# Patient Record
Sex: Male | Born: 1970
Health system: Southern US, Community
[De-identification: ages and names within clinical notes are randomized; demographics above are authoritative.]

## PROBLEM LIST (undated history)

## (undated) DIAGNOSIS — F32A Depression, unspecified: Secondary | ICD-10-CM

## (undated) DIAGNOSIS — F329 Major depressive disorder, single episode, unspecified: Secondary | ICD-10-CM

## (undated) DIAGNOSIS — R519 Headache, unspecified: Secondary | ICD-10-CM

## (undated) DIAGNOSIS — Z430 Encounter for attention to tracheostomy: Secondary | ICD-10-CM

## (undated) DIAGNOSIS — R51 Headache: Secondary | ICD-10-CM

## (undated) DIAGNOSIS — D62 Acute posthemorrhagic anemia: Secondary | ICD-10-CM

## (undated) DIAGNOSIS — B019 Varicella without complication: Secondary | ICD-10-CM

## (undated) DIAGNOSIS — Z978 Presence of other specified devices: Secondary | ICD-10-CM

## (undated) DIAGNOSIS — S069XAA Unspecified intracranial injury with loss of consciousness status unknown, initial encounter: Secondary | ICD-10-CM

## (undated) HISTORY — DX: Encounter for attention to tracheostomy: Z43.0

## (undated) HISTORY — DX: Presence of other specified devices: Z97.8

## (undated) HISTORY — DX: Major depressive disorder, single episode, unspecified: F32.9

## (undated) HISTORY — DX: Varicella without complication: B01.9

## (undated) HISTORY — DX: Headache, unspecified: R51.9

## (undated) HISTORY — PX: KNEE SURGERY: SHX244

## (undated) HISTORY — DX: Acute posthemorrhagic anemia: D62

## (undated) HISTORY — DX: Headache: R51

## (undated) HISTORY — PX: INGUINAL HERNIA REPAIR: SUR1180

## (undated) HISTORY — DX: Depression, unspecified: F32.A

---

## 2009-02-21 ENCOUNTER — Ambulatory Visit (HOSPITAL_COMMUNITY): Admission: RE | Admit: 2009-02-21 | Discharge: 2009-02-21 | Payer: Self-pay | Admitting: Family Medicine

## 2016-10-10 ENCOUNTER — Ambulatory Visit: Payer: Self-pay | Admitting: Primary Care

## 2016-10-10 ENCOUNTER — Ambulatory Visit (INDEPENDENT_AMBULATORY_CARE_PROVIDER_SITE_OTHER): Payer: Commercial Managed Care - HMO | Admitting: Primary Care

## 2016-10-10 ENCOUNTER — Encounter: Payer: Self-pay | Admitting: Primary Care

## 2016-10-10 VITALS — BP 124/78 | HR 63 | Temp 97.8°F | Ht 70.0 in | Wt 176.8 lb

## 2016-10-10 DIAGNOSIS — R079 Chest pain, unspecified: Secondary | ICD-10-CM | POA: Insufficient documentation

## 2016-10-10 LAB — CBC
HCT: 42.2 % (ref 39.0–52.0)
Hemoglobin: 14.2 g/dL (ref 13.0–17.0)
MCHC: 33.6 g/dL (ref 30.0–36.0)
MCV: 89.4 fl (ref 78.0–100.0)
PLATELETS: 254 10*3/uL (ref 150.0–400.0)
RBC: 4.73 Mil/uL (ref 4.22–5.81)
RDW: 12.7 % (ref 11.5–15.5)
WBC: 5.4 10*3/uL (ref 4.0–10.5)

## 2016-10-10 LAB — COMPREHENSIVE METABOLIC PANEL
ALBUMIN: 4.4 g/dL (ref 3.5–5.2)
ALK PHOS: 44 U/L (ref 39–117)
ALT: 24 U/L (ref 0–53)
AST: 23 U/L (ref 0–37)
BILIRUBIN TOTAL: 0.7 mg/dL (ref 0.2–1.2)
BUN: 13 mg/dL (ref 6–23)
CALCIUM: 10 mg/dL (ref 8.4–10.5)
CO2: 31 mEq/L (ref 19–32)
CREATININE: 0.8 mg/dL (ref 0.40–1.50)
Chloride: 103 mEq/L (ref 96–112)
GFR: 111.91 mL/min (ref >60.00)
Glucose, Bld: 92 mg/dL (ref 70–99)
Potassium: 5 mEq/L (ref 3.5–5.1)
Sodium: 137 mEq/L (ref 135–145)
TOTAL PROTEIN: 7.4 g/dL (ref 6.0–8.3)

## 2016-10-10 LAB — TSH: TSH: 0.61 u[IU]/mL (ref 0.35–4.50)

## 2016-10-10 NOTE — Progress Notes (Signed)
Pre visit review using our clinic review tool, if applicable. No additional management support is needed unless otherwise documented below in the visit note. 

## 2016-10-10 NOTE — Assessment & Plan Note (Addendum)
Present x 4 months, more consistent now. Could be GERD, unlikely MSK related at this point. Exam today stable. Labs pending. ECG: Sinus bradycardia at 55, no ST abnormality, no BBB. Given symptoms coupled with significant family history of heart disease, will send to cardiology for further work up.

## 2016-10-10 NOTE — Patient Instructions (Addendum)
Complete lab work prior to leaving today. I will notify you of your results once received.   You will be contacted regarding your referral to Cardiology.  Please let us know if you have not heard back within one week.   Your ECG looks good. No bundle branch block or ST abnormality.   Please go to the hospital if your chest pain gets worse, is consistent, you start feeling nauseated and breaking out in sweats.  Please schedule a physical with me in 6 months. You may also schedule a lab only appointment 3-4 days prior. We will discuss your lab results in detail during your physical.  It was a pleasure to meet you today! Please don't hesitate to call me with any questions. Welcome to [facility]!

## 2016-10-10 NOTE — Progress Notes (Signed)
Subjective:    Patient ID: Luke Choi, male    DOB: 04-16-1971, 46 y.o.   MRN: [id number]  HPI  [name] is a 46 year old male who presents today to establish care and discuss the problems mentioned below. Will obtain old records.  1) Chest Tightness/Pressure: The tightness is located to the mid left chest that has been present for the past several months. Initially his tightness was intermittent, over the last month he's noticed more persistent symptoms. He also reports tingling to his left arm and fingers. He does have a history of torn rotator cuff to his left shoulder and experiences intermittent neck pain.   His chest tightness will occur daily andoccasionally with nausea, changes in vision, and lightheadedness. His girlfriend is a paramedic who completed an ECG three weeks ago and noticed bundle branch block. His symptoms will come at rest and exertion that will last for 20-30 minutes. He does notice a heartburn feeling sometimes during these episodes, takes an antacid without improvement.   He denies diaphoresis, shortness of breath, lower extremity edema. He has a strong family history of heart disease: (mother, pacemaker, CAD) father (MI at age 44, bypass surgery, CAD). He quit smoking in his late 20's. He endorses a fair diet and does not regularly exercise. He's not had work up for this since symptoms began, he is not fasting today.   Review of Systems  Constitutional: Negative for unexpected weight change.  Respiratory: Negative for cough and shortness of breath.   Cardiovascular: Positive for chest pain.  Gastrointestinal: Positive for nausea. Negative for abdominal pain.  Musculoskeletal: Positive for arthralgias.  Neurological: Positive for dizziness and light-headedness.       Past Medical History:  Diagnosis Date  . Chickenpox   . Depression   . Frequent headaches      Social History   Social History  . Marital status: Divorced    Spouse name: N/A  . Number  of children: N/A  . Years of education: N/A   Occupational History  . Not on file.   Social History Main Topics  . Smoking status: Former Games developermoker  . Smokeless tobacco: Never Used  . Alcohol use Yes  . Drug use: Unknown  . Sexual activity: Not on file   Other Topics Concern  . Not on file   Social History Narrative   Single.   1 daughter.   Works as an Engineer, maintenanceauto technician.    Enjoys 4-wheeling, camping.    Past Surgical History:  Procedure Laterality Date  . INGUINAL HERNIA REPAIR    . KNEE SURGERY Left    6 times    Family History  Problem Relation Age of Onset  . Arthritis Mother   . Hyperlipidemia Mother   . Hypertension Mother   . Heart disease Mother   . Diabetes Mother   . Alcohol abuse Father   . Arthritis Father   . Prostate cancer Father [id number]  . Hyperlipidemia Father   . Hypertension Father   . Heart disease Father   . Diabetes Father   . Heart attack Father [phone]  . Prostate cancer Paternal Uncle   . Arthritis Maternal Grandmother   . Arthritis Maternal Grandfather   . Arthritis Paternal Grandmother   . Arthritis Paternal Grandfather     No Known Allergies  No current outpatient prescriptions on file prior to visit.   No current facility-administered medications on file prior to visit.     BP 124/78   Pulse 63  Temp 97.8 F (36.6 C) (Oral)   Ht 5\' 10"  (1.778 m)   Wt 176 lb 12.8 oz (80.2 kg)   SpO2 98%   BMI 25.37 kg/m    Objective:   Physical Exam  Constitutional: He is oriented to person, place, and time. He appears well-nourished.  Neck: Neck supple.  Cardiovascular: Normal rate, regular rhythm and normal heart sounds.   No lower extremity edema  Pulmonary/Chest: Effort normal and breath sounds normal. He has no wheezes. He has no rales.  Neurological: He is alert and oriented to person, place, and time.  Skin: Skin is warm and dry.  Psychiatric: He has a normal mood and affect.          Assessment & Plan:

## 2016-10-16 ENCOUNTER — Ambulatory Visit: Payer: Self-pay | Admitting: Primary Care

## 2016-10-30 ENCOUNTER — Ambulatory Visit (INDEPENDENT_AMBULATORY_CARE_PROVIDER_SITE_OTHER): Payer: Commercial Managed Care - HMO | Admitting: Internal Medicine

## 2016-10-30 VITALS — BP 116/64 | HR 63 | Ht 72.0 in | Wt 183.0 lb

## 2016-10-30 DIAGNOSIS — Z13220 Encounter for screening for lipoid disorders: Secondary | ICD-10-CM | POA: Insufficient documentation

## 2016-10-30 DIAGNOSIS — R079 Chest pain, unspecified: Secondary | ICD-10-CM | POA: Diagnosis not present

## 2016-10-30 DIAGNOSIS — Z8249 Family history of ischemic heart disease and other diseases of the circulatory system: Secondary | ICD-10-CM | POA: Insufficient documentation
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
## 2016-10-30 NOTE — Patient Instructions (Addendum)
Your physician has requested that you have an exercise tolerance test. For further information please visit [email or web address]. Please also follow instruction sheet, as given.  Your physician recommends that you return for lab work FASTING @ [facility]  Your physician recommends that you schedule a follow-up appointment after your stress test.

## 2016-11-14 ENCOUNTER — Telehealth (HOSPITAL_COMMUNITY): Payer: Self-pay

## 2016-11-14 NOTE — Telephone Encounter (Signed)
I did notify Rene Kocher in scheduling to cancel appointment. Encounter complete.

## 2016-11-19 ENCOUNTER — Inpatient Hospital Stay (HOSPITAL_COMMUNITY): Admission: RE | Admit: 2016-11-19 | Payer: Commercial Managed Care - HMO | Source: Ambulatory Visit

## 2016-11-21 ENCOUNTER — Ambulatory Visit: Payer: Commercial Managed Care - HMO | Admitting: Internal Medicine

## 2016-11-21 ENCOUNTER — Encounter: Payer: Self-pay | Admitting: *Deleted

## 2016-11-25 ENCOUNTER — Encounter: Payer: Self-pay | Admitting: Primary Care

## 2017-02-02 ENCOUNTER — Ambulatory Visit (INDEPENDENT_AMBULATORY_CARE_PROVIDER_SITE_OTHER)
Admission: RE | Admit: 2017-02-02 | Discharge: 2017-02-02 | Disposition: A | Discharge status: Home / Self Care | Payer: 59 | Source: Ambulatory Visit | Attending: Primary Care | Admitting: Primary Care

## 2017-02-02 ENCOUNTER — Ambulatory Visit (INDEPENDENT_AMBULATORY_CARE_PROVIDER_SITE_OTHER): Payer: 59 | Admitting: Primary Care

## 2017-02-02 ENCOUNTER — Encounter: Payer: Self-pay | Admitting: Primary Care

## 2017-02-02 VITALS — BP 122/72 | HR 67 | Temp 98.0°F | Ht 70.0 in | Wt 177.1 lb

## 2017-02-02 DIAGNOSIS — R079 Chest pain, unspecified: Secondary | ICD-10-CM | POA: Diagnosis not present

## 2017-02-02 DIAGNOSIS — G4700 Insomnia, unspecified: Secondary | ICD-10-CM | POA: Insufficient documentation

## 2017-02-02 DIAGNOSIS — R5383 Other fatigue: Secondary | ICD-10-CM | POA: Insufficient documentation

## 2017-02-02 DIAGNOSIS — R61 Generalized hyperhidrosis: Secondary | ICD-10-CM

## 2017-02-02 LAB — CBC WITH DIFFERENTIAL/PLATELET
BASOS ABS: 0.1 10*3/uL (ref 0.0–0.1)
Basophils Relative: 1.4 % (ref 0.0–3.0)
EOS ABS: 0.1 10*3/uL (ref 0.0–0.7)
Eosinophils Relative: 2.2 % (ref 0.0–5.0)
HCT: 41.2 % (ref 39.0–52.0)
Hemoglobin: 13.9 g/dL (ref 13.0–17.0)
LYMPHS ABS: 1.3 10*3/uL (ref 0.7–4.0)
Lymphocytes Relative: 21.1 % (ref 12.0–46.0)
MCHC: 33.9 g/dL (ref 30.0–36.0)
MCV: 89.5 fl (ref 78.0–100.0)
MONO ABS: 0.5 10*3/uL (ref 0.1–1.0)
Monocytes Relative: 8.9 % (ref 3.0–12.0)
NEUTROS ABS: 3.9 10*3/uL (ref 1.4–7.7)
NEUTROS PCT: 66.4 % (ref 43.0–77.0)
PLATELETS: 271 10*3/uL (ref 150.0–400.0)
RBC: 4.6 Mil/uL (ref 4.22–5.81)
RDW: 12.8 % (ref 11.5–15.5)
WBC: 5.9 10*3/uL (ref 4.0–10.5)

## 2017-02-02 LAB — LIPID PANEL
CHOL/HDL RATIO: 2
Cholesterol: 179 mg/dL (ref 0–200)
HDL: 79.3 mg/dL (ref >39.00)
LDL CALC: 87 mg/dL (ref 0–99)
NONHDL: 99.78
TRIGLYCERIDES: 63 mg/dL (ref 0.0–149.0)
VLDL: 12.6 mg/dL (ref 0.0–40.0)

## 2017-02-02 LAB — COMPREHENSIVE METABOLIC PANEL
ALT: 23 U/L (ref 0–53)
AST: 21 U/L (ref 0–37)
Albumin: 4.7 g/dL (ref 3.5–5.2)
Alkaline Phosphatase: 50 U/L (ref 39–117)
BILIRUBIN TOTAL: 0.5 mg/dL (ref 0.2–1.2)
BUN: 11 mg/dL (ref 6–23)
CO2: 31 meq/L (ref 19–32)
Calcium: 10 mg/dL (ref 8.4–10.5)
Chloride: 100 mEq/L (ref 96–112)
Creatinine, Ser: 0.87 mg/dL (ref 0.40–1.50)
GFR: 101.43 mL/min (ref >60.00)
GLUCOSE: 94 mg/dL (ref 70–99)
Potassium: 4.5 mEq/L (ref 3.5–5.1)
SODIUM: 136 meq/L (ref 135–145)
TOTAL PROTEIN: 7.4 g/dL (ref 6.0–8.3)

## 2017-02-02 LAB — HEMOGLOBIN A1C: Hgb A1c MFr Bld: 5.3 % (ref 4.6–6.5)

## 2017-02-02 MED ORDER — TRAZODONE HCL 50 MG PO TABS: 25.0000 mg | ORAL_TABLET | Freq: Every evening | ORAL | 0 refills | Status: DC | PRN

## 2017-02-02 NOTE — Assessment & Plan Note (Signed)
With night sweats, insomnia, nausea nearly every morning for years. Exam today unremarkable. Does drink a moderate amount of alcohol. Check labs and chest xray today. Will treat insomnia with low dose Trazodone, he will update if no improvement. Strongly encouraged he follow up with cardiology given family history of heart disease. Check lipids today.

## 2017-02-02 NOTE — Progress Notes (Signed)
Subjective:    Patient ID: Luke Choi, male    DOB: Jul 26, 1971, 46 y.o.   MRN: [id number]  HPI  [name] is a 46 year old male who presents today with a chief complaint of insomnia. He also reports night sweats. His symptoms began Tuesday last week and lasted until Thursday. He will wake up feeling tired, nauseated, in cold sweats. This has been intermittent for the past 2 years and has progressively increased over the past 1 year. He stopped smoking 20 years ago. He drinks 1-2, 12 ounce cans of beer 5-6 days weekly. He does still experience left sided chest pain, and occasional shortness of breath, most mornings of the week. He wakes up most everyday feeling "horrible".  He goes to bed at 10:30 pm at night, wakes between 3am-4am, sometimes he doesn't sleep at all. He's had difficulty falling and staying asleep since childhood. He's tried Ambien (no improvement), amitriptyline (felt groggy the next day), Xanax (sometimes worked), Melatonin (not effective), Aleve PM (groggy feeling the next day). He took some of his girlfriends Trazodone several days ago with improvement.   He woke up Friday morning last week, checked his blood sugar which was 107. He denies unexplained weight loss, cough, rectal bleeding, fevers, abdominal pain. He's able to eat and drink without difficulty.  He was evaluated by cardiology in March 2018 and was recommended to undergo exercise stress test which was not covered by his insurance. He has not followed back up with the cardiologist since.  Review of Systems  Constitutional: Positive for fatigue. Negative for fever and unexpected weight change.  HENT: Negative for congestion.   Respiratory: Positive for shortness of breath. Negative for cough.   Cardiovascular: Positive for chest pain.  Gastrointestinal: Positive for nausea. Negative for abdominal pain, blood in stool and vomiting.  Endocrine: Negative for polyuria.  Neurological: Negative for dizziness and  headaches.  Hematological: Negative for adenopathy.       Past Medical History:  Diagnosis Date  . Chickenpox   . Depression   . Frequent headaches      Social History   Social History  . Marital status: Divorced    Spouse name: N/A  . Number of children: N/A  . Years of education: N/A   Occupational History  . Not on file.   Social History Main Topics  . Smoking status: Former Games developer  . Smokeless tobacco: Never Used  . Alcohol use Yes  . Drug use: Unknown  . Sexual activity: Not on file   Other Topics Concern  . Not on file   Social History Narrative   Single.   1 daughter.   Works as an Engineer, maintenance.    Enjoys 4-wheeling, camping.    Past Surgical History:  Procedure Laterality Date  . INGUINAL HERNIA REPAIR    . KNEE SURGERY Left    6 times    Family History  Problem Relation Age of Onset  . Arthritis Mother   . Hyperlipidemia Mother   . Hypertension Mother   . Heart disease Mother   . Diabetes Mother   . Alcohol abuse Father   . Arthritis Father   . Prostate cancer Father 75  . Hyperlipidemia Father   . Hypertension Father   . Heart disease Father   . Diabetes Father   . Heart attack Father 47  . Prostate cancer Paternal Uncle   . Arthritis Maternal Grandmother   . Arthritis Maternal Grandfather   . Arthritis Paternal Grandmother   .  Arthritis Paternal Grandfather     No Known Allergies  No current outpatient prescriptions on file prior to visit.   No current facility-administered medications on file prior to visit.     BP 122/72   Pulse 67   Temp 98 F (36.7 C) (Oral)   Ht 5\' 10"  (1.778 m)   Wt 177 lb 1.9 oz (80.3 kg)   SpO2 98%   BMI 25.41 kg/m    Objective:   Physical Exam  Constitutional: He appears well-nourished.  Neck: Neck supple.  Cardiovascular: Normal rate and regular rhythm.   Pulmonary/Chest: Effort normal and breath sounds normal.  Abdominal: Soft. Bowel sounds are normal. There is no tenderness.  Skin:  Skin is warm and dry.  Psychiatric: He has a normal mood and affect.          Assessment & Plan:

## 2017-02-02 NOTE — Patient Instructions (Addendum)
Complete lab work and xray prior to leaving today. I will notify you of your results once received.   Please call the cardiologists office and explain that you continue to experience your symptoms.  Start trazodone tablets as needed for insomnia. Take 1/2 to 1 tablet by mouth at bedtime as needed for insomnia. Please notify me if no improvement in 2 weeks.  It was a pleasure to see you today!

## 2017-02-02 NOTE — Assessment & Plan Note (Signed)
Continues intermittently. Evaluated by cardiology, insurance wouldn't cover exercise stress test. Recommended he contact his cardiologist to learn if there are payment plans or any other options.  Check labs and chest xray today.

## 2017-02-02 NOTE — Assessment & Plan Note (Signed)
Long history of since childhood and has failed numerous treatments in the past. Discussed good bedtime hygiene. Will start low dose Trazodone, advance as tolerated. He will update if no improvement.

## 2017-02-03 ENCOUNTER — Encounter: Payer: Self-pay | Admitting: Primary Care

## 2017-02-03 LAB — HIV ANTIBODY (ROUTINE TESTING W REFLEX): HIV: NONREACTIVE

## 2017-02-03 NOTE — Telephone Encounter (Signed)
Please set patient up for nurse visit for spirometry both pre and post albuterol.

## 2017-02-05 ENCOUNTER — Ambulatory Visit (INDEPENDENT_AMBULATORY_CARE_PROVIDER_SITE_OTHER): Payer: 59 | Admitting: *Deleted

## 2017-02-05 DIAGNOSIS — R5383 Other fatigue: Secondary | ICD-10-CM

## 2017-02-05 MED ORDER — ALBUTEROL SULFATE (2.5 MG/3ML) 0.083% IN NEBU
2.5000 mg | INHALATION_SOLUTION | Freq: Once | RESPIRATORY_TRACT | Status: AC
  Administered 2017-02-05: 2.5 mg via RESPIRATORY_TRACT

## 2017-02-05 NOTE — Telephone Encounter (Signed)
Nurse visit on 02/05/2017

## 2017-02-09 ENCOUNTER — Encounter: Payer: Self-pay | Admitting: Primary Care

## 2017-02-09 DIAGNOSIS — R071 Chest pain on breathing: Secondary | ICD-10-CM

## 2017-02-10 ENCOUNTER — Observation Stay (HOSPITAL_COMMUNITY)
Admission: EM | Admit: 2017-02-10 | Discharge: 2017-02-11 | Disposition: A | Discharge status: Home / Self Care | Payer: 59 | Attending: Cardiology | Admitting: Cardiology

## 2017-02-10 ENCOUNTER — Emergency Department (HOSPITAL_COMMUNITY): Payer: 59

## 2017-02-10 ENCOUNTER — Encounter (HOSPITAL_COMMUNITY): Payer: Self-pay | Admitting: Emergency Medicine

## 2017-02-10 DIAGNOSIS — R0602 Shortness of breath: Secondary | ICD-10-CM | POA: Insufficient documentation

## 2017-02-10 DIAGNOSIS — R001 Bradycardia, unspecified: Secondary | ICD-10-CM | POA: Diagnosis not present

## 2017-02-10 DIAGNOSIS — R0789 Other chest pain: Secondary | ICD-10-CM

## 2017-02-10 DIAGNOSIS — Z87891 Personal history of nicotine dependence: Secondary | ICD-10-CM | POA: Insufficient documentation

## 2017-02-10 DIAGNOSIS — R079 Chest pain, unspecified: Secondary | ICD-10-CM | POA: Diagnosis present

## 2017-02-10 DIAGNOSIS — R200 Anesthesia of skin: Secondary | ICD-10-CM | POA: Insufficient documentation

## 2017-02-10 LAB — BASIC METABOLIC PANEL
ANION GAP: 8 (ref 5–15)
BUN: 10 mg/dL (ref 6–20)
CO2: 25 mmol/L (ref 22–32)
Calcium: 9.1 mg/dL (ref 8.9–10.3)
Chloride: 104 mmol/L (ref 101–111)
Creatinine, Ser: 0.8 mg/dL (ref 0.61–1.24)
GLUCOSE: 103 mg/dL — AB (ref 65–99)
POTASSIUM: 4.1 mmol/L (ref 3.5–5.1)
Sodium: 137 mmol/L (ref 135–145)

## 2017-02-10 LAB — I-STAT TROPONIN, ED
TROPONIN I, POC: 0 ng/mL (ref 0.00–0.08)
Troponin i, poc: 0 ng/mL (ref 0.00–0.08)

## 2017-02-10 LAB — CBC
HCT: 39.6 % (ref 39.0–52.0)
HEMATOCRIT: 41.2 % (ref 39.0–52.0)
HEMOGLOBIN: 13.6 g/dL (ref 13.0–17.0)
HEMOGLOBIN: 13 g/dL (ref 13.0–17.0)
MCH: 29.2 pg (ref 26.0–34.0)
MCH: 29.1 pg (ref 26.0–34.0)
MCHC: 33 g/dL (ref 30.0–36.0)
MCHC: 32.8 g/dL (ref 30.0–36.0)
MCV: 88.4 fL (ref 78.0–100.0)
MCV: 88.6 fL (ref 78.0–100.0)
Platelets: 261 10*3/uL (ref 150–400)
Platelets: 251 10*3/uL (ref 150–400)
RBC: 4.66 MIL/uL (ref 4.22–5.81)
RBC: 4.47 MIL/uL (ref 4.22–5.81)
RDW: 12.5 % (ref 11.5–15.5)
RDW: 12.4 % (ref 11.5–15.5)
WBC: 4.9 10*3/uL (ref 4.0–10.5)
WBC: 6.3 10*3/uL (ref 4.0–10.5)

## 2017-02-10 LAB — CREATININE, SERUM
Creatinine, Ser: 0.74 mg/dL (ref 0.61–1.24)
GFR calc Af Amer: >60 mL/min (ref >60)
GFR calc non Af Amer: >60 mL/min (ref >60)

## 2017-02-10 LAB — PROTIME-INR
INR: 1.07
Prothrombin Time: 13.9 seconds (ref 11.4–15.2)

## 2017-02-10 LAB — TROPONIN I

## 2017-02-10 MED ORDER — SODIUM CHLORIDE 0.9% FLUSH: 3.0000 mL | Freq: Two times a day (BID) | INTRAVENOUS | Status: DC

## 2017-02-10 MED ORDER — ENOXAPARIN SODIUM 40 MG/0.4ML ~~LOC~~ SOLN
40.0000 mg | SUBCUTANEOUS | Status: DC
  Administered 2017-02-10: 40 mg via SUBCUTANEOUS
  Filled 2017-02-10: qty 0.4

## 2017-02-10 MED ORDER — NITROGLYCERIN 0.4 MG SL SUBL
0.4000 mg | SUBLINGUAL_TABLET | SUBLINGUAL | Status: DC | PRN
Start: 2017-02-10 — End: 2017-02-11

## 2017-02-10 MED ORDER — SODIUM CHLORIDE 0.9% FLUSH: 3.0000 mL | INTRAVENOUS | Status: DC | PRN

## 2017-02-10 MED ORDER — ASPIRIN EC 81 MG PO TBEC
81.0000 mg | DELAYED_RELEASE_TABLET | Freq: Every day | ORAL | Status: DC
  Administered 2017-02-11: 81 mg via ORAL
  Filled 2017-02-10: qty 1

## 2017-02-10 MED ORDER — SODIUM CHLORIDE 0.9 % IV SOLN: 250.0000 mL | INTRAVENOUS | Status: DC | PRN

## 2017-02-10 MED ORDER — ACETAMINOPHEN 325 MG PO TABS
650.0000 mg | ORAL_TABLET | ORAL | Status: DC | PRN
  Administered 2017-02-10: 650 mg via ORAL
  Filled 2017-02-10: qty 2

## 2017-02-10 MED ORDER — ONDANSETRON HCL 4 MG/2ML IJ SOLN: 4.0000 mg | Freq: Four times a day (QID) | INTRAMUSCULAR | Status: DC | PRN

## 2017-02-10 MED ORDER — TRAZODONE HCL 50 MG PO TABS
25.0000 mg | ORAL_TABLET | Freq: Every evening | ORAL | Status: DC | PRN
  Filled 2017-02-10: qty 1

## 2017-02-10 NOTE — ED Provider Notes (Signed)
[facility]-EMERGENCY DEPT Provider Note   CSN: [id number] Arrival date & time: 02/10/17  0808     History   Chief Complaint Chief Complaint  Patient presents with  . Chest Pain  . Shortness of Breath  . Numbness    in left arm.    HPI Luke Choi is a 46 y.o. male.  HPI  Pt presenting with c/o chest pain.  Pt states that he has been having chest pain intermitently over the past 6 months.  Was seen by Dr. Rennis GoldenHilty, cardiology, who recommended stress testing. Pt states insurance would not cover this test.  This morning he was at work and carrying a heavy cooler and felt sharp chest pain. He also was diaphoretic and felt faint.  He also had some trouble catching his breath.  No leg swelling. No fainting.  No radiation of pain.  No hx of DVT/PE, no recent travel/trauma/surgery.  There are no other associated systemic symptoms, there are no other alleviating or modifying factors.   Past Medical History:  Diagnosis Date  . Chickenpox   . Depression   . Frequent headaches     Patient Active Problem List   Diagnosis Date Noted  . Junctional bradycardia 02/11/2017  . Fatigue 02/02/2017  . Insomnia 02/02/2017  . Family history of heart disease 10/30/2016  . Screening for lipid disorders 10/30/2016  . Chest pain 10/10/2016    Past Surgical History:  Procedure Laterality Date  . INGUINAL HERNIA REPAIR    . KNEE SURGERY Left    6 times       Home Medications    Prior to Admission medications   Medication Sig Start Date End Date Taking? Authorizing Provider  naproxen sodium (ALEVE) 220 MG tablet Take 220 mg by mouth daily as needed (pain).   Yes [provider]  traZODone (DESYREL) 50 MG tablet Take 0.5-1 tablets (25-50 mg total) by mouth at bedtime as needed for sleep. 02/02/17  Yes Doreene Nestlark, Katherine K, NP  aspirin EC 81 MG EC tablet Take 1 tablet (81 mg total) by mouth daily. 02/11/17   Barrett, Joline Salthonda G, PA-C  nitroGLYCERIN (NITROSTAT) 0.4 MG SL tablet Place 1 tablet (0.4 mg  total) under the tongue every 5 (five) minutes x 3 doses as needed for chest pain. 02/11/17   Barrett, Joline Salthonda G, PA-C    Family History Family History  Problem Relation Age of Onset  . Arthritis Mother   . Hyperlipidemia Mother   . Hypertension Mother   . Heart disease Mother   . Diabetes Mother   . Alcohol abuse Father   . Arthritis Father   . Prostate cancer Father [id number]  . Hyperlipidemia Father   . Hypertension Father   . Heart disease Father   . Diabetes Father   . Heart attack Father 1741  . Prostate cancer Paternal Uncle   . Arthritis Maternal Grandmother   . Arthritis Maternal Grandfather   . Arthritis Paternal Grandmother   . Arthritis Paternal Grandfather     Social History Social History  Substance Use Topics  . Smoking status: Former Games developermoker  . Smokeless tobacco: Never Used  . Alcohol use Yes     Allergies   Patient has no known allergies.   Review of Systems Review of Systems  ROS reviewed and all otherwise negative except for mentioned in HPI   Physical Exam Updated Vital Signs BP (!) 95/53 (BP Location: Right Arm)   Pulse 67   Temp 98.2 F (36.8 C) (Oral)  Resp 20   Ht 5\' 10"  (1.778 m)   Wt 80.3 kg (177 lb)   SpO2 99%   BMI 25.40 kg/m  Vitals reviewed Physical Exam  Physical Examination: General appearance - alert, well appearing, and in no distress Mental status - alert, oriented to person, place, and time Eyes - no conjunctival injection no scleral icterus Mouth - mucous membranes moist, pharynx normal without lesions Chest - clear to auscultation, no wheezes, rales or rhonchi, symmetric air entry Heart - normal rate, regular rhythm, normal S1, S2, no murmurs, rubs, clicks or gallops Abdomen - soft, nontender, nondistended, no masses or organomegaly Neurological - alert, oriented, normal speech Extremities - peripheral pulses normal, no pedal edema, no clubbing or cyanosis Skin - normal coloration and turgor, no rashes   ED Treatments /  Results  Labs (all labs ordered are listed, but only abnormal results are displayed) Labs Reviewed  BASIC METABOLIC PANEL - Abnormal; Notable for the following:       Result Value   Glucose, Bld 103 (*)    All other components within normal limits  CBC  PROTIME-INR  CBC  CREATININE, SERUM  CBC  PROTIME-INR  BASIC METABOLIC PANEL  TROPONIN I  TROPONIN I  TROPONIN I  I-STAT TROPOININ, ED  I-STAT TROPOININ, ED    EKG  EKG Interpretation  Date/Time:  Tuesday February 10 2017 08:10:58 EDT Ventricular Rate:  82 PR Interval:  138 QRS Duration: 86 QT Interval:  380 QTC Calculation: 443 R Axis:   83 Text Interpretation:  Normal sinus rhythm Septal infarct , age undetermined Abnormal ECG No old tracing to compare Confirmed by Jerelyn Scott [phone]) on 02/10/2017 9:33:43 AM       Radiology No results found.  Procedures Procedures (including critical care time)  Medications Ordered in ED Medications  technetium tetrofosmin (TC-MYOVIEW) injection 10 millicurie (10 millicuries Intravenous Contrast Given 02/11/17 0900)  nitroGLYCERIN (NITROSTAT) SL tablet 0.4 mg (0.4 mg Sublingual Given 02/11/17 1047)  technetium tetrofosmin (TC-MYOVIEW) injection 30 millicurie (30 millicuries Intravenous Contrast Given 02/11/17 1040)     Initial Impression / Assessment and Plan / ED Course  I have reviewed the triage vital signs and the nursing notes.  Pertinent labs & imaging results that were available during my care of the patient were reviewed by me and considered in my medical decision making (see chart for details).    10:38 AM d/w cardiology, they will consult on patient in the ED.   Pt presenting with ongoing episdoes of chest pain over the past few months.  EKG and troponin reassuring today.  D/w cardiology- they have consulted and plan to admit for further testing.    Final Clinical Impressions(s) / ED Diagnoses   Final diagnoses:  Chest pain    New Prescriptions Discharge  Medication List as of 02/11/2017  5:20 PM    START taking these medications   Details  aspirin EC 81 MG EC tablet Take 1 tablet (81 mg total) by mouth daily., Starting Wed 02/11/2017, OTC    nitroGLYCERIN (NITROSTAT) 0.4 MG SL tablet Place 1 tablet (0.4 mg total) under the tongue every 5 (five) minutes x 3 doses as needed for chest pain., Starting Wed 02/11/2017, Normal         Jerelyn Scott, MD 02/14/17 1327

## 2017-02-10 NOTE — H&P (Signed)
Patient ID: Luke Choi MRN: [id number], DOB/AGE: 44-Oct-1972   Admit date: 02/10/2017   Primary Physician: Doreene Nest, NP Primary Cardiologist: Dr. Rennis Golden  Reason for admission: chest pain   HPI:  Luke Choi is a 46 y.o. male with a history of former tobacco abuse and strong family history of CAD who presented to [facility] ED today with chest pain.  He has a strong family history of heart disease. His father had his first MI in his 22s. He has about a 7 pack year history of smoking and quit about 20 years ago. 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   He was seen by Dr. Rennis Golden in the office on 10/30/16 for evaluation of chest pain. His girlfriend is a paramedic and previously had performed an EKG on him which showed a right bundle branch block. However, subsequent EKGs have not shown a right bundle branch block. Dr. Rennis Golden felt his pain had typical and atypical features. He also felt that there was possibly some stress from family issues that may be contributing. He had tried treatment for GERD but did not notice any improvement. He ordered a plain old exercise stress test, which has not been completed yet. Fasting lipid panel showed LDL cholesterol of 87.  Patient has continued to have intermittent chest pain since seeing Dr. Rennis Golden. He said that his insurance would not cover the stress test so he was unable to get it done. He has had chest pain for the past couple years but it has been worsening in frequency and severity. This morning he was pushing a cooler up an incline and had sudden onset of central sharp chest pain associated with diaphoresis and SOB. He also notes waking up in the mornings just not feeling well and having cold sweats. He denies LE edema, orthopnea or PND. No dizziness or syncope. No palpitations.    Problem List  Past Medical History:  Diagnosis Date  . Chickenpox   . Depression   . Frequent headaches     Past Surgical History:    Procedure Laterality Date  . INGUINAL HERNIA REPAIR    . KNEE SURGERY Left    6 times     Allergies  No Known Allergies   Home Medications  Prior to Admission medications   Medication Sig Start Date End Date Taking? Authorizing Provider  naproxen sodium (ALEVE) 220 MG tablet Take 220 mg by mouth daily as needed (pain).   Yes [provider]  traZODone (DESYREL) 50 MG tablet Take 0.5-1 tablets (25-50 mg total) by mouth at bedtime as needed for sleep. 02/02/17  Yes Doreene Nest, NP    Family History  Family History  Problem Relation Age of Onset  . Arthritis Mother   . Hyperlipidemia Mother   . Hypertension Mother   . Heart disease Mother   . Diabetes Mother   . Alcohol abuse Father   . Arthritis Father   . Prostate cancer Father 10  . Hyperlipidemia Father   . Hypertension Father   . Heart disease Father   . Diabetes Father   . Heart attack Father 47  . Prostate cancer Paternal Uncle   . Arthritis Maternal Grandmother   . Arthritis Maternal Grandfather   . Arthritis Paternal Grandmother   . Arthritis Paternal Grandfather    Family Status  Relation Status  . Mother (Not Specified)  . Father (Not Specified)  . Oneal Grout (Not Specified)  .  MGM (Not Specified)  . MGF (Not Specified)  . PGM (Not Specified)  . PGF (Not Specified)     Social History  Social History   Social History  . Marital status: Divorced    Spouse name: N/A  . Number of children: N/A  . Years of education: N/A   Occupational History  . Not on file.   Social History Main Topics  . Smoking status: Former Games developer  . Smokeless tobacco: Never Used  . Alcohol use Yes  . Drug use: Unknown  . Sexual activity: Not on file   Other Topics Concern  . Not on file   Social History Narrative   Single.   1 daughter.   Works as an Engineer, maintenance.    Enjoys 4-wheeling, camping.     Review of Systems General:  No chills, fever, +++night sweats , no weight changes.   Cardiovascular:  +++ chest pain, +++dyspnea on exertion, NO edema, orthopnea, palpitations, paroxysmal nocturnal dyspnea. Dermatological: No rash, lesions/masses Respiratory: No cough, dyspnea Urologic: No hematuria, dysuria Abdominal:   +++nausea, No vomiting, diarrhea, bright red blood per rectum, melena, or hematemesis Neurologic:  No visual changes, wkns, changes in mental status. All other systems reviewed and are otherwise negative except as noted above.  Physical Exam  Blood pressure 126/81, pulse (!) 59, resp. rate 16, height 5\' 10"  (1.778 m), weight 177 lb (80.3 kg), SpO2 100 %.  General: Pleasant, NAD Psych: Normal affect. Neuro: Alert and oriented X 3. Moves all extremities spontaneously. HEENT: Normal  Neck: Supple without bruits or JVD. Lungs:  Resp regular and unlabored, CTA. Heart: RRR no s3, s4, or murmurs. Abdomen: Soft, non-tender, non-distended, BS + x 4.  Extremities: No clubbing, cyanosis or edema. DP/PT/Radials 2+ and equal bilaterally.  Labs  No results for input(s): CKTOTAL, CKMB, TROPONINI in the last 72 hours. Lab Results  Component Value Date   WBC 4.9 02/10/2017   HGB 13.6 02/10/2017   HCT 41.2 02/10/2017   MCV 88.4 02/10/2017   PLT 261 02/10/2017    Recent Labs Lab 02/10/17 0823  NA 137  K 4.1  CL 104  CO2 25  BUN 10  CREATININE 0.80  CALCIUM 9.1  GLUCOSE 103*   Lab Results  Component Value Date   CHOL 179 02/02/2017   HDL 79.30 02/02/2017   LDLCALC 87 02/02/2017   TRIG 63.0 02/02/2017   No results found for: DDIMER   Radiology/Studies  Dg Chest 2 View  Result Date: 02/10/2017 CLINICAL DATA:  Shortness of breath and chest pain EXAM: CHEST  2 VIEW COMPARISON:  February 02, 2017 FINDINGS: Lungs are clear. The heart size and pulmonary vascularity are normal. No adenopathy. No bone lesions. No pneumothorax. IMPRESSION: No edema or consolidation. Electronically Signed   By: Bretta Bang III M.D.   On: 02/10/2017 08:44   Dg Chest 2  View  Result Date: 02/02/2017 CLINICAL DATA:  Chest pain and shortness of breath for 2 years, intermittent but worse now, former smoker EXAM: CHEST  2 VIEW COMPARISON:  02/21/2009 FINDINGS: Normal heart size, mediastinal contours, and pulmonary vascularity. Central peribronchial thickening and hyperinflation question COPD. No acute infiltrate, pleural effusion or pneumothorax. Osseous structures unremarkable. IMPRESSION: Chronic bronchitic changes and hyperinflation question COPD. No acute infiltrate. Electronically Signed   By: Ulyses Southward M.D.   On: 02/02/2017 11:25    ECG  NSR HR 82, possible anterior Q waves.  ASSESSMENT AND PLAN  Chest pain: sharp exertional chest pain. Also worse with  a deep breath in. Pretty atypical. No objective signs of ischemia with negative cardiac enzymes and non acute ECG. Will plan for overnight admission rule out and ETT myoview in the morning.    Signed, Luke CrockKathryn Thompson, PA-C 02/10/2017, 10:29 AM  Pager [phone] As above, pt seen and examined; briefly he is a 46 yo male for evaluation of CP. Patient has had occasional pain in his left breast area for 3 years. It is described as a pressure and can last all day at times. It can also be burning sensation at time. It is not exertional, pleuritic or related to food. There is associated dyspnea. Resolves spontaneously. Today while pushing a cart he developed sudden onset of severe pain in the substernal area with tingling in his left upper extremity. There was nausea but no vomiting. There is dyspnea as well. The pain lasted minutes and resolved. He is presently pain-free. He denies orthopnea, PND or pedal edema. Electrocardiogram shows sinus rhythm with no ST changes. Initial enzymes negative.  1 chest pain-symptoms are atypical but family history of coronary disease in his father who had a myocardial infarction in his 4440s. We will admit and cycle enzymes. If negative plan stress nuclear study for risk  stratification.  Luke MillersBrian Crenshaw, MD

## 2017-02-10 NOTE — ED Notes (Signed)
Report given at 3W

## 2017-02-10 NOTE — ED Triage Notes (Signed)
Pt. Stated, I was walking up to work with a cooler and I felt some sharp pain in my chest and could not catch my breath. I've had several episodes of having chest pain but this morning it was really sharp

## 2017-02-10 NOTE — ED Notes (Signed)
Patient family requesting to speak wih EDP. EDp made aware.

## 2017-02-10 NOTE — Plan of Care (Signed)
Problem: Skin Integrity: Goal: Risk for impaired skin integrity will decrease Outcome: Progressing Skin assessed, no areas of redness or breakdown noted.  Patient is able to reposition self and does so frequently.  Patient understands to report any changes in skin integrity to nurse.  Patient progressing towards goal.

## 2017-02-11 ENCOUNTER — Observation Stay (HOSPITAL_BASED_OUTPATIENT_CLINIC_OR_DEPARTMENT_OTHER): Payer: 59

## 2017-02-11 DIAGNOSIS — Z87891 Personal history of nicotine dependence: Secondary | ICD-10-CM | POA: Diagnosis not present

## 2017-02-11 DIAGNOSIS — R200 Anesthesia of skin: Secondary | ICD-10-CM | POA: Diagnosis not present

## 2017-02-11 DIAGNOSIS — R079 Chest pain, unspecified: Secondary | ICD-10-CM | POA: Diagnosis not present

## 2017-02-11 DIAGNOSIS — R001 Bradycardia, unspecified: Secondary | ICD-10-CM | POA: Diagnosis not present

## 2017-02-11 DIAGNOSIS — R0602 Shortness of breath: Secondary | ICD-10-CM | POA: Diagnosis not present

## 2017-02-11 LAB — BASIC METABOLIC PANEL
Anion gap: 6 (ref 5–15)
BUN: 12 mg/dL (ref 6–20)
CALCIUM: 9 mg/dL (ref 8.9–10.3)
CO2: 24 mmol/L (ref 22–32)
Chloride: 106 mmol/L (ref 101–111)
Creatinine, Ser: 0.77 mg/dL (ref 0.61–1.24)
GFR calc Af Amer: >60 mL/min (ref >60)
GLUCOSE: 91 mg/dL (ref 65–99)
Potassium: 3.9 mmol/L (ref 3.5–5.1)
Sodium: 136 mmol/L (ref 135–145)

## 2017-02-11 LAB — NM MYOCAR MULTI W/SPECT W/WALL MOTION / EF
CHL CUP MPHR: 177 {beats}/min
CSEPEDS: 22 s
CSEPEW: 10.3 METS
CSEPHR: 92 %
CSEPPHR: 164 {beats}/min
Exercise duration (min): 8 min
RPE: 16
Rest HR: 68 {beats}/min

## 2017-02-11 LAB — CBC
HCT: 39.5 % (ref 39.0–52.0)
Hemoglobin: 13.2 g/dL (ref 13.0–17.0)
MCH: 29.9 pg (ref 26.0–34.0)
MCHC: 33.4 g/dL (ref 30.0–36.0)
MCV: 89.4 fL (ref 78.0–100.0)
PLATELETS: 245 10*3/uL (ref 150–400)
RBC: 4.42 MIL/uL (ref 4.22–5.81)
RDW: 12.5 % (ref 11.5–15.5)
WBC: 6.6 10*3/uL (ref 4.0–10.5)

## 2017-02-11 LAB — PROTIME-INR
INR: 1.1
PROTHROMBIN TIME: 14.3 s (ref 11.4–15.2)

## 2017-02-11 LAB — TROPONIN I
Troponin I: <0.03 ng/mL (ref <0.03)
Troponin I: <0.03 ng/mL (ref <0.03)

## 2017-02-11 MED ORDER — TECHNETIUM TC 99M TETROFOSMIN IV KIT
30.0000 | PACK | Freq: Once | INTRAVENOUS | Status: AC | PRN
  Administered 2017-02-11: 30 via INTRAVENOUS

## 2017-02-11 MED ORDER — TECHNETIUM TC 99M TETROFOSMIN IV KIT
10.0000 | PACK | Freq: Once | INTRAVENOUS | Status: AC | PRN
  Administered 2017-02-11: 10 via INTRAVENOUS

## 2017-02-11 MED ORDER — NITROGLYCERIN 0.4 MG SL SUBL
SUBLINGUAL_TABLET | SUBLINGUAL | Status: AC
  Administered 2017-02-11: 0.4 mg via SUBLINGUAL
  Filled 2017-02-11: qty 1

## 2017-02-11 MED ORDER — NITROGLYCERIN 0.4 MG SL SUBL
0.4000 mg | SUBLINGUAL_TABLET | Freq: Once | SUBLINGUAL | Status: AC
  Administered 2017-02-11: 0.4 mg via SUBLINGUAL

## 2017-02-11 MED ORDER — ASPIRIN 81 MG PO TBEC: 81.0000 mg | DELAYED_RELEASE_TABLET | Freq: Every day | ORAL | Status: DC

## 2017-02-11 MED ORDER — NITROGLYCERIN 0.4 MG SL SUBL: 0.4000 mg | SUBLINGUAL_TABLET | SUBLINGUAL | 12 refills | Status: DC | PRN

## 2017-02-11 NOTE — Discharge Summary (Signed)
Discharge Summary    Patient ID: Luke BustleJason Siever,  MRN: [id number], DOB/AGE: Jun 13, 1971 46 y.o.  Admit date: 02/10/2017 Discharge date: 02/11/2017  Primary Care Provider: Doreene Nestlark, Katherine K Primary Cardiologist: Dr Rennis GoldenHilty  Discharge Diagnoses    Principal Problem:   Chest pain Active Problems:   Junctional bradycardia   Allergies No Known Allergies  Diagnostic Studies/Procedures    Dg Chest 2 View Result Date: 02/10/2017 CLINICAL DATA:  Shortness of breath and chest pain EXAM: CHEST  2 VIEW COMPARISON:  February 02, 2017 FINDINGS: Lungs are clear. The heart size and pulmonary vascularity are normal. No adenopathy. No bone lesions. No pneumothorax. IMPRESSION: No edema or consolidation. Electronically Signed   By: Bretta BangWilliam  Woodruff III M.D.   On: 02/10/2017 08:44   Nm Myocar Multi W/spect W/wall Motion / Ef Result Date: 02/11/2017 CLINICAL DATA:  Chest pain EXAM: MYOCARDIAL IMAGING WITH SPECT (REST AND EXERCISE) GATED LEFT VENTRICULAR WALL MOTION STUDY LEFT VENTRICULAR EJECTION FRACTION TECHNIQUE: Standard myocardial SPECT imaging was performed after resting intravenous injection of 10 mCi Tc-173m tetrofosmin. Subsequently, exercise tolerance test was performed by the patient under the supervision of the Cardiology staff. At peak-stress, 30 mCi Tc-8073m tetrofosmin was injected intravenously and standard myocardial SPECT imaging was performed. Quantitative gated imaging was also performed to evaluate left ventricular wall motion, and estimate left ventricular ejection fraction. COMPARISON:  None. FINDINGS: Perfusion: Decreased activity is noted in the inferior wall on both rest and stress images. This segment moves and thickens normally compatible with attenuation. No reversibility to suggest ischemia. Wall Motion: Normal left ventricular wall motion. Mild left ventricular dilatation. Left Ventricular Ejection Fraction: 50 % End diastolic volume 152 ml End systolic volume 77 ml IMPRESSION: 1. No  reversible ischemia or infarction.  Inferior wall attenuation. 2. Normal left ventricular wall motion. Mildly dilated left ventricle. 3. Left ventricular ejection fraction 50% 4. Non invasive risk stratification*: Low *2012 Appropriate Use Criteria for Coronary Revascularization Focused Update: J Am Coll Cardiol. 2012;59(9):857-881. http://content.dementiazones.comonlinejacc.org/article.aspx?articleid=1201161 Electronically Signed   By: Charlett NoseKevin  Dover M.D.   On: 02/11/2017 14:53   _____________   History of Present Illness     Luke BustleJason Slater is a 46 y.o. male with a history of former tobacco abuse and strong family history of CAD who [facility] today with chest pain.  He has a strong family history of heart disease. His father had his first MI in his 3640s. He has about a 7 pack year history of smoking and quit about 20 years ago. 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   He was seen by Dr. Rennis GoldenHilty in the office on 10/30/16 for evaluation of chest pain. His girlfriend is a paramedic and previously had performed an EKG on him which showed a right bundle branch block. However, subsequent EKGs have not shown a right bundle branch block. Dr. Rennis GoldenHilty felt his pain had typical and atypical features. He also felt that there was possibly some stress from family issues that may be contributing. He had tried treatment for GERD but did not notice any improvement. He ordered a plain old exercise stress test, which has not been completed yet. Fasting lipid panel showed LDL cholesterol of 87.  Patient has continued to have intermittent chest pain since seeing Dr. Rennis GoldenHilty. He said that his insurance would not cover the stress test so he was unable to get it done. He has had chest pain for the past couple years but  it has been worsening in frequency and severity. This morning he was pushing a cooler up an incline and had sudden onset of central sharp chest pain associated with diaphoresis and SOB. He also  notes waking up in the mornings just not feeling well and having cold sweats. He denies LE edema, orthopnea or PND. No dizziness or syncope. No palpitations.   Hospital Course     Consultants: none   Cardiac enzymes were negative for MI. A stress MV was performed, results above. He had some CP on the treadmill, relieved by SL NTG x 1. ECGs were reviewed by Dr Tenny Craw and the test was read by Radiology. There were no ischemic ECG changes and his images showed inferior attenuation, but no ischemia or infarction.   He was having junctional bradycardia but was not on any rate-lowering rx and was asymptomatic. HR 40s. This can be followed after discharge. Dr Blanchie Dessert note mentions that he may need a monitor after discharge.  After the MV, he was ambulating without chest pain or SOB. No further workup was indicated and he was considered stable for discharge, to follow up as and outpatient. _____________  Discharge Vitals Blood pressure (!) 95/53, pulse 67, temperature 98.2 F (36.8 C), temperature source Oral, resp. rate 20, height 5\' 10"  (1.778 m), weight 177 lb (80.3 kg), SpO2 99 %.  Filed Weights   02/10/17 0814  Weight: 177 lb (80.3 kg)    Labs & Radiologic Studies    CBC  Recent Labs  02/10/17 1235 02/11/17 0011  WBC 6.3 6.6  HGB 13.0 13.2  HCT 39.6 39.5  MCV 88.6 89.4  PLT 251 245   Basic Metabolic Panel  Recent Labs  02/10/17 0823 02/10/17 1235 02/11/17 0011  NA 137  --  136  K 4.1  --  3.9  CL 104  --  106  CO2 25  --  24  GLUCOSE 103*  --  91  BUN 10  --  12  CREATININE 0.80 0.74 0.77  CALCIUM 9.1  --  9.0   Cardiac Enzymes  Recent Labs  02/10/17 1956 02/11/17 0011 02/11/17 0719  TROPONINI <0.03 <0.03 <0.03   _____________   Disposition   Pt is being discharged home today in good condition.  Follow-up Plans & Appointments    Follow-up Information    Hilty, Lisette Abu, MD Follow up.   Specialty:  Cardiology Why:  The office will call. Contact  information: [address] [place] [address] [phone]          Discharge Instructions    Diet - low sodium heart healthy    Complete by:  As directed    Increase activity slowly    Complete by:  As directed       Discharge Medications   Current Discharge Medication List    START taking these medications   Details  aspirin EC 81 MG EC tablet Take 1 tablet (81 mg total) by mouth daily.    nitroGLYCERIN (NITROSTAT) 0.4 MG SL tablet Place 1 tablet (0.4 mg total) under the tongue every 5 (five) minutes x 3 doses as needed for chest pain. Qty: 25 tablet, Refills: 12      CONTINUE these medications which have NOT CHANGED   Details  naproxen sodium (ALEVE) 220 MG tablet Take 220 mg by mouth daily as needed (pain).    traZODone (DESYREL) 50 MG tablet Take 0.5-1 tablets (25-50 mg total) by mouth at bedtime as needed for sleep.  Qty: 30 tablet, Refills: 0   Associated Diagnoses: Insomnia, unspecified type          Outstanding Labs/Studies   None  Duration of Discharge Encounter   Greater than 30 minutes including physician time.  Melida Quitter NP 02/11/2017, 3:21 PM

## 2017-02-11 NOTE — Progress Notes (Signed)
DAILY PROGRESS NOTE   Patient Name: Luke Choi Date of Encounter: 02/11/2017  Hospital Problem List   Active Problems:   Chest pain    Chief Complaint   No further chest pain  Subjective   Troponins negative. EKG noted to be a junctional bradycardia at 49 this am. He seems asymptomatic with this - walking around.  Objective   Vitals:   02/10/17 1200 02/10/17 1233 02/10/17 2013 02/11/17 0655  BP: 115/81 114/78 108/64 116/80  Pulse: (!) 50 (!) 51 (!) 52 (!) 52  Resp: _0 Temp:  98.2 F (36.8 C) 98.3 F (36.8 C)   TempSrc:  Oral Oral   SpO2: 100% 99% 100% 100%  Weight:      Height:       No intake or output data in the 24 hours ending 02/11/17 0812 Filed Weights   02/10/17 0814  Weight: 177 lb (80.3 kg)    Physical Exam   General appearance: alert and no distress Lungs: clear to auscultation bilaterally Heart: regular bradycardia Extremities: extremities normal, atraumatic, no cyanosis or edema Neurologic: Grossly normal  Inpatient Medications    Scheduled Meds: . aspirin EC  81 mg Oral Daily  . enoxaparin (LOVENOX) injection  40 mg Subcutaneous Q24H  . sodium chloride flush  3 mL Intravenous Q12H    Continuous Infusions: . sodium chloride      PRN Meds: sodium chloride, acetaminophen, nitroGLYCERIN, ondansetron (ZOFRAN) IV, sodium chloride flush, traZODone   Labs   Results for orders placed or performed during the hospital encounter of 02/10/17 (from the past 48 hour(s))  Basic metabolic panel     Status: Abnormal   Collection Time: 02/10/17  8:23 AM  Result Value Ref Range   Sodium 137 135 - 145 mmol/L   Potassium 4.1 3.5 - 5.1 mmol/L   Chloride 104 101 - 111 mmol/L   CO2 25 22 - 32 mmol/L   Glucose, Bld 103 (H) 65 - 99 mg/dL   BUN 10 6 - 20 mg/dL   Creatinine, Ser 0.80 0.61 - 1.24 mg/dL   Calcium 9.1 8.9 - 10.3 mg/dL   GFR calc non Af Amer >60 >60 mL/min   GFR calc Af Amer >60 >60 mL/min    Comment: (NOTE) The eGFR has been  calculated using the CKD EPI equation. This calculation has not been validated in all clinical situations. eGFR's persistently <60 mL/min signify possible Chronic Kidney Disease.    Anion gap 8 5 - 15  CBC     Status: None   Collection Time: 02/10/17  8:23 AM  Result Value Ref Range   WBC 4.9 4.0 - 10.5 K/uL   RBC 4.66 4.22 - 5.81 MIL/uL   Hemoglobin 13.6 13.0 - 17.0 g/dL   HCT 41.2 39.0 - 52.0 %   MCV 88.4 78.0 - 100.0 fL   MCH 29.2 26.0 - 34.0 pg   MCHC 33.0 30.0 - 36.0 g/dL   RDW 12.5 11.5 - 15.5 %   Platelets 261 150 - 400 K/uL  I-stat troponin, ED     Status: None   Collection Time: 02/10/17  8:31 AM  Result Value Ref Range   Troponin i, poc 0.00 0.00 - 0.08 ng/mL   Comment 3            Comment: Due to the release kinetics of cTnI, a negative result within the first hours of the onset of symptoms does not rule out myocardial infarction with certainty. If  myocardial infarction is still suspected, repeat the test at appropriate intervals.   I-Stat Troponin, ED (not at [facility])     Status: None   Collection Time: 02/10/17 11:26 AM  Result Value Ref Range   Troponin i, poc 0.00 0.00 - 0.08 ng/mL   Comment 3            Comment: Due to the release kinetics of cTnI, a negative result within the first hours of the onset of symptoms does not rule out myocardial infarction with certainty. If myocardial infarction is still suspected, repeat the test at appropriate intervals.   Protime-INR     Status: None   Collection Time: 02/10/17 12:35 PM  Result Value Ref Range   Prothrombin Time 13.9 11.4 - 15.2 seconds   INR 1.07   CBC     Status: None   Collection Time: 02/10/17 12:35 PM  Result Value Ref Range   WBC 6.3 4.0 - 10.5 K/uL   RBC 4.47 4.22 - 5.81 MIL/uL   Hemoglobin 13.0 13.0 - 17.0 g/dL   HCT 39.6 39.0 - 52.0 %   MCV 88.6 78.0 - 100.0 fL   MCH 29.1 26.0 - 34.0 pg   MCHC 32.8 30.0 - 36.0 g/dL   RDW 12.4 11.5 - 15.5 %   Platelets 251 150 - 400 K/uL  Creatinine,  serum     Status: None   Collection Time: 02/10/17 12:35 PM  Result Value Ref Range   Creatinine, Ser 0.74 0.61 - 1.24 mg/dL   GFR calc non Af Amer >60 >60 mL/min   GFR calc Af Amer >60 >60 mL/min    Comment: (NOTE) The eGFR has been calculated using the CKD EPI equation. This calculation has not been validated in all clinical situations. eGFR's persistently <60 mL/min signify possible Chronic Kidney Disease.   Troponin I (q 6hr x 3)     Status: None   Collection Time: 02/10/17  7:56 PM  Result Value Ref Range   Troponin I <0.03 <0.03 ng/mL  CBC     Status: None   Collection Time: 02/11/17 12:11 AM  Result Value Ref Range   WBC 6.6 4.0 - 10.5 K/uL   RBC 4.42 4.22 - 5.81 MIL/uL   Hemoglobin 13.2 13.0 - 17.0 g/dL   HCT 39.5 39.0 - 52.0 %   MCV 89.4 78.0 - 100.0 fL   MCH 29.9 26.0 - 34.0 pg   MCHC 33.4 30.0 - 36.0 g/dL   RDW 12.5 11.5 - 15.5 %   Platelets 245 150 - 400 K/uL  Protime-INR     Status: None   Collection Time: 02/11/17 12:11 AM  Result Value Ref Range   Prothrombin Time 14.3 11.4 - 15.2 seconds   INR 8.31   Basic metabolic panel     Status: None   Collection Time: 02/11/17 12:11 AM  Result Value Ref Range   Sodium 136 135 - 145 mmol/L   Potassium 3.9 3.5 - 5.1 mmol/L   Chloride 106 101 - 111 mmol/L   CO2 24 22 - 32 mmol/L   Glucose, Bld 91 65 - 99 mg/dL   BUN 12 6 - 20 mg/dL   Creatinine, Ser 0.77 0.61 - 1.24 mg/dL   Calcium 9.0 8.9 - 10.3 mg/dL   GFR calc non Af Amer >60 >60 mL/min   GFR calc Af Amer >60 >60 mL/min    Comment: (NOTE) The eGFR has been calculated using the CKD EPI equation. This calculation has not been  validated in all clinical situations. eGFR's persistently <60 mL/min signify possible Chronic Kidney Disease.    Anion gap 6 5 - 15  Troponin I (q 6hr x 3)     Status: None   Collection Time: 02/11/17 12:11 AM  Result Value Ref Range   Troponin I <0.03 <0.03 ng/mL  Troponin I (q 6hr x 3)     Status: None   Collection Time: 02/11/17   7:19 AM  Result Value Ref Range   Troponin I <0.03 <0.03 ng/mL    ECG   Junctional bradycardia at 49 - Personally Reviewed  Telemetry   Junctional bradycardia in the 40's - Personally Reviewed  Radiology    Dg Chest 2 View  Result Date: 02/10/2017 CLINICAL DATA:  Shortness of breath and chest pain EXAM: CHEST  2 VIEW COMPARISON:  February 02, 2017 FINDINGS: Lungs are clear. The heart size and pulmonary vascularity are normal. No adenopathy. No bone lesions. No pneumothorax. IMPRESSION: No edema or consolidation. Electronically Signed   By: Lowella Grip III M.D.   On: 02/10/2017 08:44    Cardiac Studies   N/A  Assessment   1. Active Problems: 2.   Chest pain 3. Junctional bradycardia  Plan   1. New finding of junctional bradycardia this morning. Not on an AVN blocker - ?if this is contributing to his symptoms or a result of ischemia. Will pursue treadmill myoview today (NO LEXISCAN D/T BRADYCARDIA). May need monitor after discharge.  Time Spent Directly with Patient:  I have spent a total of 15 minutes with the patient reviewing hospital notes, telemetry, EKGs, labs and examining the patient as well as establishing an assessment and plan that was discussed personally with the patient. > 50% of time was spent in direct patient care.  Length of Stay:  LOS: 0 days   Pixie Casino, MD, [place]  Attending Cardiologist  Direct Dial: [phone]  Fax: [phone]  Website:  [email or web address]  Nadean Corwin  02/11/2017, 8:12 AM

## 2017-02-11 NOTE — Progress Notes (Signed)
Exercise myoview completed with chest burning last min. Of exercise, did not resolve 6/10 discomfort gave 1 NTG with decrease to 1-2/10.  nuc results to follow, concerning with strong family hx.

## 2017-02-11 NOTE — Discharge Instructions (Signed)
STOP SMOKING!!!!!!  OK to return to work but increase activity gradually.

## 2017-02-20 ENCOUNTER — Encounter: Payer: Self-pay | Admitting: Internal Medicine

## 2017-02-20 ENCOUNTER — Ambulatory Visit (INDEPENDENT_AMBULATORY_CARE_PROVIDER_SITE_OTHER): Payer: 59 | Admitting: Internal Medicine

## 2017-02-20 VITALS — BP 112/80 | HR 65 | Ht 71.0 in | Wt 173.6 lb

## 2017-02-20 DIAGNOSIS — R001 Bradycardia, unspecified: Secondary | ICD-10-CM | POA: Diagnosis not present

## 2017-02-20 DIAGNOSIS — R079 Chest pain, unspecified: Secondary | ICD-10-CM | POA: Diagnosis not present

## 2017-02-20 DIAGNOSIS — R002 Palpitations: Secondary | ICD-10-CM | POA: Diagnosis not present

## 2017-02-20 NOTE — Patient Instructions (Addendum)
Your physician has recommended that you wear an event monitor for 2 weeks - [address] @ [address]. Event monitors are medical devices that record the heart's electrical activity. Doctors most often us these monitors to diagnose arrhythmias. Arrhythmias are problems with the speed or rhythm of the heartbeat. The monitor is a small, portable device. You can wear one while you do your normal daily activities. This is usually used to diagnose what is causing palpitations/syncope (passing out).  Your physician recommends that you schedule a follow-up appointment with Dr. Rennis GoldenHilty after your monitor
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
## 2017-02-23 MED ORDER — ZOLPIDEM TARTRATE 5 MG PO TABS: ORAL_TABLET | ORAL | 0 refills | Status: DC

## 2017-02-23 NOTE — Telephone Encounter (Signed)
Called in medication to the [facility] pharmacy as instructed. 

## 2017-02-23 NOTE — Telephone Encounter (Signed)
Please call in Ambien 5 mg tablets. Take 1 tablet by mouth at bedtime as needed for insomnia. #15, no refills.

## 2017-02-27 NOTE — Telephone Encounter (Signed)
Please get patient in for UDS and controlled substance contract for Ambien Rx. Also needs lab appointment for blood draw.

## 2017-02-27 NOTE — Telephone Encounter (Signed)
Lab appt on 03/04/2017

## 2017-03-02 ENCOUNTER — Other Ambulatory Visit: Payer: Self-pay | Admitting: Primary Care

## 2017-03-02 DIAGNOSIS — G4700 Insomnia, unspecified: Secondary | ICD-10-CM

## 2017-03-03 ENCOUNTER — Ambulatory Visit (INDEPENDENT_AMBULATORY_CARE_PROVIDER_SITE_OTHER): Payer: 59

## 2017-03-03 DIAGNOSIS — R001 Bradycardia, unspecified: Secondary | ICD-10-CM

## 2017-03-03 DIAGNOSIS — R002 Palpitations: Secondary | ICD-10-CM

## 2017-03-03 DIAGNOSIS — R079 Chest pain, unspecified: Secondary | ICD-10-CM

## 2017-03-04 ENCOUNTER — Other Ambulatory Visit (INDEPENDENT_AMBULATORY_CARE_PROVIDER_SITE_OTHER): Payer: 59

## 2017-03-04 ENCOUNTER — Encounter: Payer: Self-pay | Admitting: Primary Care

## 2017-03-04 DIAGNOSIS — R071 Chest pain on breathing: Secondary | ICD-10-CM

## 2017-03-05 ENCOUNTER — Encounter: Payer: Self-pay | Admitting: Primary Care

## 2017-03-05 LAB — D-DIMER, QUANTITATIVE (NOT AT ARMC): D DIMER QUANT: 0.47 ug{FEU}/mL (ref <0.50)

## 2017-03-18 ENCOUNTER — Other Ambulatory Visit: Payer: Self-pay | Admitting: Primary Care

## 2017-03-18 DIAGNOSIS — G4700 Insomnia, unspecified: Secondary | ICD-10-CM

## 2017-03-18 MED ORDER — ZOLPIDEM TARTRATE 5 MG PO TABS: ORAL_TABLET | ORAL | 0 refills | Status: DC

## 2017-03-18 NOTE — Telephone Encounter (Signed)
Okay to refill as prescribed. #90, no refills. Needs repeat UDS before any further refills, due 06/04/17. Please set up.

## 2017-03-18 NOTE — Telephone Encounter (Signed)
Ok to refill? Electronically refill request for zolpidem (AMBIEN) 5 MG tablet #15 with 0 refills  Last prescribed on 02/23/2017. Last seen on 02/02/2017. Patient also came in for UDS.

## 2017-03-18 NOTE — Telephone Encounter (Signed)
Called in medication to the pharmacy as instructed. 

## 2017-03-18 NOTE — Telephone Encounter (Signed)
Message left for patient to return my call.  

## 2017-04-03 ENCOUNTER — Encounter: Payer: Self-pay | Admitting: Internal Medicine

## 2017-04-03 ENCOUNTER — Ambulatory Visit (INDEPENDENT_AMBULATORY_CARE_PROVIDER_SITE_OTHER): Payer: 59 | Admitting: Internal Medicine

## 2017-04-03 VITALS — BP 116/74 | HR 64 | Ht 71.0 in | Wt 176.0 lb

## 2017-04-03 DIAGNOSIS — R079 Chest pain, unspecified: Secondary | ICD-10-CM | POA: Diagnosis not present

## 2017-04-03 DIAGNOSIS — R002 Palpitations: Secondary | ICD-10-CM

## 2017-04-03 DIAGNOSIS — R001 Bradycardia, unspecified: Secondary | ICD-10-CM | POA: Diagnosis not present

## 2017-04-03 NOTE — Patient Instructions (Signed)
Your physician wants you to follow-up in: 6 months with Dr. Hilty. You will receive a reminder letter in the mail two months in advance. If you don't receive a letter, please call our office to schedule the follow-up appointment.    
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
## 2019-04-20 ENCOUNTER — Emergency Department (HOSPITAL_COMMUNITY): Payer: BC Managed Care – PPO

## 2019-04-20 ENCOUNTER — Inpatient Hospital Stay (HOSPITAL_COMMUNITY)
Admission: EM | Admit: 2019-04-20 | Discharge: 2019-06-08 | DRG: 003 | Disposition: A | Discharge status: Rehabilitation Facility | Payer: BC Managed Care – PPO | Attending: General Surgery | Admitting: General Surgery

## 2019-04-20 ENCOUNTER — Encounter (HOSPITAL_COMMUNITY): Admission: EM | Disposition: A | Discharge status: Rehabilitation Facility | Payer: Self-pay | Source: Home / Self Care

## 2019-04-20 ENCOUNTER — Inpatient Hospital Stay (HOSPITAL_COMMUNITY): Payer: BC Managed Care – PPO | Admitting: Anesthesiology

## 2019-04-20 ENCOUNTER — Encounter (HOSPITAL_COMMUNITY): Payer: Self-pay | Admitting: Student

## 2019-04-20 DIAGNOSIS — J156 Pneumonia due to other aerobic Gram-negative bacteria: Secondary | ICD-10-CM | POA: Diagnosis not present

## 2019-04-20 DIAGNOSIS — S8251XD Displaced fracture of medial malleolus of right tibia, subsequent encounter for closed fracture with routine healing: Secondary | ICD-10-CM | POA: Diagnosis not present

## 2019-04-20 DIAGNOSIS — F419 Anxiety disorder, unspecified: Secondary | ICD-10-CM | POA: Diagnosis present

## 2019-04-20 DIAGNOSIS — R1310 Dysphagia, unspecified: Secondary | ICD-10-CM

## 2019-04-20 DIAGNOSIS — R0902 Hypoxemia: Secondary | ICD-10-CM

## 2019-04-20 DIAGNOSIS — R1312 Dysphagia, oropharyngeal phase: Secondary | ICD-10-CM | POA: Diagnosis not present

## 2019-04-20 DIAGNOSIS — J80 Acute respiratory distress syndrome: Secondary | ICD-10-CM | POA: Diagnosis not present

## 2019-04-20 DIAGNOSIS — E870 Hyperosmolality and hypernatremia: Secondary | ICD-10-CM | POA: Diagnosis not present

## 2019-04-20 DIAGNOSIS — S06369D Traumatic hemorrhage of cerebrum, unspecified, with loss of consciousness of unspecified duration, subsequent encounter: Secondary | ICD-10-CM | POA: Diagnosis not present

## 2019-04-20 DIAGNOSIS — Z9689 Presence of other specified functional implants: Secondary | ICD-10-CM

## 2019-04-20 DIAGNOSIS — J9311 Primary spontaneous pneumothorax: Secondary | ICD-10-CM | POA: Diagnosis not present

## 2019-04-20 DIAGNOSIS — S27321A Contusion of lung, unilateral, initial encounter: Secondary | ICD-10-CM

## 2019-04-20 DIAGNOSIS — S81812D Laceration without foreign body, left lower leg, subsequent encounter: Secondary | ICD-10-CM | POA: Diagnosis not present

## 2019-04-20 DIAGNOSIS — S0993XA Unspecified injury of face, initial encounter: Secondary | ICD-10-CM | POA: Diagnosis not present

## 2019-04-20 DIAGNOSIS — S8992XA Unspecified injury of left lower leg, initial encounter: Secondary | ICD-10-CM | POA: Diagnosis not present

## 2019-04-20 DIAGNOSIS — T148XXA Other injury of unspecified body region, initial encounter: Secondary | ICD-10-CM

## 2019-04-20 DIAGNOSIS — S2241XD Multiple fractures of ribs, right side, subsequent encounter for fracture with routine healing: Secondary | ICD-10-CM | POA: Diagnosis not present

## 2019-04-20 DIAGNOSIS — J939 Pneumothorax, unspecified: Secondary | ICD-10-CM

## 2019-04-20 DIAGNOSIS — Z9889 Other specified postprocedural states: Secondary | ICD-10-CM

## 2019-04-20 DIAGNOSIS — S066X0A Traumatic subarachnoid hemorrhage without loss of consciousness, initial encounter: Secondary | ICD-10-CM | POA: Diagnosis not present

## 2019-04-20 DIAGNOSIS — R451 Restlessness and agitation: Secondary | ICD-10-CM | POA: Diagnosis not present

## 2019-04-20 DIAGNOSIS — D72829 Elevated white blood cell count, unspecified: Secondary | ICD-10-CM

## 2019-04-20 DIAGNOSIS — S0101XA Laceration without foreign body of scalp, initial encounter: Secondary | ICD-10-CM | POA: Diagnosis present

## 2019-04-20 DIAGNOSIS — Z419 Encounter for procedure for purposes other than remedying health state, unspecified: Secondary | ICD-10-CM

## 2019-04-20 DIAGNOSIS — J9602 Acute respiratory failure with hypercapnia: Secondary | ICD-10-CM | POA: Diagnosis not present

## 2019-04-20 DIAGNOSIS — J9690 Respiratory failure, unspecified, unspecified whether with hypoxia or hypercapnia: Secondary | ICD-10-CM | POA: Diagnosis not present

## 2019-04-20 DIAGNOSIS — S065X0A Traumatic subdural hemorrhage without loss of consciousness, initial encounter: Secondary | ICD-10-CM | POA: Diagnosis not present

## 2019-04-20 DIAGNOSIS — Z20828 Contact with and (suspected) exposure to other viral communicable diseases: Secondary | ICD-10-CM | POA: Diagnosis not present

## 2019-04-20 DIAGNOSIS — R402312 Coma scale, best motor response, none, at arrival to emergency department: Secondary | ICD-10-CM | POA: Diagnosis present

## 2019-04-20 DIAGNOSIS — G9341 Metabolic encephalopathy: Secondary | ICD-10-CM | POA: Diagnosis not present

## 2019-04-20 DIAGNOSIS — U071 COVID-19: Secondary | ICD-10-CM | POA: Diagnosis not present

## 2019-04-20 DIAGNOSIS — L8990 Pressure ulcer of unspecified site, unspecified stage: Secondary | ICD-10-CM | POA: Insufficient documentation

## 2019-04-20 DIAGNOSIS — A498 Other bacterial infections of unspecified site: Secondary | ICD-10-CM | POA: Diagnosis not present

## 2019-04-20 DIAGNOSIS — I82451 Acute embolism and thrombosis of right peroneal vein: Secondary | ICD-10-CM | POA: Diagnosis not present

## 2019-04-20 DIAGNOSIS — S32491A Other specified fracture of right acetabulum, initial encounter for closed fracture: Secondary | ICD-10-CM | POA: Diagnosis not present

## 2019-04-20 DIAGNOSIS — R000 Tachycardia, unspecified: Secondary | ICD-10-CM | POA: Diagnosis not present

## 2019-04-20 DIAGNOSIS — Z4682 Encounter for fitting and adjustment of non-vascular catheter: Secondary | ICD-10-CM

## 2019-04-20 DIAGNOSIS — Z23 Encounter for immunization: Secondary | ICD-10-CM

## 2019-04-20 DIAGNOSIS — S8251XA Displaced fracture of medial malleolus of right tibia, initial encounter for closed fracture: Secondary | ICD-10-CM | POA: Diagnosis not present

## 2019-04-20 DIAGNOSIS — I82411 Acute embolism and thrombosis of right femoral vein: Secondary | ICD-10-CM | POA: Diagnosis not present

## 2019-04-20 DIAGNOSIS — I10 Essential (primary) hypertension: Secondary | ICD-10-CM | POA: Diagnosis not present

## 2019-04-20 DIAGNOSIS — S299XXA Unspecified injury of thorax, initial encounter: Secondary | ICD-10-CM | POA: Diagnosis not present

## 2019-04-20 DIAGNOSIS — S270XXA Traumatic pneumothorax, initial encounter: Secondary | ICD-10-CM | POA: Diagnosis not present

## 2019-04-20 DIAGNOSIS — S066X9A Traumatic subarachnoid hemorrhage with loss of consciousness of unspecified duration, initial encounter: Secondary | ICD-10-CM | POA: Diagnosis not present

## 2019-04-20 DIAGNOSIS — R58 Hemorrhage, not elsewhere classified: Secondary | ICD-10-CM | POA: Diagnosis not present

## 2019-04-20 DIAGNOSIS — S062X9A Diffuse traumatic brain injury with loss of consciousness of unspecified duration, initial encounter: Secondary | ICD-10-CM | POA: Diagnosis not present

## 2019-04-20 DIAGNOSIS — D696 Thrombocytopenia, unspecified: Secondary | ICD-10-CM | POA: Diagnosis not present

## 2019-04-20 DIAGNOSIS — J40 Bronchitis, not specified as acute or chronic: Secondary | ICD-10-CM | POA: Diagnosis not present

## 2019-04-20 DIAGNOSIS — E874 Mixed disorder of acid-base balance: Secondary | ICD-10-CM | POA: Diagnosis not present

## 2019-04-20 DIAGNOSIS — Z978 Presence of other specified devices: Secondary | ICD-10-CM | POA: Diagnosis not present

## 2019-04-20 DIAGNOSIS — D62 Acute posthemorrhagic anemia: Secondary | ICD-10-CM

## 2019-04-20 DIAGNOSIS — R509 Fever, unspecified: Secondary | ICD-10-CM | POA: Diagnosis not present

## 2019-04-20 DIAGNOSIS — Z452 Encounter for adjustment and management of vascular access device: Secondary | ICD-10-CM | POA: Diagnosis not present

## 2019-04-20 DIAGNOSIS — S199XXA Unspecified injury of neck, initial encounter: Secondary | ICD-10-CM | POA: Diagnosis not present

## 2019-04-20 DIAGNOSIS — S2241XA Multiple fractures of ribs, right side, initial encounter for closed fracture: Secondary | ICD-10-CM | POA: Diagnosis present

## 2019-04-20 DIAGNOSIS — Z930 Tracheostomy status: Secondary | ICD-10-CM | POA: Diagnosis not present

## 2019-04-20 DIAGNOSIS — T1490XA Injury, unspecified, initial encounter: Secondary | ICD-10-CM

## 2019-04-20 DIAGNOSIS — J9383 Other pneumothorax: Secondary | ICD-10-CM | POA: Diagnosis not present

## 2019-04-20 DIAGNOSIS — R402212 Coma scale, best verbal response, none, at arrival to emergency department: Secondary | ICD-10-CM | POA: Diagnosis present

## 2019-04-20 DIAGNOSIS — Z4659 Encounter for fitting and adjustment of other gastrointestinal appliance and device: Secondary | ICD-10-CM

## 2019-04-20 DIAGNOSIS — R918 Other nonspecific abnormal finding of lung field: Secondary | ICD-10-CM | POA: Diagnosis not present

## 2019-04-20 DIAGNOSIS — R609 Edema, unspecified: Secondary | ICD-10-CM | POA: Diagnosis not present

## 2019-04-20 DIAGNOSIS — J90 Pleural effusion, not elsewhere classified: Secondary | ICD-10-CM | POA: Diagnosis present

## 2019-04-20 DIAGNOSIS — S062X9D Diffuse traumatic brain injury with loss of consciousness of unspecified duration, subsequent encounter: Secondary | ICD-10-CM | POA: Diagnosis not present

## 2019-04-20 DIAGNOSIS — E876 Hypokalemia: Secondary | ICD-10-CM | POA: Diagnosis not present

## 2019-04-20 DIAGNOSIS — S32611A Displaced avulsion fracture of right ischium, initial encounter for closed fracture: Secondary | ICD-10-CM | POA: Diagnosis not present

## 2019-04-20 DIAGNOSIS — S8251XS Displaced fracture of medial malleolus of right tibia, sequela: Secondary | ICD-10-CM | POA: Diagnosis not present

## 2019-04-20 DIAGNOSIS — J9601 Acute respiratory failure with hypoxia: Secondary | ICD-10-CM | POA: Diagnosis present

## 2019-04-20 DIAGNOSIS — S7291XD Unspecified fracture of right femur, subsequent encounter for closed fracture with routine healing: Secondary | ICD-10-CM | POA: Diagnosis not present

## 2019-04-20 DIAGNOSIS — R402214 Coma scale, best verbal response, none, 24 hours or more after hospital admission: Secondary | ICD-10-CM | POA: Diagnosis not present

## 2019-04-20 DIAGNOSIS — Z03818 Encounter for observation for suspected exposure to other biological agents ruled out: Secondary | ICD-10-CM | POA: Diagnosis not present

## 2019-04-20 DIAGNOSIS — S0990XA Unspecified injury of head, initial encounter: Secondary | ICD-10-CM | POA: Diagnosis not present

## 2019-04-20 DIAGNOSIS — R4020 Unspecified coma: Secondary | ICD-10-CM | POA: Diagnosis not present

## 2019-04-20 DIAGNOSIS — S72111A Displaced fracture of greater trochanter of right femur, initial encounter for closed fracture: Secondary | ICD-10-CM | POA: Diagnosis not present

## 2019-04-20 DIAGNOSIS — S2231XA Fracture of one rib, right side, initial encounter for closed fracture: Secondary | ICD-10-CM | POA: Diagnosis not present

## 2019-04-20 DIAGNOSIS — S72114A Nondisplaced fracture of greater trochanter of right femur, initial encounter for closed fracture: Secondary | ICD-10-CM | POA: Diagnosis not present

## 2019-04-20 DIAGNOSIS — R069 Unspecified abnormalities of breathing: Secondary | ICD-10-CM

## 2019-04-20 DIAGNOSIS — J151 Pneumonia due to Pseudomonas: Secondary | ICD-10-CM | POA: Diagnosis present

## 2019-04-20 DIAGNOSIS — J9382 Other air leak: Secondary | ICD-10-CM | POA: Diagnosis not present

## 2019-04-20 DIAGNOSIS — R4689 Other symptoms and signs involving appearance and behavior: Secondary | ICD-10-CM | POA: Diagnosis not present

## 2019-04-20 DIAGNOSIS — Y905 Blood alcohol level of 100-119 mg/100 ml: Secondary | ICD-10-CM | POA: Diagnosis present

## 2019-04-20 DIAGNOSIS — R402314 Coma scale, best motor response, none, 24 hours or more after hospital admission: Secondary | ICD-10-CM | POA: Diagnosis present

## 2019-04-20 DIAGNOSIS — Y95 Nosocomial condition: Secondary | ICD-10-CM | POA: Diagnosis not present

## 2019-04-20 DIAGNOSIS — S81812A Laceration without foreign body, left lower leg, initial encounter: Secondary | ICD-10-CM

## 2019-04-20 DIAGNOSIS — Z430 Encounter for attention to tracheostomy: Secondary | ICD-10-CM

## 2019-04-20 DIAGNOSIS — S42111A Displaced fracture of body of scapula, right shoulder, initial encounter for closed fracture: Secondary | ICD-10-CM | POA: Diagnosis present

## 2019-04-20 DIAGNOSIS — S42101D Fracture of unspecified part of scapula, right shoulder, subsequent encounter for fracture with routine healing: Secondary | ICD-10-CM | POA: Diagnosis not present

## 2019-04-20 DIAGNOSIS — Z9289 Personal history of other medical treatment: Secondary | ICD-10-CM

## 2019-04-20 DIAGNOSIS — T07XXXA Unspecified multiple injuries, initial encounter: Secondary | ICD-10-CM | POA: Diagnosis not present

## 2019-04-20 DIAGNOSIS — J9600 Acute respiratory failure, unspecified whether with hypoxia or hypercapnia: Secondary | ICD-10-CM | POA: Diagnosis not present

## 2019-04-20 DIAGNOSIS — I82441 Acute embolism and thrombosis of right tibial vein: Secondary | ICD-10-CM | POA: Diagnosis not present

## 2019-04-20 DIAGNOSIS — Z91030 Bee allergy status: Secondary | ICD-10-CM

## 2019-04-20 DIAGNOSIS — I119 Hypertensive heart disease without heart failure: Secondary | ICD-10-CM | POA: Diagnosis not present

## 2019-04-20 DIAGNOSIS — J189 Pneumonia, unspecified organism: Secondary | ICD-10-CM | POA: Diagnosis not present

## 2019-04-20 DIAGNOSIS — F1290 Cannabis use, unspecified, uncomplicated: Secondary | ICD-10-CM | POA: Diagnosis present

## 2019-04-20 DIAGNOSIS — R0689 Other abnormalities of breathing: Secondary | ICD-10-CM | POA: Diagnosis not present

## 2019-04-20 DIAGNOSIS — S81802A Unspecified open wound, left lower leg, initial encounter: Secondary | ICD-10-CM | POA: Diagnosis not present

## 2019-04-20 DIAGNOSIS — S069X0S Unspecified intracranial injury without loss of consciousness, sequela: Secondary | ICD-10-CM | POA: Diagnosis not present

## 2019-04-20 DIAGNOSIS — K117 Disturbances of salivary secretion: Secondary | ICD-10-CM

## 2019-04-20 DIAGNOSIS — R402114 Coma scale, eyes open, never, 24 hours or more after hospital admission: Secondary | ICD-10-CM | POA: Diagnosis not present

## 2019-04-20 DIAGNOSIS — R52 Pain, unspecified: Secondary | ICD-10-CM

## 2019-04-20 DIAGNOSIS — I82431 Acute embolism and thrombosis of right popliteal vein: Secondary | ICD-10-CM | POA: Diagnosis not present

## 2019-04-20 DIAGNOSIS — Z9911 Dependence on respirator [ventilator] status: Secondary | ICD-10-CM

## 2019-04-20 DIAGNOSIS — S72114S Nondisplaced fracture of greater trochanter of right femur, sequela: Secondary | ICD-10-CM | POA: Diagnosis not present

## 2019-04-20 DIAGNOSIS — S069X3S Unspecified intracranial injury with loss of consciousness of 1 hour to 5 hours 59 minutes, sequela: Secondary | ICD-10-CM | POA: Diagnosis not present

## 2019-04-20 DIAGNOSIS — J984 Other disorders of lung: Secondary | ICD-10-CM | POA: Diagnosis not present

## 2019-04-20 DIAGNOSIS — R739 Hyperglycemia, unspecified: Secondary | ICD-10-CM | POA: Diagnosis not present

## 2019-04-20 DIAGNOSIS — D6489 Other specified anemias: Secondary | ICD-10-CM | POA: Diagnosis not present

## 2019-04-20 DIAGNOSIS — S72111D Displaced fracture of greater trochanter of right femur, subsequent encounter for closed fracture with routine healing: Secondary | ICD-10-CM | POA: Diagnosis not present

## 2019-04-20 DIAGNOSIS — G9340 Encephalopathy, unspecified: Secondary | ICD-10-CM | POA: Diagnosis not present

## 2019-04-20 DIAGNOSIS — R404 Transient alteration of awareness: Secondary | ICD-10-CM | POA: Diagnosis not present

## 2019-04-20 DIAGNOSIS — R402142 Coma scale, eyes open, spontaneous, at arrival to emergency department: Secondary | ICD-10-CM | POA: Diagnosis present

## 2019-04-20 HISTORY — PX: LACERATION REPAIR: SHX5284

## 2019-04-20 HISTORY — PX: I & D EXTREMITY: SHX5045

## 2019-04-20 LAB — ETHANOL: Alcohol, Ethyl (B): 103 mg/dL — ABNORMAL HIGH (ref <10)

## 2019-04-20 LAB — URINALYSIS, ROUTINE W REFLEX MICROSCOPIC
Bilirubin Urine: NEGATIVE
Glucose, UA: NEGATIVE mg/dL
Ketones, ur: NEGATIVE mg/dL
Leukocytes,Ua: NEGATIVE
Nitrite: NEGATIVE
Protein, ur: NEGATIVE mg/dL
Specific Gravity, Urine: >1.046 — ABNORMAL HIGH (ref 1.005–1.030)
pH: 5 (ref 5.0–8.0)

## 2019-04-20 LAB — COMPREHENSIVE METABOLIC PANEL
ALT: 63 U/L — ABNORMAL HIGH (ref 0–44)
AST: 78 U/L — ABNORMAL HIGH (ref 15–41)
Albumin: 4.2 g/dL (ref 3.5–5.0)
Alkaline Phosphatase: 55 U/L (ref 38–126)
Anion gap: 15 (ref 5–15)
BUN: 11 mg/dL (ref 6–20)
CO2: 19 mmol/L — ABNORMAL LOW (ref 22–32)
Calcium: 8.7 mg/dL — ABNORMAL LOW (ref 8.9–10.3)
Chloride: 105 mmol/L (ref 98–111)
Creatinine, Ser: 0.83 mg/dL (ref 0.61–1.24)
GFR calc Af Amer: >60 mL/min (ref >60)
GFR calc non Af Amer: >60 mL/min (ref >60)
Glucose, Bld: 135 mg/dL — ABNORMAL HIGH (ref 70–99)
Potassium: 3.1 mmol/L — ABNORMAL LOW (ref 3.5–5.1)
Sodium: 139 mmol/L (ref 135–145)
Total Bilirubin: 0.5 mg/dL (ref 0.3–1.2)
Total Protein: 7.1 g/dL (ref 6.5–8.1)

## 2019-04-20 LAB — POCT I-STAT 7, (LYTES, BLD GAS, ICA,H+H)
Acid-base deficit: 6 mmol/L — ABNORMAL HIGH (ref 0.0–2.0)
Bicarbonate: 20.4 mmol/L (ref 20.0–28.0)
Calcium, Ion: 1.16 mmol/L (ref 1.15–1.40)
HCT: 39 % (ref 39.0–52.0)
Hemoglobin: 13.3 g/dL (ref 13.0–17.0)
O2 Saturation: 100 %
Patient temperature: 97.5
Potassium: 2.8 mmol/L — ABNORMAL LOW (ref 3.5–5.1)
Sodium: 139 mmol/L (ref 135–145)
TCO2: 22 mmol/L (ref 22–32)
pCO2 arterial: 42.7 mmHg (ref 32.0–48.0)
pH, Arterial: 7.284 — ABNORMAL LOW (ref 7.350–7.450)
pO2, Arterial: 462 mmHg — ABNORMAL HIGH (ref 83.0–108.0)

## 2019-04-20 LAB — CBC
HCT: 42.3 % (ref 39.0–52.0)
Hemoglobin: 14.1 g/dL (ref 13.0–17.0)
MCH: 30.8 pg (ref 26.0–34.0)
MCHC: 33.3 g/dL (ref 30.0–36.0)
MCV: 92.4 fL (ref 80.0–100.0)
Platelets: 293 10*3/uL (ref 150–400)
RBC: 4.58 MIL/uL (ref 4.22–5.81)
RDW: 12.4 % (ref 11.5–15.5)
WBC: 17.1 10*3/uL — ABNORMAL HIGH (ref 4.0–10.5)
nRBC: 0.1 % (ref 0.0–0.2)

## 2019-04-20 LAB — I-STAT CHEM 8, ED
BUN: 13 mg/dL (ref 6–20)
Calcium, Ion: 1.07 mmol/L — ABNORMAL LOW (ref 1.15–1.40)
Chloride: 104 mmol/L (ref 98–111)
Creatinine, Ser: 0.9 mg/dL (ref 0.61–1.24)
Glucose, Bld: 132 mg/dL — ABNORMAL HIGH (ref 70–99)
HCT: 46 % (ref 39.0–52.0)
Hemoglobin: 15.6 g/dL (ref 13.0–17.0)
Potassium: 3 mmol/L — ABNORMAL LOW (ref 3.5–5.1)
Sodium: 139 mmol/L (ref 135–145)
TCO2: 23 mmol/L (ref 22–32)

## 2019-04-20 LAB — PROTIME-INR
INR: 1.1 (ref 0.8–1.2)
Prothrombin Time: 14.1 seconds (ref 11.4–15.2)

## 2019-04-20 LAB — SARS CORONAVIRUS 2 BY RT PCR (HOSPITAL ORDER, PERFORMED IN ~~LOC~~ HOSPITAL LAB): SARS Coronavirus 2: NEGATIVE

## 2019-04-20 LAB — LACTIC ACID, PLASMA
Lactic Acid, Venous: 3.3 mmol/L (ref 0.5–1.9)
Lactic Acid, Venous: 5.1 mmol/L (ref 0.5–1.9)

## 2019-04-20 LAB — TRIGLYCERIDES: Triglycerides: 88 mg/dL

## 2019-04-20 LAB — CDS SEROLOGY

## 2019-04-20 SURGERY — IRRIGATION AND DEBRIDEMENT EXTREMITY
Anesthesia: General | Site: Leg Lower | Laterality: Left

## 2019-04-20 MED ORDER — ONDANSETRON HCL 4 MG/2ML IJ SOLN
INTRAMUSCULAR | Status: DC | PRN
  Administered 2019-04-20: 4 mg via INTRAVENOUS

## 2019-04-20 MED ORDER — ONDANSETRON HCL 4 MG/2ML IJ SOLN
INTRAMUSCULAR | Status: AC
  Filled 2019-04-20: qty 4

## 2019-04-20 MED ORDER — PROPOFOL 10 MG/ML IV BOLUS
INTRAVENOUS | Status: AC
  Filled 2019-04-20: qty 20

## 2019-04-20 MED ORDER — PROPOFOL 500 MG/50ML IV EMUL
INTRAVENOUS | Status: DC | PRN
  Administered 2019-04-20: 50 ug/kg/min via INTRAVENOUS

## 2019-04-20 MED ORDER — PHENYLEPHRINE 40 MCG/ML (10ML) SYRINGE FOR IV PUSH (FOR BLOOD PRESSURE SUPPORT)
PREFILLED_SYRINGE | INTRAVENOUS | Status: AC
  Filled 2019-04-20: qty 10

## 2019-04-20 MED ORDER — LACTATED RINGERS IV BOLUS: 1000.0000 mL | Freq: Once | INTRAVENOUS | Status: DC

## 2019-04-20 MED ORDER — FOLIC ACID 1 MG PO TABS: 1.0000 mg | ORAL_TABLET | Freq: Every day | ORAL | Status: DC

## 2019-04-20 MED ORDER — PROPOFOL 1000 MG/100ML IV EMUL
0.0000 ug/kg/min | INTRAVENOUS | Status: DC
  Administered 2019-04-20: 20 ug/kg/min via INTRAVENOUS
  Filled 2019-04-20: qty 100

## 2019-04-20 MED ORDER — THIAMINE HCL 100 MG/ML IJ SOLN: 100.0000 mg | Freq: Every day | INTRAMUSCULAR | Status: DC

## 2019-04-20 MED ORDER — FENTANYL CITRATE (PF) 100 MCG/2ML IJ SOLN
100.0000 ug | INTRAMUSCULAR | Status: AC | PRN
  Administered 2019-04-20 – 2019-04-24 (×3): 100 ug via INTRAVENOUS
  Filled 2019-04-20 (×2): qty 2

## 2019-04-20 MED ORDER — PROPOFOL 1000 MG/100ML IV EMUL
0.0000 ug/kg/min | INTRAVENOUS | Status: DC
  Administered 2019-04-20 – 2019-04-21 (×3): 50 ug/kg/min via INTRAVENOUS
  Administered 2019-04-23 – 2019-04-24 (×4): 5 ug/kg/min via INTRAVENOUS
  Administered 2019-04-25 (×2): 15 ug/kg/min via INTRAVENOUS
  Administered 2019-04-26 (×3): 20 ug/kg/min via INTRAVENOUS
  Administered 2019-04-27: 30 ug/kg/min via INTRAVENOUS
  Filled 2019-04-20 (×13): qty 100

## 2019-04-20 MED ORDER — SODIUM CHLORIDE 0.9 % IV BOLUS
1000.0000 mL | Freq: Once | INTRAVENOUS | Status: AC
  Administered 2019-04-20: 1000 mL via INTRAVENOUS

## 2019-04-20 MED ORDER — FENTANYL CITRATE (PF) 250 MCG/5ML IJ SOLN
INTRAMUSCULAR | Status: AC
  Filled 2019-04-20: qty 5

## 2019-04-20 MED ORDER — CHLORHEXIDINE GLUCONATE CLOTH 2 % EX PADS
6.0000 | MEDICATED_PAD | Freq: Every day | CUTANEOUS | Status: DC
  Administered 2019-04-21 – 2019-05-02 (×10): 6 via TOPICAL

## 2019-04-20 MED ORDER — FENTANYL CITRATE (PF) 100 MCG/2ML IJ SOLN
INTRAMUSCULAR | Status: DC | PRN
  Administered 2019-04-20 (×5): 50 ug via INTRAVENOUS

## 2019-04-20 MED ORDER — ONDANSETRON HCL 4 MG/2ML IJ SOLN
4.0000 mg | Freq: Four times a day (QID) | INTRAMUSCULAR | Status: DC | PRN
  Administered 2019-04-22: 4 mg via INTRAVENOUS

## 2019-04-20 MED ORDER — LACTATED RINGERS IV SOLN
INTRAVENOUS | Status: DC | PRN
  Administered 2019-04-20 (×2): via INTRAVENOUS

## 2019-04-20 MED ORDER — HYDRALAZINE HCL 20 MG/ML IJ SOLN: 10.0000 mg | INTRAMUSCULAR | Status: DC | PRN

## 2019-04-20 MED ORDER — ARTIFICIAL TEARS OPHTHALMIC OINT
TOPICAL_OINTMENT | OPHTHALMIC | Status: DC | PRN
  Administered 2019-04-20: 1 via OPHTHALMIC

## 2019-04-20 MED ORDER — ROCURONIUM BROMIDE 50 MG/5ML IV SOLN
INTRAVENOUS | Status: AC | PRN
  Administered 2019-04-20: 100 mg via INTRAVENOUS

## 2019-04-20 MED ORDER — ROCURONIUM BROMIDE 50 MG/5ML IV SOSY
PREFILLED_SYRINGE | INTRAVENOUS | Status: DC | PRN
  Administered 2019-04-20: 10 mg via INTRAVENOUS
  Administered 2019-04-20: 50 mg via INTRAVENOUS
  Administered 2019-04-20: 10 mg via INTRAVENOUS

## 2019-04-20 MED ORDER — FENTANYL CITRATE (PF) 100 MCG/2ML IJ SOLN
100.0000 ug | INTRAMUSCULAR | Status: DC | PRN
  Administered 2019-04-24 – 2019-04-25 (×3): 100 ug via INTRAVENOUS

## 2019-04-20 MED ORDER — ONDANSETRON 4 MG PO TBDP: 4.0000 mg | ORAL_TABLET | Freq: Four times a day (QID) | ORAL | Status: DC | PRN

## 2019-04-20 MED ORDER — ARTIFICIAL TEARS OPHTHALMIC OINT
TOPICAL_OINTMENT | OPHTHALMIC | Status: AC
  Filled 2019-04-20: qty 3.5

## 2019-04-20 MED ORDER — LIDOCAINE 2% (20 MG/ML) 5 ML SYRINGE
INTRAMUSCULAR | Status: AC
  Filled 2019-04-20: qty 5

## 2019-04-20 MED ORDER — VITAMIN B-1 100 MG PO TABS: 100.0000 mg | ORAL_TABLET | Freq: Every day | ORAL | Status: DC

## 2019-04-20 MED ORDER — CEFAZOLIN SODIUM-DEXTROSE 2-4 GM/100ML-% IV SOLN
2.0000 g | Freq: Once | INTRAVENOUS | Status: AC
  Administered 2019-04-20: 2 g via INTRAVENOUS
  Filled 2019-04-20: qty 100

## 2019-04-20 MED ORDER — ETOMIDATE 2 MG/ML IV SOLN
INTRAVENOUS | Status: AC | PRN
  Administered 2019-04-20: 20 mg via INTRAVENOUS

## 2019-04-20 MED ORDER — BISACODYL 10 MG RE SUPP
10.0000 mg | Freq: Every day | RECTAL | Status: DC | PRN
  Administered 2019-05-03: 10 mg via RECTAL
  Filled 2019-04-20: qty 1

## 2019-04-20 MED ORDER — IOHEXOL 300 MG/ML  SOLN
100.0000 mL | Freq: Once | INTRAMUSCULAR | Status: AC | PRN
  Administered 2019-04-20: 100 mL via INTRAVENOUS

## 2019-04-20 MED ORDER — ADULT MULTIVITAMIN W/MINERALS CH: 1.0000 | ORAL_TABLET | Freq: Every day | ORAL | Status: DC

## 2019-04-20 MED ORDER — SODIUM CHLORIDE 0.9 % IR SOLN
Status: DC | PRN
  Administered 2019-04-20: 10000 mL

## 2019-04-20 MED ORDER — CEFAZOLIN SODIUM-DEXTROSE 1-4 GM/50ML-% IV SOLN
INTRAVENOUS | Status: DC | PRN
  Administered 2019-04-20: 1 g via INTRAVENOUS

## 2019-04-20 MED ORDER — TETANUS-DIPHTH-ACELL PERTUSSIS 5-2.5-18.5 LF-MCG/0.5 IM SUSP
0.5000 mL | Freq: Once | INTRAMUSCULAR | Status: AC
  Administered 2019-04-20: 0.5 mL via INTRAMUSCULAR
  Filled 2019-04-20: qty 0.5

## 2019-04-20 MED ORDER — PENICILLIN G POTASSIUM 5000000 UNITS IJ SOLR
2.0000 10*6.[IU] | INTRAVENOUS | Status: AC
  Administered 2019-04-20: 2 10*6.[IU] via INTRAVENOUS
  Filled 2019-04-20: qty 2

## 2019-04-20 MED ORDER — FENTANYL CITRATE (PF) 100 MCG/2ML IJ SOLN: 50.0000 ug | Freq: Once | INTRAMUSCULAR | Status: DC

## 2019-04-20 MED ORDER — EPHEDRINE 5 MG/ML INJ
INTRAVENOUS | Status: AC
  Filled 2019-04-20: qty 10

## 2019-04-20 MED ORDER — ROCURONIUM BROMIDE 10 MG/ML (PF) SYRINGE
PREFILLED_SYRINGE | INTRAVENOUS | Status: AC
  Filled 2019-04-20: qty 10

## 2019-04-20 MED ORDER — CEFAZOLIN SODIUM 1 G IJ SOLR
INTRAMUSCULAR | Status: AC
  Filled 2019-04-20: qty 10

## 2019-04-20 MED ORDER — LACTATED RINGERS IV SOLN
INTRAVENOUS | Status: DC
  Administered 2019-04-20 – 2019-04-21 (×2): via INTRAVENOUS

## 2019-04-20 MED ORDER — LORAZEPAM 2 MG/ML IJ SOLN: 1.0000 mg | INTRAMUSCULAR | Status: AC | PRN

## 2019-04-20 MED ORDER — GENTAMICIN SULFATE 40 MG/ML IJ SOLN
5.0000 mg/kg | INTRAVENOUS | Status: AC
  Administered 2019-04-20: 400 mg via INTRAVENOUS
  Filled 2019-04-20: qty 10

## 2019-04-20 MED ORDER — FENTANYL 2500MCG IN NS 250ML (10MCG/ML) PREMIX INFUSION
50.0000 ug/h | INTRAVENOUS | Status: DC
  Administered 2019-04-20 – 2019-04-21 (×2): 200 ug/h via INTRAVENOUS
  Administered 2019-04-22: 175 ug/h via INTRAVENOUS
  Administered 2019-04-22: 125 ug/h via INTRAVENOUS
  Administered 2019-04-23 – 2019-04-24 (×3): 200 ug/h via INTRAVENOUS
  Administered 2019-04-24: 150 ug/h via INTRAVENOUS
  Administered 2019-04-25: 200 ug/h via INTRAVENOUS
  Administered 2019-04-26: 45 ug/h via INTRAVENOUS
  Administered 2019-04-29: 125 ug/h via INTRAVENOUS
  Administered 2019-04-29 – 2019-05-05 (×10): 200 ug/h via INTRAVENOUS
  Filled 2019-04-20 (×26): qty 250

## 2019-04-20 MED ORDER — DEXAMETHASONE SODIUM PHOSPHATE 10 MG/ML IJ SOLN
INTRAMUSCULAR | Status: AC
  Filled 2019-04-20: qty 3

## 2019-04-20 MED ORDER — FENTANYL CITRATE (PF) 100 MCG/2ML IJ SOLN
INTRAMUSCULAR | Status: AC | PRN
  Administered 2019-04-20: 100 ug via INTRAVENOUS

## 2019-04-20 MED ORDER — FENTANYL CITRATE (PF) 100 MCG/2ML IJ SOLN
INTRAMUSCULAR | Status: AC
  Administered 2019-04-20: 100 ug via INTRAVENOUS
  Filled 2019-04-20: qty 2

## 2019-04-20 MED ORDER — FENTANYL BOLUS VIA INFUSION
50.0000 ug | INTRAVENOUS | Status: DC | PRN
  Administered 2019-04-22 – 2019-04-27 (×6): 50 ug via INTRAVENOUS
  Filled 2019-04-20: qty 50

## 2019-04-20 MED ORDER — DOCUSATE SODIUM 50 MG/5ML PO LIQD
100.0000 mg | Freq: Two times a day (BID) | ORAL | Status: DC | PRN
  Administered 2019-04-23 – 2019-05-08 (×4): 100 mg
  Filled 2019-04-20 (×4): qty 10

## 2019-04-20 MED ORDER — LORAZEPAM 1 MG PO TABS: 1.0000 mg | ORAL_TABLET | ORAL | Status: AC | PRN

## 2019-04-20 SURGICAL SUPPLY — 51 items
BNDG COHESIVE 6X5 TAN STRL LF (GAUZE/BANDAGES/DRESSINGS) ×3 IMPLANT
BNDG ELASTIC 6X15 VLCR STRL LF (GAUZE/BANDAGES/DRESSINGS) ×3 IMPLANT
CANISTER WOUNDNEG PRESSURE 500 (CANNISTER) ×3 IMPLANT
COVER SURGICAL LIGHT HANDLE (MISCELLANEOUS) ×3 IMPLANT
COVER WAND RF STERILE (DRAPES) ×3 IMPLANT
CUFF TOURN SGL QUICK 34 (TOURNIQUET CUFF)
CUFF TOURN SGL QUICK 42 (TOURNIQUET CUFF) ×3 IMPLANT
CUFF TRNQT CYL 34X4.125X (TOURNIQUET CUFF) IMPLANT
DRAPE IMP U-DRAPE 54X76 (DRAPES) ×3 IMPLANT
DRAPE ORTHO SPLIT 77X108 STRL (DRAPES) ×2
DRAPE SURG ORHT 6 SPLT 77X108 (DRAPES) ×1 IMPLANT
DRSG ADAPTIC 3X8 NADH LF (GAUZE/BANDAGES/DRESSINGS) ×3 IMPLANT
DRSG MEPITEL 4X7.2 (GAUZE/BANDAGES/DRESSINGS) ×3 IMPLANT
DRSG VAC ATS LRG SENSATRAC (GAUZE/BANDAGES/DRESSINGS) ×3 IMPLANT
ELECT CAUTERY BLADE 6.4 (BLADE) ×6 IMPLANT
ELECT REM PT RETURN 9FT ADLT (ELECTROSURGICAL) ×3
ELECTRODE REM PT RTRN 9FT ADLT (ELECTROSURGICAL) ×1 IMPLANT
FACESHIELD WRAPAROUND (MASK) ×3 IMPLANT
GLOVE BIOGEL PI IND STRL 7.0 (GLOVE) ×1 IMPLANT
GLOVE BIOGEL PI IND STRL 7.5 (GLOVE) ×2 IMPLANT
GLOVE BIOGEL PI INDICATOR 7.0 (GLOVE) ×2
GLOVE BIOGEL PI INDICATOR 7.5 (GLOVE) ×4
GLOVE ECLIPSE 7.0 STRL STRAW (GLOVE) ×9 IMPLANT
GLOVE ECLIPSE 7.5 STRL STRAW (GLOVE) ×3 IMPLANT
GLOVE SKINSENSE NS SZ7.5 (GLOVE) ×8
GLOVE SKINSENSE STRL SZ7.5 (GLOVE) ×4 IMPLANT
GOWN STRL REIN XL XLG (GOWN DISPOSABLE) ×6 IMPLANT
HANDPIECE INTERPULSE COAX TIP (DISPOSABLE) ×2
IV NS IRRIG 3000ML ARTHROMATIC (IV SOLUTION) ×9 IMPLANT
KIT BASIN OR (CUSTOM PROCEDURE TRAY) ×3 IMPLANT
KIT TURNOVER KIT B (KITS) ×3 IMPLANT
MANIFOLD NEPTUNE II (INSTRUMENTS) ×3 IMPLANT
NS IRRIG 1000ML POUR BTL (IV SOLUTION) ×3 IMPLANT
PACK ORTHO EXTREMITY (CUSTOM PROCEDURE TRAY) ×3 IMPLANT
PACK UNIVERSAL I (CUSTOM PROCEDURE TRAY) ×3 IMPLANT
PAD ARMBOARD 7.5X6 YLW CONV (MISCELLANEOUS) ×6 IMPLANT
SET CYSTO W/LG BORE CLAMP LF (SET/KITS/TRAYS/PACK) ×3 IMPLANT
SET HNDPC FAN SPRY TIP SCT (DISPOSABLE) ×1 IMPLANT
SPONGE LAP 18X18 RF (DISPOSABLE) ×6 IMPLANT
STOCKINETTE IMPERVIOUS 9X36 MD (GAUZE/BANDAGES/DRESSINGS) ×3 IMPLANT
SUT ETHILON 2 0 FS 18 (SUTURE) ×6 IMPLANT
SUT ETHILON 2 0 PSLX (SUTURE) ×3 IMPLANT
SUT MON AB 2-0 CT1 36 (SUTURE) ×9 IMPLANT
SWAB CULTURE ESWAB REG 1ML (MISCELLANEOUS) ×3 IMPLANT
TOWEL GREEN STERILE (TOWEL DISPOSABLE) ×3 IMPLANT
TOWEL GREEN STERILE FF (TOWEL DISPOSABLE) ×3 IMPLANT
TUBE CONNECTING 12'X1/4 (SUCTIONS) ×1
TUBE CONNECTING 12X1/4 (SUCTIONS) ×2 IMPLANT
UNDERPAD 30X30 (UNDERPADS AND DIAPERS) ×3 IMPLANT
WATER STERILE IRR 1000ML POUR (IV SOLUTION) ×3 IMPLANT
YANKAUER SUCT BULB TIP NO VENT (SUCTIONS) ×3 IMPLANT

## 2019-04-20 NOTE — Anesthesia Preprocedure Evaluation (Signed)
Anesthesia Evaluation  Patient identified by MRN, date of birth, ID band Patient awake    Reviewed: Patient's Chart, lab work & pertinent test resultsPreop documentation limited or incomplete due to emergent nature of procedure.  Airway Mallampati: Intubated       Dental   Pulmonary    breath sounds clear to auscultation       Cardiovascular  Rhythm:Regular Rate:Normal     Neuro/Psych    GI/Hepatic   Endo/Other    Renal/GU      Musculoskeletal   Abdominal   Peds  Hematology   Anesthesia Other Findings   Reproductive/Obstetrics                             Anesthesia Physical Anesthesia Plan  ASA: IV  Anesthesia Plan: General   Post-op Pain Management:    Induction: Intravenous  PONV Risk Score and Plan: 2 and Dexamethasone and Ondansetron  Airway Management Planned: Oral ETT  Additional Equipment:   Intra-op Plan:   Post-operative Plan: Post-operative intubation/ventilation  Informed Consent: I have reviewed the patients History and Physical, chart, labs and discussed the procedure including the risks, benefits and alternatives for the proposed anesthesia with the patient or authorized representative who has indicated his/her understanding and acceptance.     Dental advisory given  Plan Discussed with: CRNA  Anesthesia Plan Comments:         Anesthesia Quick Evaluation

## 2019-04-20 NOTE — Progress Notes (Signed)
Orthopedic Tech Progress Note Patient Details:  Luke Choi 1971-05-23 [id number] LEVEL 1 TRAUMA Patient ID: [name], male   DOB: [birth year], 48 y.o.   MRN: [id number]   [name] 04/20/2019, 7:10 PM

## 2019-04-20 NOTE — Consult Note (Addendum)
ORTHOPAEDIC CONSULTATION  REQUESTING PHYSICIAN: Dr. Marin Olphristopher White  Chief Complaint: Left lower leg traumatic laceration, right greater trochanter fracture, right scapula fracture  HPI: Luke BustleJason Choi is a 48 y.o. male who presents with large traumatic laceration of left lower leg after rolling over his side by side earlier today.  Was not wearing helmet.  GCS of 6 upon arrival and intubated subsequently.  He reports drinking 6 beers a day.  Uses marijuana daily.  Patient was intubated by the time I saw the patient in the trauma bay.  History reviewed. No pertinent past medical history. History reviewed. No pertinent surgical history. Social History   Socioeconomic History   Marital status: Divorced    Spouse name: Not on file   Number of children: Not on file   Years of education: Not on file   Highest education level: Not on file  Occupational History   Not on file  Social Needs   Financial resource strain: Not on file   Food insecurity    Worry: Not on file    Inability: Not on file   Transportation needs    Medical: Not on file    Non-medical: Not on file  Tobacco Use   Smoking status: Not on file  Substance and Sexual Activity   Alcohol use: Not on file   Drug use: Not on file   Sexual activity: Not on file  Lifestyle   Physical activity    Days per week: Not on file    Minutes per session: Not on file   Stress: Not on file  Relationships   Social connections    Talks on phone: Not on file    Gets together: Not on file    Attends religious service: Not on file    Active member of club or organization: Not on file    Attends meetings of clubs or organizations: Not on file    Relationship status: Not on file  Other Topics Concern   Not on file  Social History Narrative   Not on file   History reviewed. No pertinent family history. - negative except otherwise stated in the family history section Allergies  Allergen Reactions   Bee Venom  Anaphylaxis   Prior to Admission medications   Medication Sig Start Date End Date Taking? Authorizing Provider  Melatonin 10 MG TABS Take 20 mg by mouth at bedtime.   Yes [provider]  Naproxen Sod-diphenhydrAMINE (ALEVE PM PO) Take 1 tablet by mouth at bedtime.   Yes [provider]   Dg Tibia/fibula Left  Result Date: 04/20/2019 CLINICAL DATA:  Trauma EXAM: LEFT TIBIA AND FIBULA - 2 VIEW COMPARISON:  None. FINDINGS: Screws partially imaged in the distal femur. No acute fracture, subluxation or dislocation. No radiopaque foreign body. IMPRESSION: No acute bony abnormality. Electronically Signed   By: Charlett NoseKevin  Dover M.D.   On: 04/20/2019 19:21   Ct Head Wo Contrast  Result Date: 04/20/2019 CLINICAL DATA:  Level 1 trauma. EXAM: CT HEAD WITHOUT CONTRAST CT MAXILLOFACIAL WITHOUT CONTRAST CT CERVICAL SPINE WITHOUT CONTRAST TECHNIQUE: Multidetector CT imaging of the head, cervical spine, and maxillofacial structures were performed using the standard protocol without intravenous contrast. Multiplanar CT image reconstructions of the cervical spine and maxillofacial structures were also generated. COMPARISON:  None. FINDINGS: CT HEAD FINDINGS Brain: There is a small amount of blood along the mid and posterior falx. This is likely subarachnoid. Small amount of subarachnoid blood in the left parietal region. Blood noted along the  surface of the right frontal lobe just anterior to the severe fissure. It is difficult to determine this is parenchymal or sulcal/subarachnoid. Small parenchymal hemorrhage in the posterior right parietal lobe. Small amount of layering blood dependently in the posterior horn of the right lateral ventricle. No mass effect or midline shift. No hydrocephalus. Vascular: No hyperdense vessel or unexpected calcification. Skull: No acute calvarial abnormality. Other: Soft tissue laceration in the posterior right parietal scalp. CT MAXILLOFACIAL FINDINGS Osseous: No fracture or  mandibular dislocation. No destructive process. Orbits: Negative. No traumatic or inflammatory finding. Sinuses: Air-fluid level layering in the sphenoid sinus. This could be related to sinusitis or intubation. Endotracheal tube and OG tube seen in place. Soft tissues: Negative CT CERVICAL SPINE FINDINGS Alignment: Normal Skull base and vertebrae: No acute fracture. No primary bone lesion or focal pathologic process. Soft tissues and spinal canal: No prevertebral fluid or swelling. No visible canal hematoma. Disc levels: Diffuse degenerative disc disease with disc space narrowing and spurring. Upper chest: No acute findings Other: None IMPRESSION: Multiple small areas of subarachnoid hemorrhage noted along the falx, in the left parietal region, and possibly lateral right frontal region. This frontal area could also be small parenchymal contusion. There is a parenchymal contusion within the posterior left parietal lobe. Small amount of layering intraventricular blood. No hydrocephalus, mass effect or midline shift. No acute bony abnormality in the face or cervical spine. These results were discussed with the trauma surgeon at the time of interpretation on 04/20/2019 at 8:18 pm. Electronically Signed   By: Charlett Nose M.D.   On: 04/20/2019 20:19   Ct Chest W Contrast  Result Date: 04/20/2019 CLINICAL DATA:  Chest trauma EXAM: CT CHEST, ABDOMEN, AND PELVIS WITH CONTRAST TECHNIQUE: Multidetector CT imaging of the chest, abdomen and pelvis was performed following the standard protocol during bolus administration of intravenous contrast. CONTRAST:  OMNIPAQUE IOHEXOL 300 MG/ML  SOLN COMPARISON:  None. FINDINGS: CT CHEST FINDINGS Cardiovascular: Heart is normal size. Aorta is normal caliber. No evidence of aortic injury. Mediastinum/Nodes: No mediastinal, hilar, or axillary adenopathy. Trachea and esophagus are unremarkable. Endotracheal tube in the midtrachea. No mediastinal hematoma. Lungs/Pleura: Small right side  pneumothorax, best seen anteriorly over the mid and lower right lung. There are multiple areas of ground-glass opacities in the right lung compatible with contusion. Air-filled cystic areas noted within the ground-glass opacity in the right upper lobe, likely pneumatocele. Minimal areas of ground-glass density also noted in the left lung, left lower lobe, likely contusion. Musculoskeletal: Fractures noted through the anterior right ribs or costochondral junction involving the 5th through 9 anterior ribs. Chest wall soft tissues unremarkable. CT ABDOMEN PELVIS FINDINGS Hepatobiliary: No hepatic injury or perihepatic hematoma. Gallbladder is unremarkable Pancreas: No focal abnormality or ductal dilatation. Spleen: No splenic injury or perisplenic hematoma. Adrenals/Urinary Tract: No adrenal hemorrhage or renal injury identified. Bladder is unremarkable. Stomach/Bowel: NG tube is in the stomach with the tip in the distal stomach. Stomach, large and small bowel grossly unremarkable. Normal appendix. Vascular/Lymphatic: No evidence of aneurysm or adenopathy. No evidence of aortic injury. Reproductive: No visible focal abnormality. Other: No free fluid or free air. Musculoskeletal: Fracture noted through the right femoral greater trochanter. This is not seen crossing the femoral neck. Avulsion fracture off of the lateral ischium/superior acetabulum. There is a soft tissue hematoma overlying the right greater trochanter. IMPRESSION: Multifocal ground-glass opacities extent left lung compatible noted in the right lung and to a lesser with contusions. Cystic area seen within the  ground-glass opacity in the right upper lobe compatible pneumatocele. Small right pneumothorax, 5-10%. Fracture through the anterior ribs or costochondral junctions of the right 5th through 9th ribs. No acute findings or evidence of solid organ injury in the abdomen or pelvis. Fracture through the right femoral greater trochanter. Avulsion fracture  off of the right ischium superior to the right acetabulum. Right lateral hip soft tissue hematoma. These results were discussed with the trauma surgeon at the time of interpretation on 04/20/2019 at 8:32 pm. Electronically Signed   By: Rolm Baptise M.D.   On: 04/20/2019 20:32   Ct Cervical Spine Wo Contrast  Result Date: 04/20/2019 CLINICAL DATA:  Level 1 trauma. EXAM: CT HEAD WITHOUT CONTRAST CT MAXILLOFACIAL WITHOUT CONTRAST CT CERVICAL SPINE WITHOUT CONTRAST TECHNIQUE: Multidetector CT imaging of the head, cervical spine, and maxillofacial structures were performed using the standard protocol without intravenous contrast. Multiplanar CT image reconstructions of the cervical spine and maxillofacial structures were also generated. COMPARISON:  None. FINDINGS: CT HEAD FINDINGS Brain: There is a small amount of blood along the mid and posterior falx. This is likely subarachnoid. Small amount of subarachnoid blood in the left parietal region. Blood noted along the surface of the right frontal lobe just anterior to the severe fissure. It is difficult to determine this is parenchymal or sulcal/subarachnoid. Small parenchymal hemorrhage in the posterior right parietal lobe. Small amount of layering blood dependently in the posterior horn of the right lateral ventricle. No mass effect or midline shift. No hydrocephalus. Vascular: No hyperdense vessel or unexpected calcification. Skull: No acute calvarial abnormality. Other: Soft tissue laceration in the posterior right parietal scalp. CT MAXILLOFACIAL FINDINGS Osseous: No fracture or mandibular dislocation. No destructive process. Orbits: Negative. No traumatic or inflammatory finding. Sinuses: Air-fluid level layering in the sphenoid sinus. This could be related to sinusitis or intubation. Endotracheal tube and OG tube seen in place. Soft tissues: Negative CT CERVICAL SPINE FINDINGS Alignment: Normal Skull base and vertebrae: No acute fracture. No primary bone lesion or  focal pathologic process. Soft tissues and spinal canal: No prevertebral fluid or swelling. No visible canal hematoma. Disc levels: Diffuse degenerative disc disease with disc space narrowing and spurring. Upper chest: No acute findings Other: None IMPRESSION: Multiple small areas of subarachnoid hemorrhage noted along the falx, in the left parietal region, and possibly lateral right frontal region. This frontal area could also be small parenchymal contusion. There is a parenchymal contusion within the posterior left parietal lobe. Small amount of layering intraventricular blood. No hydrocephalus, mass effect or midline shift. No acute bony abnormality in the face or cervical spine. These results were discussed with the trauma surgeon at the time of interpretation on 04/20/2019 at 8:18 pm. Electronically Signed   By: Rolm Baptise M.D.   On: 04/20/2019 20:19   Ct Abdomen Pelvis W Contrast  Result Date: 04/20/2019 CLINICAL DATA:  Chest trauma EXAM: CT CHEST, ABDOMEN, AND PELVIS WITH CONTRAST TECHNIQUE: Multidetector CT imaging of the chest, abdomen and pelvis was performed following the standard protocol during bolus administration of intravenous contrast. CONTRAST:  138mL OMNIPAQUE IOHEXOL 300 MG/ML  SOLN COMPARISON:  None. FINDINGS: CT CHEST FINDINGS Cardiovascular: Heart is normal size. Aorta is normal caliber. No evidence of aortic injury. Mediastinum/Nodes: No mediastinal, hilar, or axillary adenopathy. Trachea and esophagus are unremarkable. Endotracheal tube in the midtrachea. No mediastinal hematoma. Lungs/Pleura: Small right side pneumothorax, best seen anteriorly over the mid and lower right lung. There are multiple areas of ground-glass opacities in the right  lung compatible with contusion. Air-filled cystic areas noted within the ground-glass opacity in the right upper lobe, likely pneumatocele. Minimal areas of ground-glass density also noted in the left lung, left lower lobe, likely contusion.  Musculoskeletal: Fractures noted through the anterior right ribs or costochondral junction involving the 5th through 9 anterior ribs. Chest wall soft tissues unremarkable. CT ABDOMEN PELVIS FINDINGS Hepatobiliary: No hepatic injury or perihepatic hematoma. Gallbladder is unremarkable Pancreas: No focal abnormality or ductal dilatation. Spleen: No splenic injury or perisplenic hematoma. Adrenals/Urinary Tract: No adrenal hemorrhage or renal injury identified. Bladder is unremarkable. Stomach/Bowel: NG tube is in the stomach with the tip in the distal stomach. Stomach, large and small bowel grossly unremarkable. Normal appendix. Vascular/Lymphatic: No evidence of aneurysm or adenopathy. No evidence of aortic injury. Reproductive: No visible focal abnormality. Other: No free fluid or free air. Musculoskeletal: Fracture noted through the right femoral greater trochanter. This is not seen crossing the femoral neck. Avulsion fracture off of the lateral ischium/superior acetabulum. There is a soft tissue hematoma overlying the right greater trochanter. IMPRESSION: Multifocal ground-glass opacities extent left lung compatible noted in the right lung and to a lesser with contusions. Cystic area seen within the ground-glass opacity in the right upper lobe compatible pneumatocele. Small right pneumothorax, 5-10%. Fracture through the anterior ribs or costochondral junctions of the right 5th through 9th ribs. No acute findings or evidence of solid organ injury in the abdomen or pelvis. Fracture through the right femoral greater trochanter. Avulsion fracture off of the right ischium superior to the right acetabulum. Right lateral hip soft tissue hematoma. These results were discussed with the trauma surgeon at the time of interpretation on 04/20/2019 at 8:32 pm. Electronically Signed   By: Charlett NoseKevin  Dover M.D.   On: 04/20/2019 20:32   Dg Pelvis Portable  Result Date: 04/20/2019 CLINICAL DATA:  Trauma EXAM: PORTABLE PELVIS 1-2  VIEWS COMPARISON:  None. FINDINGS: There is no evidence of pelvic fracture or diastasis. No pelvic bone lesions are seen. Soft tissue calcification along the lateral right hip likely related to old soft tissue injury. IMPRESSION: No acute bony abnormality. Electronically Signed   By: Charlett NoseKevin  Dover M.D.   On: 04/20/2019 19:20   Ct Maxillofacial Wo Contrast  Result Date: 04/20/2019 CLINICAL DATA:  Level 1 trauma. EXAM: CT HEAD WITHOUT CONTRAST CT MAXILLOFACIAL WITHOUT CONTRAST CT CERVICAL SPINE WITHOUT CONTRAST TECHNIQUE: Multidetector CT imaging of the head, cervical spine, and maxillofacial structures were performed using the standard protocol without intravenous contrast. Multiplanar CT image reconstructions of the cervical spine and maxillofacial structures were also generated. COMPARISON:  None. FINDINGS: CT HEAD FINDINGS Brain: There is a small amount of blood along the mid and posterior falx. This is likely subarachnoid. Small amount of subarachnoid blood in the left parietal region. Blood noted along the surface of the right frontal lobe just anterior to the severe fissure. It is difficult to determine this is parenchymal or sulcal/subarachnoid. Small parenchymal hemorrhage in the posterior right parietal lobe. Small amount of layering blood dependently in the posterior horn of the right lateral ventricle. No mass effect or midline shift. No hydrocephalus. Vascular: No hyperdense vessel or unexpected calcification. Skull: No acute calvarial abnormality. Other: Soft tissue laceration in the posterior right parietal scalp. CT MAXILLOFACIAL FINDINGS Osseous: No fracture or mandibular dislocation. No destructive process. Orbits: Negative. No traumatic or inflammatory finding. Sinuses: Air-fluid level layering in the sphenoid sinus. This could be related to sinusitis or intubation. Endotracheal tube and OG tube seen in place.  Soft tissues: Negative CT CERVICAL SPINE FINDINGS Alignment: Normal Skull base and  vertebrae: No acute fracture. No primary bone lesion or focal pathologic process. Soft tissues and spinal canal: No prevertebral fluid or swelling. No visible canal hematoma. Disc levels: Diffuse degenerative disc disease with disc space narrowing and spurring. Upper chest: No acute findings Other: None IMPRESSION: Multiple small areas of subarachnoid hemorrhage noted along the falx, in the left parietal region, and possibly lateral right frontal region. This frontal area could also be small parenchymal contusion. There is a parenchymal contusion within the posterior left parietal lobe. Small amount of layering intraventricular blood. No hydrocephalus, mass effect or midline shift. No acute bony abnormality in the face or cervical spine. These results were discussed with the trauma surgeon at the time of interpretation on 04/20/2019 at 8:18 pm. Electronically Signed   By: Charlett Nose M.D.   On: 04/20/2019 20:19   - pertinent xrays, CT, MRI studies were reviewed and independently interpreted  Positive ROS: All other systems have been reviewed and were otherwise negative with the exception of those mentioned in the HPI and as above.  Physical Exam: General: no acute distress Cardiovascular: No pedal edema Respiratory: intubated GI: No organomegaly, abdomen is soft and non-tender Skin: see MSK exam Neurologic: unable to perform Psychiatric: Patient is intubated Lymphatic: No axillary or cervical lymphadenopathy  MUSCULOSKELETAL:  - massive traumatic laceration on medial lower leg pretty much extending the entire length of the lower leg with exposed bone, tendon, muscle, fascia, subq tissue and skin loss - compartments soft - grossly contaminated with gross and dirt - 2+ pulses distally - withdraws to pain stimulation  Assessment: Traumatic laceration of left lower leg Right scapula body fracture Right greater trochanter fracture Avulsion fracture of right acetabulum  Plan: - to OR for I&D  and VAC, will need serial debridements and possible STSG - informed consent obtained from fiancee - ancef, gent, pcn G ordered - WBAT RLE and RUE, sling for comfort  Thank you for the consult and the opportunity to see Mr. Luke Dana Glee Arvin, MD 8:51 PM

## 2019-04-20 NOTE — ED Notes (Signed)
Unable to end trauma documentation. Pt taken to OR at 2125.
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
## 2019-04-20 NOTE — Transfer of Care (Signed)
Immediate Anesthesia Transfer of Care Note  Patient: Luke Choi  Procedure(s) Performed: IRRIGATION AND DEBRIDEMENT EXTREMITY AND WOUND VAC PLACEMENT (Left Leg Lower) Repair Complex Lacerations (Left Leg Lower)  Patient Location: PACU  Anesthesia Type:General  Level of Consciousness: sedated and responds to stimulation  Airway & Oxygen Therapy: Patient remains intubated per anesthesia plan and Patient placed on Ventilator (see vital sign flow sheet for setting)  Post-op Assessment: Report given to RN, Post -op Vital signs reviewed and stable and Patient moving all extremities X 4  Post vital signs: Reviewed and stable  Last Vitals:  Vitals Value Taken Time  BP 118/70 04/20/19 2320  Temp 37.7 C 04/20/19 2328  Pulse 124 04/20/19 2328  Resp 25 04/20/19 2328  SpO2 100 % 04/20/19 2328  Vitals shown include unvalidated device data.  Last Pain: There were no vitals filed for this visit.       Complications: No apparent anesthesia complications
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
## 2019-04-20 NOTE — Progress Notes (Signed)
Patient ID: Luke Choi, male   DOB: 1971-05-30, 48 y.o.   MRN: [id number] Scan reviewed, minimal SAH/IVH no mass effect, cisterns open and sulci not effaced.  Observe with serial neuro checks for now, repeat CT in am

## 2019-04-21 ENCOUNTER — Encounter (HOSPITAL_COMMUNITY): Payer: Self-pay | Admitting: Orthopaedic Surgery

## 2019-04-21 ENCOUNTER — Inpatient Hospital Stay (HOSPITAL_COMMUNITY): Payer: BC Managed Care – PPO

## 2019-04-21 DIAGNOSIS — S81812A Laceration without foreign body, left lower leg, initial encounter: Secondary | ICD-10-CM

## 2019-04-21 HISTORY — DX: Laceration without foreign body, left lower leg, initial encounter: S81.812A

## 2019-04-21 LAB — CBC
HCT: 33.7 % — ABNORMAL LOW (ref 39.0–52.0)
Hemoglobin: 11.2 g/dL — ABNORMAL LOW (ref 13.0–17.0)
MCH: 30.3 pg (ref 26.0–34.0)
MCHC: 33.2 g/dL (ref 30.0–36.0)
MCV: 91.1 fL (ref 80.0–100.0)
Platelets: 213 10*3/uL (ref 150–400)
RBC: 3.7 MIL/uL — ABNORMAL LOW (ref 4.22–5.81)
RDW: 12.6 % (ref 11.5–15.5)
WBC: 19.1 10*3/uL — ABNORMAL HIGH (ref 4.0–10.5)
nRBC: 0 % (ref 0.0–0.2)

## 2019-04-21 LAB — BASIC METABOLIC PANEL
Anion gap: 10 (ref 5–15)
BUN: 11 mg/dL (ref 6–20)
CO2: 23 mmol/L (ref 22–32)
Calcium: 8.1 mg/dL — ABNORMAL LOW (ref 8.9–10.3)
Chloride: 104 mmol/L (ref 98–111)
Creatinine, Ser: 0.98 mg/dL (ref 0.61–1.24)
GFR calc Af Amer: >60 mL/min (ref >60)
GFR calc non Af Amer: >60 mL/min (ref >60)
Glucose, Bld: 134 mg/dL — ABNORMAL HIGH (ref 70–99)
Potassium: 4.3 mmol/L (ref 3.5–5.1)
Sodium: 137 mmol/L (ref 135–145)

## 2019-04-21 LAB — TRIGLYCERIDES: Triglycerides: 112 mg/dL (ref <150)

## 2019-04-21 LAB — HIV ANTIBODY (ROUTINE TESTING W REFLEX): HIV Screen 4th Generation wRfx: NONREACTIVE

## 2019-04-21 LAB — SAMPLE TO BLOOD BANK

## 2019-04-21 LAB — MRSA PCR SCREENING: MRSA by PCR: NEGATIVE

## 2019-04-21 MED ORDER — VITAMIN B-1 100 MG PO TABS
100.0000 mg | ORAL_TABLET | Freq: Every day | ORAL | Status: DC
  Administered 2019-04-21 – 2019-04-26 (×5): 100 mg
  Filled 2019-04-21 (×6): qty 1

## 2019-04-21 MED ORDER — QUETIAPINE FUMARATE 25 MG PO TABS
50.0000 mg | ORAL_TABLET | Freq: Two times a day (BID) | ORAL | Status: DC
  Filled 2019-04-21: qty 2

## 2019-04-21 MED ORDER — ADULT MULTIVITAMIN W/MINERALS CH
1.0000 | ORAL_TABLET | Freq: Every day | ORAL | Status: DC
  Administered 2019-04-21 – 2019-06-08 (×44): 1
  Filled 2019-04-21 (×45): qty 1

## 2019-04-21 MED ORDER — DEXMEDETOMIDINE HCL IN NACL 400 MCG/100ML IV SOLN
0.4000 ug/kg/h | INTRAVENOUS | Status: DC
  Administered 2019-04-21: 0.5 ug/kg/h via INTRAVENOUS
  Administered 2019-04-22: 0.4 ug/kg/h via INTRAVENOUS
  Administered 2019-04-22 – 2019-04-23 (×3): 0.6 ug/kg/h via INTRAVENOUS
  Administered 2019-04-24: 0.8 ug/kg/h via INTRAVENOUS
  Administered 2019-04-24: 1 ug/kg/h via INTRAVENOUS
  Administered 2019-04-24: 0.6 ug/kg/h via INTRAVENOUS
  Administered 2019-04-25: 0.4 ug/kg/h via INTRAVENOUS
  Administered 2019-04-25 (×2): 0.8 ug/kg/h via INTRAVENOUS
  Administered 2019-04-25: 0.6 ug/kg/h via INTRAVENOUS
  Administered 2019-04-26: 0.8 ug/kg/h via INTRAVENOUS
  Administered 2019-04-26: 1 ug/kg/h via INTRAVENOUS
  Administered 2019-04-26 (×2): 0.8 ug/kg/h via INTRAVENOUS
  Administered 2019-04-27: 0.4 ug/kg/h via INTRAVENOUS
  Administered 2019-04-27: 0.2 ug/kg/h via INTRAVENOUS
  Administered 2019-04-27: 1 ug/kg/h via INTRAVENOUS
  Administered 2019-04-28 (×2): 1.2 ug/kg/h via INTRAVENOUS
  Filled 2019-04-21 (×22): qty 100

## 2019-04-21 MED ORDER — ALBUMIN HUMAN 5 % IV SOLN
25.0000 g | Freq: Once | INTRAVENOUS | Status: AC
  Administered 2019-04-21: 25 g via INTRAVENOUS
  Filled 2019-04-21: qty 500

## 2019-04-21 MED ORDER — CHLORHEXIDINE GLUCONATE 0.12% ORAL RINSE (MEDLINE KIT)
15.0000 mL | Freq: Two times a day (BID) | OROMUCOSAL | Status: DC
  Administered 2019-04-21 – 2019-06-05 (×90): 15 mL via OROMUCOSAL

## 2019-04-21 MED ORDER — ORAL CARE MOUTH RINSE
15.0000 mL | OROMUCOSAL | Status: DC
  Administered 2019-04-21 – 2019-06-05 (×437): 15 mL via OROMUCOSAL

## 2019-04-21 MED ORDER — PANTOPRAZOLE SODIUM 40 MG IV SOLR
40.0000 mg | INTRAVENOUS | Status: DC
  Administered 2019-04-21 – 2019-05-09 (×19): 40 mg via INTRAVENOUS
  Filled 2019-04-21 (×19): qty 40

## 2019-04-21 MED ORDER — CLONAZEPAM 0.5 MG PO TABS
0.5000 mg | ORAL_TABLET | Freq: Two times a day (BID) | ORAL | Status: DC
  Administered 2019-04-21 – 2019-04-24 (×7): 0.5 mg
  Filled 2019-04-21 (×7): qty 1

## 2019-04-21 MED ORDER — FOLIC ACID 1 MG PO TABS
1.0000 mg | ORAL_TABLET | Freq: Every day | ORAL | Status: DC
  Administered 2019-04-21 – 2019-04-26 (×5): 1 mg
  Filled 2019-04-21 (×6): qty 1

## 2019-04-21 MED ORDER — DEXMEDETOMIDINE HCL IN NACL 200 MCG/50ML IV SOLN
0.4000 ug/kg/h | INTRAVENOUS | Status: DC
  Administered 2019-04-21: 0.4 ug/kg/h via INTRAVENOUS
  Filled 2019-04-21: qty 50

## 2019-04-21 MED ORDER — QUETIAPINE FUMARATE 25 MG PO TABS
50.0000 mg | ORAL_TABLET | Freq: Two times a day (BID) | ORAL | Status: DC
  Administered 2019-04-21 – 2019-04-24 (×7): 50 mg
  Filled 2019-04-21 (×6): qty 2

## 2019-04-21 NOTE — Progress Notes (Signed)
  Patient transported to CT scan and back to 4N30 without incidence.
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
## 2019-04-21 NOTE — Progress Notes (Signed)
Patient ID: Luke Choi, male   DOB: [birth year], 48 y.o.   MRN: [id number] Patient remains wildly purposeful and combative when light on sedation but does not follow commands  Repeat CT scan stable with multiple small areas of punctate subarachnoid hemorrhage or contusion.  No new neurosurgical recommendations at this time continue c-collar

## 2019-04-21 NOTE — Progress Notes (Signed)
PT Cancellation Note  Patient Details Name: Luke Choi MRN: [id number] DOB: 06/09/1971   Cancelled Treatment:    Reason Eval/Treat Not Completed: Patient not medically ready (remains intubated in preparation for OR tomorrow).  Ellamae Sia, PT, DPT Acute Rehabilitation Services Pager [phone] Office [phone]    Willy Eddy 04/21/2019, 11:08 AM

## 2019-04-21 NOTE — Progress Notes (Signed)
OT Cancellation Note  Patient Details Name: Luke Choi MRN: [id number] DOB: 1970-11-20   Cancelled Treatment:    Reason Eval/Treat Not Completed: Medical issues which prohibited therapy; remains intubated, noted plan for OR tomorrow. Will follow.   Lou Cal, OT Supplemental Rehabilitation Services Pager [phone] Office [phone]   Raymondo Band 04/21/2019, 1:50 PM

## 2019-04-21 NOTE — Progress Notes (Addendum)
Patient is stable.  He is agitated on light sedation.  He will wiggle toes and move ankle on command.  Wound VAC intact.  Plan is for repeat I&D tomorrow afternoon.  Continue to elevate LLE at all times to decrease swelling.  WBAT RUE and RLE.  No hip abduction for 6 weeks  N. Eduard Roux, MD [facility] [phone] 7:54 AM

## 2019-04-21 NOTE — Progress Notes (Signed)
SLP Cancellation Note  Patient Details Name: Luke Choi MRN: [id number] DOB: 04/24/1971   Cancelled treatment:        Pt intubated. Will follow.    Houston Siren 04/21/2019, 8:01 AM   Orbie Pyo Colvin Caroli.Ed Risk analyst [phone] Office [phone]

## 2019-04-21 NOTE — Anesthesia Postprocedure Evaluation (Signed)
Anesthesia Post Note  Patient: Luke Choi  Procedure(s) Performed: IRRIGATION AND DEBRIDEMENT EXTREMITY AND WOUND VAC PLACEMENT (Left Leg Lower) Repair Complex Lacerations (Left Leg Lower)     Patient location during evaluation: SICU Anesthesia Type: General Level of consciousness: sedated Pain management: pain level controlled Vital Signs Assessment: post-procedure vital signs reviewed and stable Respiratory status: patient remains intubated per anesthesia plan Cardiovascular status: stable Postop Assessment: no apparent nausea or vomiting Anesthetic complications: no    Last Vitals:  Vitals:   04/21/19 0729 04/21/19 0733  BP:    Pulse: 92   Resp: 13   Temp: 37.8 C   SpO2: 100% 100%    Last Pain:  Vitals:   04/20/19 2317  TempSrc: Bladder                 Luke Choi
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
## 2019-04-22 ENCOUNTER — Inpatient Hospital Stay (HOSPITAL_COMMUNITY): Payer: BC Managed Care – PPO | Admitting: Certified Registered Nurse Anesthetist

## 2019-04-22 ENCOUNTER — Inpatient Hospital Stay (HOSPITAL_COMMUNITY): Payer: BC Managed Care – PPO

## 2019-04-22 ENCOUNTER — Encounter (HOSPITAL_COMMUNITY): Admission: EM | Disposition: A | Discharge status: Rehabilitation Facility | Payer: Self-pay | Source: Home / Self Care

## 2019-04-22 DIAGNOSIS — S8251XA Displaced fracture of medial malleolus of right tibia, initial encounter for closed fracture: Secondary | ICD-10-CM

## 2019-04-22 DIAGNOSIS — S81812D Laceration without foreign body, left lower leg, subsequent encounter: Secondary | ICD-10-CM | POA: Diagnosis not present

## 2019-04-22 HISTORY — PX: INCISION AND DRAINAGE: SHX5863

## 2019-04-22 HISTORY — DX: Displaced fracture of medial malleolus of right tibia, initial encounter for closed fracture: S82.51XA

## 2019-04-22 HISTORY — PX: ORIF ANKLE FRACTURE: SHX5408

## 2019-04-22 LAB — ABO/RH: ABO/RH(D): B POS

## 2019-04-22 LAB — CBC
HCT: 22.1 % — ABNORMAL LOW (ref 39.0–52.0)
HCT: 24.6 % — ABNORMAL LOW (ref 39.0–52.0)
Hemoglobin: 7.2 g/dL — ABNORMAL LOW (ref 13.0–17.0)
Hemoglobin: 8.3 g/dL — ABNORMAL LOW (ref 13.0–17.0)
MCH: 30.4 pg (ref 26.0–34.0)
MCH: 31 pg (ref 26.0–34.0)
MCHC: 32.6 g/dL (ref 30.0–36.0)
MCHC: 33.7 g/dL (ref 30.0–36.0)
MCV: 93.2 fL (ref 80.0–100.0)
MCV: 91.8 fL (ref 80.0–100.0)
Platelets: 129 10*3/uL — ABNORMAL LOW (ref 150–400)
Platelets: 131 10*3/uL — ABNORMAL LOW (ref 150–400)
RBC: 2.37 MIL/uL — ABNORMAL LOW (ref 4.22–5.81)
RBC: 2.68 MIL/uL — ABNORMAL LOW (ref 4.22–5.81)
RDW: 12.5 % (ref 11.5–15.5)
RDW: 12.5 % (ref 11.5–15.5)
WBC: 9.1 10*3/uL (ref 4.0–10.5)
WBC: 12.7 10*3/uL — ABNORMAL HIGH (ref 4.0–10.5)
nRBC: 0 % (ref 0.0–0.2)
nRBC: 0 % (ref 0.0–0.2)

## 2019-04-22 LAB — BASIC METABOLIC PANEL
Anion gap: 7 (ref 5–15)
BUN: 10 mg/dL (ref 6–20)
CO2: 27 mmol/L (ref 22–32)
Calcium: 7.9 mg/dL — ABNORMAL LOW (ref 8.9–10.3)
Chloride: 103 mmol/L (ref 98–111)
Creatinine, Ser: 0.7 mg/dL (ref 0.61–1.24)
GFR calc Af Amer: >60 mL/min (ref >60)
GFR calc non Af Amer: >60 mL/min (ref >60)
Glucose, Bld: 111 mg/dL — ABNORMAL HIGH (ref 70–99)
Potassium: 3.8 mmol/L (ref 3.5–5.1)
Sodium: 137 mmol/L (ref 135–145)

## 2019-04-22 LAB — PREPARE RBC (CROSSMATCH)

## 2019-04-22 LAB — TRIGLYCERIDES: Triglycerides: 103 mg/dL (ref <150)

## 2019-04-22 SURGERY — INCISION AND DRAINAGE
Anesthesia: General | Site: Leg Lower | Laterality: Right

## 2019-04-22 MED ORDER — FENTANYL CITRATE (PF) 250 MCG/5ML IJ SOLN
INTRAMUSCULAR | Status: AC
  Filled 2019-04-22: qty 5

## 2019-04-22 MED ORDER — MIDAZOLAM HCL 2 MG/2ML IJ SOLN
INTRAMUSCULAR | Status: AC
  Filled 2019-04-22: qty 2

## 2019-04-22 MED ORDER — DEXMEDETOMIDINE HCL IN NACL 200 MCG/50ML IV SOLN
INTRAVENOUS | Status: AC
  Filled 2019-04-22: qty 50

## 2019-04-22 MED ORDER — SODIUM CHLORIDE 0.9 % IR SOLN
Status: DC | PRN
  Administered 2019-04-22: 1000 mL
  Administered 2019-04-22: 3000 mL

## 2019-04-22 MED ORDER — CEFAZOLIN SODIUM-DEXTROSE 2-4 GM/100ML-% IV SOLN
2.0000 g | Freq: Three times a day (TID) | INTRAVENOUS | Status: DC
  Filled 2019-04-22 (×3): qty 100

## 2019-04-22 MED ORDER — CEFAZOLIN SODIUM-DEXTROSE 2-4 GM/100ML-% IV SOLN
INTRAVENOUS | Status: AC
  Filled 2019-04-22: qty 100

## 2019-04-22 MED ORDER — MIDAZOLAM HCL 5 MG/5ML IJ SOLN
INTRAMUSCULAR | Status: DC | PRN
  Administered 2019-04-22 (×2): 2 mg via INTRAVENOUS

## 2019-04-22 MED ORDER — VECURONIUM BROMIDE 10 MG IV SOLR
10.0000 mg | Freq: Once | INTRAVENOUS | Status: AC
  Administered 2019-04-22: 10 mg via INTRAVENOUS

## 2019-04-22 MED ORDER — ACETAMINOPHEN 160 MG/5ML PO SOLN
650.0000 mg | ORAL | Status: DC | PRN
  Administered 2019-04-22 – 2019-06-07 (×46): 650 mg
  Filled 2019-04-22 (×48): qty 20.3

## 2019-04-22 MED ORDER — FENTANYL CITRATE (PF) 250 MCG/5ML IJ SOLN
INTRAMUSCULAR | Status: DC | PRN
  Administered 2019-04-22: 250 ug via INTRAVENOUS
  Administered 2019-04-22: 150 ug via INTRAVENOUS
  Administered 2019-04-22: 100 ug via INTRAVENOUS

## 2019-04-22 MED ORDER — SODIUM CHLORIDE 0.9% IV SOLUTION: Freq: Once | INTRAVENOUS | Status: DC

## 2019-04-22 MED ORDER — ROCURONIUM BROMIDE 10 MG/ML (PF) SYRINGE
PREFILLED_SYRINGE | INTRAVENOUS | Status: DC | PRN
  Administered 2019-04-22: 30 mg via INTRAVENOUS
  Administered 2019-04-22: 20 mg via INTRAVENOUS
  Administered 2019-04-22: 50 mg via INTRAVENOUS

## 2019-04-22 MED ORDER — VECURONIUM BROMIDE 10 MG IV SOLR
INTRAVENOUS | Status: AC
  Filled 2019-04-22: qty 10

## 2019-04-22 MED ORDER — CEFAZOLIN SODIUM-DEXTROSE 2-4 GM/100ML-% IV SOLN
2.0000 g | Freq: Once | INTRAVENOUS | Status: AC
  Administered 2019-04-22: 2 g via INTRAVENOUS
  Filled 2019-04-22: qty 100

## 2019-04-22 MED ORDER — INFLUENZA VAC SPLIT QUAD 0.5 ML IM SUSY
0.5000 mL | PREFILLED_SYRINGE | INTRAMUSCULAR | Status: AC
  Administered 2019-04-23: 0.5 mL via INTRAMUSCULAR
  Filled 2019-04-22 (×2): qty 0.5

## 2019-04-22 SURGICAL SUPPLY — 85 items
BANDAGE ESMARK 6X9 LF (GAUZE/BANDAGES/DRESSINGS) IMPLANT
BIT DRILL 2.4 AO COUPLING CANN (BIT) ×2 IMPLANT
BLADE SURG 15 STRL LF DISP TIS (BLADE) ×2 IMPLANT
BLADE SURG 15 STRL SS (BLADE) ×2
BNDG COHESIVE 4X5 TAN STRL (GAUZE/BANDAGES/DRESSINGS) ×6 IMPLANT
BNDG COHESIVE 6X5 TAN STRL LF (GAUZE/BANDAGES/DRESSINGS) ×8 IMPLANT
BNDG ELASTIC 3X5.8 VLCR STR LF (GAUZE/BANDAGES/DRESSINGS) IMPLANT
BNDG ELASTIC 4X5.8 VLCR STR LF (GAUZE/BANDAGES/DRESSINGS) IMPLANT
BNDG ELASTIC 6X10 VLCR STRL LF (GAUZE/BANDAGES/DRESSINGS) ×2 IMPLANT
BNDG ELASTIC 6X5.8 VLCR STR LF (GAUZE/BANDAGES/DRESSINGS) IMPLANT
BNDG ESMARK 6X9 LF (GAUZE/BANDAGES/DRESSINGS) ×4
BNDG GAUZE ELAST 4 BULKY (GAUZE/BANDAGES/DRESSINGS) ×2 IMPLANT
CANISTER SUCT 3000ML PPV (MISCELLANEOUS) ×2 IMPLANT
COVER SURGICAL LIGHT HANDLE (MISCELLANEOUS) ×6 IMPLANT
COVER WAND RF STERILE (DRAPES) ×8 IMPLANT
CUFF TOURN SGL QUICK 24 (TOURNIQUET CUFF)
CUFF TOURN SGL QUICK 34 (TOURNIQUET CUFF) ×2
CUFF TOURN SGL QUICK 42 (TOURNIQUET CUFF) IMPLANT
CUFF TRNQT CYL 24X4X16.5-23 (TOURNIQUET CUFF) IMPLANT
CUFF TRNQT CYL 34X4.125X (TOURNIQUET CUFF) IMPLANT
DRAPE C-ARM 42X72 X-RAY (DRAPES) ×4 IMPLANT
DRAPE C-ARMOR (DRAPES) ×4 IMPLANT
DRAPE EXTREMITY BILATERAL (DRAPES) ×2 IMPLANT
DRAPE EXTREMITY T 121X128X90 (DISPOSABLE) ×2 IMPLANT
DRAPE INCISE IOBAN 66X45 STRL (DRAPES) ×2 IMPLANT
DRAPE U-SHAPE 47X51 STRL (DRAPES) ×10 IMPLANT
DRSG ADAPTIC 3X8 NADH LF (GAUZE/BANDAGES/DRESSINGS) ×2 IMPLANT
DRSG PAD ABDOMINAL 8X10 ST (GAUZE/BANDAGES/DRESSINGS) ×6 IMPLANT
DRSG VAC ATS MED SENSATRAC (GAUZE/BANDAGES/DRESSINGS) ×2 IMPLANT
DURAPREP 26ML APPLICATOR (WOUND CARE) ×8 IMPLANT
ELECT CAUTERY BLADE 6.4 (BLADE) ×2 IMPLANT
ELECT REM PT RETURN 9FT ADLT (ELECTROSURGICAL) ×8
ELECTRODE REM PT RTRN 9FT ADLT (ELECTROSURGICAL) ×2 IMPLANT
GAUZE SPONGE 4X4 12PLY STRL (GAUZE/BANDAGES/DRESSINGS) ×8 IMPLANT
GAUZE XEROFORM 5X9 LF (GAUZE/BANDAGES/DRESSINGS) ×6 IMPLANT
GLOVE BIOGEL PI IND STRL 7.0 (GLOVE) ×4 IMPLANT
GLOVE BIOGEL PI INDICATOR 7.0 (GLOVE) ×4
GLOVE ECLIPSE 7.0 STRL STRAW (GLOVE) ×8 IMPLANT
GLOVE SKINSENSE NS SZ7.5 (GLOVE) ×4
GLOVE SKINSENSE STRL SZ7.5 (GLOVE) ×6 IMPLANT
GLOVE SURG SYN 7.5  E (GLOVE) ×4
GLOVE SURG SYN 7.5 E (GLOVE) ×4 IMPLANT
GLOVE SURG SYN 7.5 PF PI (GLOVE) ×4 IMPLANT
GOWN STRL REIN XL XLG (GOWN DISPOSABLE) ×12 IMPLANT
HANDPIECE INTERPULSE COAX TIP (DISPOSABLE) ×2
K-WIRE TROC 1.25X150 (WIRE) ×8
KIT BASIN OR (CUSTOM PROCEDURE TRAY) ×8 IMPLANT
KIT TURNOVER KIT B (KITS) ×8 IMPLANT
KWIRE TROC 1.25X150 (WIRE) IMPLANT
MANIFOLD NEPTUNE II (INSTRUMENTS) ×4 IMPLANT
NDL HYPO 25GX1X1/2 BEV (NEEDLE) IMPLANT
NEEDLE HYPO 25GX1X1/2 BEV (NEEDLE) IMPLANT
NS IRRIG 1000ML POUR BTL (IV SOLUTION) ×4 IMPLANT
PACK ORTHO EXTREMITY (CUSTOM PROCEDURE TRAY) ×8 IMPLANT
PAD ARMBOARD 7.5X6 YLW CONV (MISCELLANEOUS) ×14 IMPLANT
PAD CAST 4YDX4 CTTN HI CHSV (CAST SUPPLIES) IMPLANT
PADDING CAST ABS 4INX4YD NS (CAST SUPPLIES)
PADDING CAST ABS COTTON 4X4 ST (CAST SUPPLIES) ×2 IMPLANT
PADDING CAST COTTON 4X4 STRL (CAST SUPPLIES) ×2
PADDING CAST COTTON 6X4 STRL (CAST SUPPLIES) ×6 IMPLANT
SCREW CANN 4.0X36MM (Screw) ×2 IMPLANT
SCREW CANN FT 36X4 NS SM (Screw) IMPLANT
SCREW CANN PT 4X36 NS (Screw) IMPLANT
SCREW CANNULATED NS 4MMX36MM (Screw) ×4 IMPLANT
SET HNDPC FAN SPRY TIP SCT (DISPOSABLE) IMPLANT
SPONGE LAP 18X18 RF (DISPOSABLE) ×2 IMPLANT
STOCKINETTE IMPERVIOUS 9X36 MD (GAUZE/BANDAGES/DRESSINGS) ×2 IMPLANT
STOCKINETTE IMPERVIOUS LG (DRAPES) ×2 IMPLANT
SUCTION FRAZIER HANDLE 10FR (MISCELLANEOUS) ×2
SUCTION TUBE FRAZIER 10FR DISP (MISCELLANEOUS) ×2 IMPLANT
SUT ETHILON 2 0 FS 18 (SUTURE) ×4 IMPLANT
SUT ETHILON 2 0 PSLX (SUTURE) IMPLANT
SUT ETHILON 3 0 PS 1 (SUTURE) ×2 IMPLANT
SUT VIC AB 2-0 CT1 27 (SUTURE)
SUT VIC AB 2-0 CT1 TAPERPNT 27 (SUTURE) IMPLANT
SUT VIC AB 2-0 FS1 27 (SUTURE) ×4 IMPLANT
SWAB CULTURE ESWAB REG 1ML (MISCELLANEOUS) IMPLANT
SYR CONTROL 10ML LL (SYRINGE) IMPLANT
TOWEL GREEN STERILE (TOWEL DISPOSABLE) ×12 IMPLANT
TOWEL GREEN STERILE FF (TOWEL DISPOSABLE) ×6 IMPLANT
TUBE CONNECTING 12'X1/4 (SUCTIONS) ×2
TUBE CONNECTING 12X1/4 (SUCTIONS) ×6 IMPLANT
UNDERPAD 30X30 (UNDERPADS AND DIAPERS) ×12 IMPLANT
WATER STERILE IRR 1000ML POUR (IV SOLUTION) ×2 IMPLANT
YANKAUER SUCT BULB TIP NO VENT (SUCTIONS) ×4 IMPLANT
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
## 2019-04-22 NOTE — Plan of Care (Signed)
  Problem: Elimination: Goal: Will not experience complications related to urinary retention Outcome: Progressing   Problem: Pain Managment: Goal: General experience of comfort will improve Outcome: Progressing   

## 2019-04-22 NOTE — H&P (Signed)

## 2019-04-22 NOTE — Progress Notes (Signed)
PT Cancellation Note  Patient Details Name: Luke Choi MRN: [id number] DOB: 02/18/1971   Cancelled Treatment:    Reason Eval/Treat Not Completed: Patient not medically ready;Patient at procedure or test/unavailable Pt off floor in OR. Will follow.   Marguarite Arbour A Luberta Grabinski 04/22/2019, 12:50 PM Wray Kearns, PT, DPT Acute Rehabilitation Services Pager [phone] Office [phone]

## 2019-04-22 NOTE — Anesthesia Preprocedure Evaluation (Signed)
Anesthesia Evaluation  Patient identified by MRN, date of birth, ID band Patient awake    Reviewed: Allergy & Precautions, NPO status , Patient's Chart, lab work & pertinent test results  Airway Mallampati: Intubated       Dental   Pulmonary    breath sounds clear to auscultation       Cardiovascular negative cardio ROS   Rhythm:Regular Rate:Normal     Neuro/Psych    GI/Hepatic negative GI ROS, Neg liver ROS,   Endo/Other  negative endocrine ROS  Renal/GU negative Renal ROS     Musculoskeletal   Abdominal   Peds  Hematology   Anesthesia Other Findings   Reproductive/Obstetrics                             Anesthesia Physical Anesthesia Plan  ASA: III  Anesthesia Plan: General   Post-op Pain Management:    Induction: Intravenous  PONV Risk Score and Plan: 2 and Midazolam  Airway Management Planned: Oral ETT  Additional Equipment:   Intra-op Plan:   Post-operative Plan:   Informed Consent: I have reviewed the patients History and Physical, chart, labs and discussed the procedure including the risks, benefits and alternatives for the proposed anesthesia with the patient or authorized representative who has indicated his/her understanding and acceptance.     Dental advisory given  Plan Discussed with: CRNA, Anesthesiologist and Surgeon  Anesthesia Plan Comments:         Anesthesia Quick Evaluation

## 2019-04-22 NOTE — Anesthesia Procedure Notes (Signed)
Procedure Name: Intubation Date/Time: 04/22/2019 11:57 AM Performed by: Mariea Clonts, CRNA Pre-anesthesia Checklist: Patient identified, Suction available, Patient being monitored, Timeout performed and Emergency Drugs available Patient Re-evaluated:Patient Re-evaluated prior to induction Oxygen Delivery Method: Circle system utilized Preoxygenation: Pre-oxygenation with 100% oxygen Induction Type: Inhalational induction with existing ETT Placement Confirmation: breath sounds checked- equal and bilateral and positive ETCO2 Dental Injury: Teeth and Oropharynx as per pre-operative assessment

## 2019-04-22 NOTE — Progress Notes (Signed)
Patient ID: Luke Choi, male   DOB: 07/19/1971, 48 y.o.   MRN: [id number] I met with his wife at the bedside and updated her.  Georganna Skeans, MD, MPH, FACS Trauma & General Surgery: [phone]

## 2019-04-22 NOTE — Progress Notes (Addendum)
Right ankle xrays show displaced right medial malleolus fracture.  Will plan for ORIF of this today in addition to I&D left leg.  Fiancee updated and obtained consent.

## 2019-04-22 NOTE — Progress Notes (Signed)
Pt's sats maintaining in mid/upper 80s and biting down on tube. Respiratory and MD Grandville Silos at the bedside. 10 mL vecuronium ordered to be given. Pt calm and 02 sats back to 92-94%

## 2019-04-22 NOTE — Op Note (Signed)
Date of Surgery: 04/22/2019  INDICATIONS: Mr. Boettner is a 48 y.o.-year-old male with a right ankle fracture and left leg wound;  The fiancee did consent to the procedure after discussion of the risks and benefits.  PREOPERATIVE DIAGNOSIS:  1.  Displaced right medial malleolus fracture 2.  Left lower leg wound  POSTOPERATIVE DIAGNOSIS: Same.  PROCEDURE:  1.  Open reduction internal fixation of right medial malleolus fracture 2.  Irrigation debridement of left lower leg wound including bone, periosteum, subcutaneous tissue, skin 5 x 4 cm 3.  Application of wound VAC less than 50 cm  SURGEON: N. Eduard Roux, M.D.  ASSIST: Ciro Backer Rosalie, [place]; necessary for the timely completion of procedure and due to complexity of procedure.  ANESTHESIA:  general  IV FLUIDS AND URINE: See anesthesia.  ESTIMATED BLOOD LOSS: Minimal mL.  IMPLANTS: Biomet 4.0 mm cannulated screws  DRAINS: Wound VAC  COMPLICATIONS: see description of procedure.  DESCRIPTION OF PROCEDURE: The patient was brought to the operating room and placed supine on the operating table.  The patient had been signed prior to the procedure and this was documented. The patient had the anesthesia placed by the anesthesiologist.  A time-out was performed to confirm that this was the correct patient, site, side and location. The patient did receive antibiotics prior to the incision and was re-dosed during the procedure as needed at indicated intervals.  A tourniquet was placed on the upper right thigh.  The patient had both lower extremities prepped and draped in standard sterile fashion.    We first began with the ankle fracture.  Incision was made over the medial malleolus.  Dissection was carried down through the subcutaneous tissue.  Saphenous nerves and veins were identified and mobilized and protected.  We continued our dissection down onto the periosteum.  This was then sharply elevated off of the medial malleolus..  The  fracture line was visualized.  This was then booked open and entrapped periosteum was removed.  The ankle joint itself did not show any evidence of chondral injury or loose bodies.  The fracture was then reduced and held with a tenaculum clamp.  2 parallel K wires were then advanced across the fracture using fluoroscopic guidance.  The near cortex was then drilled and a 36 mm partially-threaded screw was advanced over the anterior K wire to gain compression across the fracture.  I then placed a 36 mm fully threaded screw over the posterior K wire.  Each screw had excellent purchase.  The K wires were removed.  The wound was then thoroughly irrigated.  The skin was closed with interrupted 3-0 nylon sutures.  Sterile dressings were applied.  Patient tolerated the procedure well had no immediate complications.  We then turned our attention to the left leg wound.  Excisional debridement of the skin, periosteum, subcutaneous tissue, bone was performed using a rondure and 15 blade.  This was then thoroughly irrigated with pulse lavage.  The previous closure of the traumatic laceration was intact and did not exhibit any signs of necrosis.  A wound VAC was then placed back over the open wound and the traumatic laceration.  Pressure was set to -75 mmHg.  Patient tolerated procedure well and no any complications.  POSTOPERATIVE PLAN: Patient is weight-bear as tolerated to the right lower extremity.  He will follow-up in the office in 2 weeks for suture removal.  In terms of the left leg wound he will need wound VAC changes to the open wound every  Monday Wednesday Friday with a home health nurse.  He will also need a [employer] home wound VAC.  I will see him back in the office in 2 weeks for this as well.  Mayra ReelN. Michael Matisyn Cabeza, MD 1:18 PM

## 2019-04-22 NOTE — Transfer of Care (Signed)
Immediate Anesthesia Transfer of Care Note  Patient: Zandyr Barnhill  Procedure(s) Performed: INCISION AND DRAINAGE Left lower leg wounds. (Left Leg Lower) Open Reduction Internal Fixation (Orif) Medial Malleolus Fracture. (Right Ankle)  Patient Location: ICU  Anesthesia Type:General  Level of Consciousness: sedated and unresponsive  Airway & Oxygen Therapy: Patient remains intubated per anesthesia plan and Patient placed on Ventilator (see vital sign flow sheet for setting)  Post-op Assessment: Report given to RN and Post -op Vital signs reviewed and stable  Post vital signs: Reviewed and stable  Last Vitals:  Vitals Value Taken Time  BP    Temp 36.8 C 04/22/19 1335  Pulse 95 04/22/19 1335  Resp 15 04/22/19 1335  SpO2 100 % 04/22/19 1335  Vitals shown include unvalidated device data.  Last Pain:  Vitals:   04/22/19 1136  TempSrc: Bladder         Complications: No apparent anesthesia complications

## 2019-04-22 NOTE — Anesthesia Postprocedure Evaluation (Signed)
Anesthesia Post Note  Patient: Luke Choi  Procedure(s) Performed: INCISION AND DRAINAGE Left lower leg wounds. (Left Leg Lower) Open Reduction Internal Fixation (Orif) Medial Malleolus Fracture. (Right Ankle)     Patient location during evaluation: ICU Anesthesia Type: General Level of consciousness: patient remains intubated per anesthesia plan Pain management: satisfactory to patient Vital Signs Assessment: post-procedure vital signs reviewed and stable Respiratory status: patient remains intubated per anesthesia plan    Last Vitals:  Vitals:   04/22/19 1341 04/22/19 1400  BP: 114/70 (!) 170/69  Pulse: 97 (!) 101  Resp: 15 16  Temp: 36.9 C 37.1 C  SpO2: 98% 94%    Last Pain:  Vitals:   04/22/19 1341  TempSrc: Bladder                 Rory Xiang

## 2019-04-23 ENCOUNTER — Inpatient Hospital Stay (HOSPITAL_COMMUNITY): Payer: BC Managed Care – PPO

## 2019-04-23 LAB — CBC
HCT: 23.4 % — ABNORMAL LOW (ref 39.0–52.0)
Hemoglobin: 7.7 g/dL — ABNORMAL LOW (ref 13.0–17.0)
MCH: 31.2 pg (ref 26.0–34.0)
MCHC: 32.9 g/dL (ref 30.0–36.0)
MCV: 94.7 fL (ref 80.0–100.0)
Platelets: 139 10*3/uL — ABNORMAL LOW (ref 150–400)
RBC: 2.47 MIL/uL — ABNORMAL LOW (ref 4.22–5.81)
RDW: 12.7 % (ref 11.5–15.5)
WBC: 11.8 10*3/uL — ABNORMAL HIGH (ref 4.0–10.5)
nRBC: 0 % (ref 0.0–0.2)

## 2019-04-23 LAB — BASIC METABOLIC PANEL
Anion gap: 8 (ref 5–15)
BUN: 8 mg/dL (ref 6–20)
CO2: 28 mmol/L (ref 22–32)
Calcium: 7.9 mg/dL — ABNORMAL LOW (ref 8.9–10.3)
Chloride: 101 mmol/L (ref 98–111)
Creatinine, Ser: 0.81 mg/dL (ref 0.61–1.24)
GFR calc Af Amer: >60 mL/min (ref >60)
GFR calc non Af Amer: >60 mL/min (ref >60)
Glucose, Bld: 96 mg/dL (ref 70–99)
Potassium: 3.7 mmol/L (ref 3.5–5.1)
Sodium: 137 mmol/L (ref 135–145)

## 2019-04-23 LAB — TYPE AND SCREEN
ABO/RH(D): B POS
Antibody Screen: NEGATIVE
Unit division: 0

## 2019-04-23 LAB — BPAM RBC
Blood Product Expiration Date: 202009232359
ISSUE DATE / TIME: 202009111107
Unit Type and Rh: 7300

## 2019-04-23 LAB — TRIGLYCERIDES
Triglycerides: 116 mg/dL (ref <150)
Triglycerides: 93 mg/dL (ref <150)

## 2019-04-23 MED ORDER — POTASSIUM CHLORIDE IN NACL 20-0.45 MEQ/L-% IV SOLN
INTRAVENOUS | Status: DC
  Administered 2019-04-23 – 2019-05-03 (×9): via INTRAVENOUS
  Filled 2019-04-23 (×12): qty 1000

## 2019-04-23 NOTE — Progress Notes (Signed)
Luke Choi has had two episodes of suddenly desaturating into low 70s.  The first time, Luke Choi was awake and moving around.  Vent compliance was an issue and I went up on his sedation and gave him a bolus of fentanyl.  See MAR.  Minimal ETT secretions, tube was in place, lung sounds unchanged, FiO2 increased to 100% and SpO2 increased up to 100% after a few minutes.  FiO2 weaned back down to 50% over a few hours.  The second time, vent compliance was not an issue.  Dr. Grandville Silos was paged and shortly phoned the unit.  PEEP turned to 8 and sedation switched to propofol.     Will continue to monitor.

## 2019-04-23 NOTE — Progress Notes (Signed)
PT Cancellation Note  Patient Details Name: Luke Choi MRN: [id number] DOB: Nov 06, 1970   Cancelled Treatment:    Reason Eval/Treat Not Completed: Patient not medically ready (remains intubated, sedated)  Ellamae Sia, PT, DPT [place] Pager [phone] Office [phone]    Willy Eddy 04/23/2019, 11:48 AM

## 2019-04-23 NOTE — Progress Notes (Signed)
Good VAC seal. Right ankle dressing intact.
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
## 2019-04-24 ENCOUNTER — Inpatient Hospital Stay (HOSPITAL_COMMUNITY): Payer: BC Managed Care – PPO

## 2019-04-24 LAB — CBC
HCT: 22.7 % — ABNORMAL LOW (ref 39.0–52.0)
Hemoglobin: 7.5 g/dL — ABNORMAL LOW (ref 13.0–17.0)
MCH: 30.6 pg (ref 26.0–34.0)
MCHC: 33 g/dL (ref 30.0–36.0)
MCV: 92.7 fL (ref 80.0–100.0)
Platelets: 166 10*3/uL (ref 150–400)
RBC: 2.45 MIL/uL — ABNORMAL LOW (ref 4.22–5.81)
RDW: 12.3 % (ref 11.5–15.5)
WBC: 11.1 10*3/uL — ABNORMAL HIGH (ref 4.0–10.5)
nRBC: 0 % (ref 0.0–0.2)

## 2019-04-24 LAB — GLUCOSE, CAPILLARY
Glucose-Capillary: 93 mg/dL (ref 70–99)
Glucose-Capillary: 101 mg/dL — ABNORMAL HIGH (ref 70–99)
Glucose-Capillary: 157 mg/dL — ABNORMAL HIGH (ref 70–99)

## 2019-04-24 LAB — MAGNESIUM
Magnesium: 2 mg/dL (ref 1.7–2.4)
Magnesium: 1.9 mg/dL (ref 1.7–2.4)

## 2019-04-24 LAB — PHOSPHORUS
Phosphorus: 1.3 mg/dL — ABNORMAL LOW (ref 2.5–4.6)
Phosphorus: 1 mg/dL — CL (ref 2.5–4.6)

## 2019-04-24 LAB — TRIGLYCERIDES: Triglycerides: 77 mg/dL (ref <150)

## 2019-04-24 MED ORDER — MIDAZOLAM HCL 2 MG/2ML IJ SOLN
INTRAMUSCULAR | Status: AC
  Administered 2019-04-24: 2 mg via INTRAVENOUS
  Filled 2019-04-24: qty 2

## 2019-04-24 MED ORDER — SELENIUM 200 MCG PO TABS
200.0000 ug | ORAL_TABLET | Freq: Every day | ORAL | Status: DC
  Administered 2019-04-24 – 2019-04-26 (×3): 200 ug
  Filled 2019-04-24 (×5): qty 1

## 2019-04-24 MED ORDER — POTASSIUM PHOSPHATES 15 MMOLE/5ML IV SOLN
20.0000 mmol | Freq: Once | INTRAVENOUS | Status: AC
  Administered 2019-04-24: 20 mmol via INTRAVENOUS
  Filled 2019-04-24: qty 6.67

## 2019-04-24 MED ORDER — PRO-STAT SUGAR FREE PO LIQD
30.0000 mL | Freq: Every day | ORAL | Status: DC
  Administered 2019-04-25 – 2019-04-26 (×2): 30 mL
  Filled 2019-04-24 (×2): qty 30

## 2019-04-24 MED ORDER — QUETIAPINE FUMARATE 100 MG PO TABS
100.0000 mg | ORAL_TABLET | Freq: Two times a day (BID) | ORAL | Status: DC
  Administered 2019-04-24 – 2019-04-25 (×2): 100 mg
  Filled 2019-04-24 (×2): qty 1

## 2019-04-24 MED ORDER — VECURONIUM BROMIDE 10 MG IV SOLR
10.0000 mg | Freq: Once | INTRAVENOUS | Status: AC
  Administered 2019-04-24: 22:00:00 10 mg via INTRAVENOUS

## 2019-04-24 MED ORDER — VITAL HIGH PROTEIN PO LIQD: 1000.0000 mL | ORAL | Status: DC

## 2019-04-24 MED ORDER — PRO-STAT SUGAR FREE PO LIQD
30.0000 mL | Freq: Two times a day (BID) | ORAL | Status: DC
  Administered 2019-04-24: 30 mL
  Filled 2019-04-24: qty 30

## 2019-04-24 MED ORDER — OXYCODONE HCL 5 MG/5ML PO SOLN
10.0000 mg | Freq: Four times a day (QID) | ORAL | Status: DC
  Administered 2019-04-24 – 2019-05-18 (×61): 10 mg
  Filled 2019-04-24 (×60): qty 10

## 2019-04-24 MED ORDER — PIVOT 1.5 CAL PO LIQD
1000.0000 mL | ORAL | Status: DC
  Administered 2019-04-24 – 2019-04-27 (×3): 1000 mL

## 2019-04-24 MED ORDER — ENOXAPARIN SODIUM 40 MG/0.4ML ~~LOC~~ SOLN
40.0000 mg | SUBCUTANEOUS | Status: DC
  Administered 2019-04-24 – 2019-05-07 (×14): 40 mg via SUBCUTANEOUS
  Filled 2019-04-24 (×14): qty 0.4

## 2019-04-24 MED ORDER — VECURONIUM BROMIDE 10 MG IV SOLR
INTRAVENOUS | Status: AC
  Administered 2019-04-24: 10 mg via INTRAVENOUS
  Filled 2019-04-24: qty 10

## 2019-04-24 MED ORDER — MIDAZOLAM HCL 2 MG/2ML IJ SOLN
2.0000 mg | Freq: Once | INTRAMUSCULAR | Status: AC
  Administered 2019-04-24: 22:00:00 2 mg via INTRAVENOUS

## 2019-04-24 MED ORDER — CLONAZEPAM 1 MG PO TABS
1.0000 mg | ORAL_TABLET | Freq: Two times a day (BID) | ORAL | Status: DC
  Administered 2019-04-24 – 2019-04-25 (×2): 1 mg
  Filled 2019-04-24 (×2): qty 1

## 2019-04-24 MED ORDER — VITAMIN C 500 MG PO TABS
1000.0000 mg | ORAL_TABLET | Freq: Three times a day (TID) | ORAL | Status: DC
  Administered 2019-04-24 – 2019-04-27 (×10): 1000 mg
  Filled 2019-04-24 (×10): qty 2

## 2019-04-24 NOTE — Progress Notes (Signed)
Orthopedic Tech Progress Note Patient Details:  Luke Choi 1970-10-23 [id number] So I dropped off the Cam Walker boot in patients room because the patient has a splint of some sort to the right ankle, and I was not sure if I was to remove splint before applying Boot. so the RN is going to call the Dr and confirm which to do first. Ortho Devices Type of Ortho Device: CAM walker Ortho Device/Splint Interventions: Other (comment)   Post Interventions Patient Tolerated: Other (comment) Instructions Provided: Other (comment)   [name] 04/24/2019, 9:37 AM

## 2019-04-24 NOTE — Progress Notes (Signed)
Entered room to assess SpO2 alarm in the 70s. Pt awake and fighting ventilator. Fentanyl bolus administered/rate increased with no change in agitation, propofol restarted. Ventilator dyssynchrony and desaturations persisted. Dr. Grandville Silos notified, orders received for vec and versed. After paralytic adminstration, synchrony improved, however saturations persisted in the 80s for several minutes. RT increased FiO2 to 100%. Vital signs currently stable.

## 2019-04-24 NOTE — Progress Notes (Signed)
CRITICAL VALUE ALERT  Critical Value:  Phosphorous 1.0  Date & Time Notied:  9/13 1748  Provider Notified: Thompson  Orders Received/Actions taken: no new orders at this time

## 2019-04-24 NOTE — Progress Notes (Signed)
Subjective: Patient reports on vent  Objective: Vital signs in last 24 hours: Temp:  [96.8 F (36 C)-102.6 F (39.2 C)] 100.4 F (38 C) (09/13 0800) Pulse Rate:  [67-104] 101 (09/13 0800) Resp:  [12-19] 13 (09/13 0800) BP: (95-168)/(51-84) 151/75 (09/13 0800) SpO2:  [90 %-100 %] 92 % (09/13 0800) FiO2 (%):  [70 %-100 %] 70 % (09/13 0742)  Intake/Output from previous day: 09/12 0701 - 09/13 0700 In: 2759.1 [I.V.:2759.1] Out: 1725 [Urine:1725] Intake/Output this shift: Total I/O In: 404 [I.V.:404] Out: 125 [Urine:125]  Physical Exam: Awakens, opens eyes, nods to questions.  Follows commands all four extremites  Lab Results: Recent Labs    04/23/19 0402 04/24/19 0530  WBC 11.8* 11.1*  HGB 7.7* 7.5*  HCT 23.4* 22.7*  PLT 139* 166   BMET Recent Labs    04/22/19 0136 04/23/19 0402  NA 137 137  K 3.8 3.7  CL 103 101  CO2 27 28  GLUCOSE 111* 96  BUN 10 8  CREATININE 0.70 0.81  CALCIUM 7.9* 7.9*    Studies/Results: Dg Ankle 2 Views Right  Result Date: 04/22/2019 CLINICAL DATA:  ORIF for right ankle fracture. EXAM: RIGHT ANKLE - 2 VIEW; DG C-ARM 1-60 MIN COMPARISON:  Earlier same day FINDINGS: 2 intraoperative spot fluoro images show the presence of 2 cannulated screws crossing the medial malleolar fracture site. Bony alignment is markedly improved compared to the pre reduction study. No immediate hardware complication. IMPRESSION: Intraoperative assessment during ORIF for medial malleolar fracture. Electronically Signed   By: Misty Stanley M.D.   On: 04/22/2019 14:45   Dg Chest Port 1 View  Result Date: 04/23/2019 CLINICAL DATA:  Right pulmonary contusion.  Recent chest trauma. EXAM: PORTABLE CHEST 1 VIEW COMPARISON:  04/22/2019 and chest CT 04/20/2019 FINDINGS: Endotracheal tube has tip 7 cm above the carina. Enteric tube courses into the stomach and off the film as tip is not visualized. Lungs are adequately inflated and demonstrate hazy airspace opacification over  the right base which is worse. No pneumothorax. Cardiomediastinal silhouette and remainder of the exam is unchanged. IMPRESSION: Worsening hazy airspace opacification over the right base which may represent evolving contusion/edema and atelectasis and less likely infection. No pneumothorax. Tubes and lines as described. Electronically Signed   By: Marin Olp M.D.   On: 04/23/2019 09:18   Dg Ankle Right Port  Result Date: 04/22/2019 CLINICAL DATA:  Recent trauma with ankle pain and swelling, initial encounter EXAM: PORTABLE RIGHT ANKLE - 3 VIEW COMPARISON:  None. FINDINGS: There is an oblique fracture through the distal tibia involving the medial malleolus. No significant subluxation is seen. Considerable medial soft tissue swelling is noted. No other fracture is seen. IMPRESSION: Medial malleolar fracture with associated soft tissue swelling Electronically Signed   By: Inez Catalina M.D.   On: 04/22/2019 11:51   Dg C-arm 1-60 Min  Result Date: 04/22/2019 CLINICAL DATA:  ORIF for right ankle fracture. EXAM: RIGHT ANKLE - 2 VIEW; DG C-ARM 1-60 MIN COMPARISON:  Earlier same day FINDINGS: 2 intraoperative spot fluoro images show the presence of 2 cannulated screws crossing the medial malleolar fracture site. Bony alignment is markedly improved compared to the pre reduction study. No immediate hardware complication. IMPRESSION: Intraoperative assessment during ORIF for medial malleolar fracture. Electronically Signed   By: Misty Stanley M.D.   On: 04/22/2019 14:45    Assessment/Plan: Patient is stable.  Head CT stable.  OK to start lovenox per trauma.    LOS: 4 days  Dorian HeckleJoseph D Satya Buttram, MD 04/24/2019, 9:47 AM
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
## 2019-04-24 NOTE — Progress Notes (Signed)
Called to assess patient due to desaturation.  Placed bite block. Suctioned patient for thick, tan secretions.  Patient Spo2 mid 80's.  Increased to 100% FIO2.  Patient currently 96%.
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
## 2019-04-25 ENCOUNTER — Inpatient Hospital Stay (HOSPITAL_COMMUNITY): Payer: BC Managed Care – PPO

## 2019-04-25 ENCOUNTER — Encounter (HOSPITAL_COMMUNITY): Payer: Self-pay | Admitting: Orthopaedic Surgery

## 2019-04-25 LAB — CBC
HCT: 21.3 % — ABNORMAL LOW (ref 39.0–52.0)
Hemoglobin: 7 g/dL — ABNORMAL LOW (ref 13.0–17.0)
MCH: 30.8 pg (ref 26.0–34.0)
MCHC: 32.9 g/dL (ref 30.0–36.0)
MCV: 93.8 fL (ref 80.0–100.0)
Platelets: 220 10*3/uL (ref 150–400)
RBC: 2.27 MIL/uL — ABNORMAL LOW (ref 4.22–5.81)
RDW: 12.2 % (ref 11.5–15.5)
WBC: 11.4 10*3/uL — ABNORMAL HIGH (ref 4.0–10.5)
nRBC: 0 % (ref 0.0–0.2)

## 2019-04-25 LAB — MAGNESIUM
Magnesium: 2.2 mg/dL (ref 1.7–2.4)
Magnesium: 2.1 mg/dL (ref 1.7–2.4)

## 2019-04-25 LAB — BASIC METABOLIC PANEL
Anion gap: 8 (ref 5–15)
BUN: 11 mg/dL (ref 6–20)
CO2: 29 mmol/L (ref 22–32)
Calcium: 7.8 mg/dL — ABNORMAL LOW (ref 8.9–10.3)
Chloride: 101 mmol/L (ref 98–111)
Creatinine, Ser: 0.72 mg/dL (ref 0.61–1.24)
GFR calc Af Amer: >60 mL/min (ref >60)
GFR calc non Af Amer: >60 mL/min (ref >60)
Glucose, Bld: 156 mg/dL — ABNORMAL HIGH (ref 70–99)
Potassium: 4 mmol/L (ref 3.5–5.1)
Sodium: 138 mmol/L (ref 135–145)

## 2019-04-25 LAB — GLUCOSE, CAPILLARY
Glucose-Capillary: 126 mg/dL — ABNORMAL HIGH (ref 70–99)
Glucose-Capillary: 134 mg/dL — ABNORMAL HIGH (ref 70–99)
Glucose-Capillary: 136 mg/dL — ABNORMAL HIGH (ref 70–99)
Glucose-Capillary: 136 mg/dL — ABNORMAL HIGH (ref 70–99)
Glucose-Capillary: 140 mg/dL — ABNORMAL HIGH (ref 70–99)

## 2019-04-25 LAB — PHOSPHORUS
Phosphorus: 1.8 mg/dL — ABNORMAL LOW (ref 2.5–4.6)
Phosphorus: 1.3 mg/dL — ABNORMAL LOW (ref 2.5–4.6)

## 2019-04-25 LAB — TRIGLYCERIDES: Triglycerides: 85 mg/dL (ref <150)

## 2019-04-25 MED ORDER — QUETIAPINE FUMARATE 200 MG PO TABS
200.0000 mg | ORAL_TABLET | Freq: Two times a day (BID) | ORAL | Status: DC
  Administered 2019-04-25 – 2019-04-26 (×3): 200 mg
  Filled 2019-04-25 (×3): qty 1

## 2019-04-25 MED ORDER — CLONAZEPAM 1 MG PO TABS
2.0000 mg | ORAL_TABLET | Freq: Two times a day (BID) | ORAL | Status: DC
  Administered 2019-04-25 – 2019-04-26 (×3): 2 mg
  Filled 2019-04-25 (×3): qty 2

## 2019-04-25 MED ORDER — POTASSIUM & SODIUM PHOSPHATES 280-160-250 MG PO PACK
1.0000 | PACK | Freq: Three times a day (TID) | ORAL | Status: AC
  Administered 2019-04-25 – 2019-04-26 (×4): 1
  Filled 2019-04-25 (×4): qty 1

## 2019-04-25 NOTE — Progress Notes (Signed)
Sedation increased significantly from start of shift, however still dyssynchronous and breathstacking. Spo2 88-94% on 60% FiO2.
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
## 2019-04-25 NOTE — Progress Notes (Signed)
OT Cancellation Note  Patient Details Name: Luke Choi MRN: [id number] DOB: 09-13-1970   Cancelled Treatment:    Reason Eval/Treat Not Completed: Patient not medically ready(Per RN, pt is lethargic, not following simple commands, and is biting the vent. Will return as schedule allows.)  Lequita Halt, OT Student  04/25/2019, 9:26 AM
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
## 2019-04-25 NOTE — Progress Notes (Signed)
PT Cancellation Note  Patient Details Name: Luke Choi MRN: [id number] DOB: 04-Feb-1971   Cancelled Treatment:    Reason Eval/Treat Not Completed: Patient not medically ready Holding PT today. Pt desaturating and dyssynchronous on vent. Will follow.   Marguarite Arbour A Bryann Gentz 04/25/2019, 7:57 AM Wray Kearns, PT, DPT Acute Rehabilitation Services Pager [phone] Office [phone]

## 2019-04-25 NOTE — Progress Notes (Signed)
SLP Cancellation Note  Patient Details Name: Luke Choi MRN: [id number] DOB: 12-22-1970   Cancelled treatment:       Reason Eval/Treat Not Completed: Patient not medically ready  Pt continues to be supported by ventilator. ST will continue to follow for appropriateness.  [name] 04/25/2019, 6:25 AM

## 2019-04-25 NOTE — Progress Notes (Signed)
Rehab Admissions Coordinator Note:  Per PT recommendation (from 9/13), this patient was screened by Jhonnie Garner for appropriateness for an Inpatient Acute Rehab Consult.  Noted pt remains on vent and is not yet medically ready to be considered for rehab. Will continue to follow for medical stability and tolerance to help determine most appropriate post acute care venue.   Jhonnie Garner 04/25/2019, 8:01 AM  I can be reached at [phone].

## 2019-04-25 NOTE — Consult Note (Signed)
[facility] Nurse wound consult note Patient receiving care in [facility] 4N30 Reason for Consult: LLE wound VAC change Wound type: surgical Measurement: The majority of the wound is an incision with intact sutures.  At the most distal segment there is an open wound that measures 5.5 cm x 2.2 cm x 0.6 cm Wound bed: 100% pink Drainage (amount, consistency, odor) no odor Periwound: intact, protected by some type of fenestrated non-adherent dressing all along the wound length, including the open area as measured above.  I placed a Mepitel dressing (cut in half length wise) along the entire area, then applied a narrow length of black foam over the wound.  Immediate seal obtained.  The VAC machine was on 52mmHg pressure upon my arrival, and restarted at the same setting. Val Riles, RN, MSN, CWOCN, CNS-BC, pager [phone]

## 2019-04-25 NOTE — Progress Notes (Signed)
Subjective: 3 Days Post-Op Procedure(s) (LRB): INCISION AND DRAINAGE Left lower leg wounds. (Left) Open Reduction Internal Fixation (Orif) Medial Malleolus Fracture. (Right) Patient reports pain as patient on ventilator and heavily sedated.  Not responding to commands  Objective: Vital signs in last 24 hours: Temp:  [98.1 F (36.7 C)-102.6 F (39.2 C)] 98.6 F (37 C) (09/14 0600) Pulse Rate:  [65-106] 65 (09/14 0600) Resp:  [13-19] 15 (09/14 0600) BP: (93-168)/(49-81) 111/64 (09/14 0600) SpO2:  [72 %-100 %] 96 % (09/14 0600) FiO2 (%):  [50 %-100 %] 60 % (09/14 0400) Weight:  [97.2 kg] 97.2 kg (09/14 0500)  Intake/Output from previous day: 09/13 0701 - 09/14 0700 In: 4751.6 [I.V.:3308.2; NG/GT:875; IV Piggyback:568.4] Out: 2425 [Urine:2425] Intake/Output this shift: No intake/output data recorded.  Recent Labs    04/22/19 1527 04/23/19 0402 04/24/19 0530 04/25/19 0231  HGB 8.3* 7.7* 7.5* 7.0*   Recent Labs    04/24/19 0530 04/25/19 0231  WBC 11.1* 11.4*  RBC 2.45* 2.27*  HCT 22.7* 21.3*  PLT 166 220   Recent Labs    04/23/19 0402 04/25/19 0231  NA 137 138  K 3.7 4.0  CL 101 101  CO2 28 29  BUN 8 11  CREATININE 0.81 0.72  GLUCOSE 96 156*  CALCIUM 7.9* 7.8*   No results for input(s): LABPT, INR in the last 72 hours.  Physical Exam LLE- wound vac in place and functioning properly.  40cc blood in canister. Currently, no active output. RLE- dry bandage and cam walker in place   Assessment/Plan: 3 Days Post-Op Procedure(s) (LRB): INCISION AND DRAINAGE Left lower leg wounds. (Left) Open Reduction Internal Fixation (Orif) Medial Malleolus Fracture. (Right) Up with therapy  RLE- WBAT RLE- continue dry dressing and cam walker.  No hip abduction x 6 weeks from injury LLE- NWB.  Continue wound vac with changes Monday/wednesday/friday.  Will need kci vac and hh nurse once d/c F/U with Dr. Erlinda Hong 2 weeks post-op for RLE suture removal and LLE wound  check      Aundra Dubin 04/25/2019, 7:11 AM

## 2019-04-26 ENCOUNTER — Inpatient Hospital Stay (HOSPITAL_COMMUNITY): Payer: BC Managed Care – PPO

## 2019-04-26 LAB — CBC
HCT: 22.9 % — ABNORMAL LOW (ref 39.0–52.0)
Hemoglobin: 7.7 g/dL — ABNORMAL LOW (ref 13.0–17.0)
MCH: 31.3 pg (ref 26.0–34.0)
MCHC: 33.6 g/dL (ref 30.0–36.0)
MCV: 93.1 fL (ref 80.0–100.0)
Platelets: 243 10*3/uL (ref 150–400)
RBC: 2.46 MIL/uL — ABNORMAL LOW (ref 4.22–5.81)
RDW: 12.5 % (ref 11.5–15.5)
WBC: 11.6 10*3/uL — ABNORMAL HIGH (ref 4.0–10.5)
nRBC: 0 % (ref 0.0–0.2)

## 2019-04-26 LAB — BASIC METABOLIC PANEL
Anion gap: 9 (ref 5–15)
BUN: 13 mg/dL (ref 6–20)
CO2: 30 mmol/L (ref 22–32)
Calcium: 7.7 mg/dL — ABNORMAL LOW (ref 8.9–10.3)
Chloride: 102 mmol/L (ref 98–111)
Creatinine, Ser: 0.72 mg/dL (ref 0.61–1.24)
GFR calc Af Amer: >60 mL/min (ref >60)
GFR calc non Af Amer: >60 mL/min (ref >60)
Glucose, Bld: 129 mg/dL — ABNORMAL HIGH (ref 70–99)
Potassium: 3.9 mmol/L (ref 3.5–5.1)
Sodium: 141 mmol/L (ref 135–145)

## 2019-04-26 LAB — GLUCOSE, CAPILLARY
Glucose-Capillary: 112 mg/dL — ABNORMAL HIGH (ref 70–99)
Glucose-Capillary: 116 mg/dL — ABNORMAL HIGH (ref 70–99)
Glucose-Capillary: 121 mg/dL — ABNORMAL HIGH (ref 70–99)
Glucose-Capillary: 128 mg/dL — ABNORMAL HIGH (ref 70–99)
Glucose-Capillary: 130 mg/dL — ABNORMAL HIGH (ref 70–99)
Glucose-Capillary: 132 mg/dL — ABNORMAL HIGH (ref 70–99)
Glucose-Capillary: 133 mg/dL — ABNORMAL HIGH (ref 70–99)

## 2019-04-26 LAB — TRIGLYCERIDES: Triglycerides: 83 mg/dL (ref <150)

## 2019-04-26 MED ORDER — LIDOCAINE HCL (PF) 2 % IJ SOLN
10.0000 mL | INTRAMUSCULAR | Status: AC
  Filled 2019-04-26 (×2): qty 10

## 2019-04-26 MED ORDER — FUROSEMIDE 10 MG/ML IJ SOLN
40.0000 mg | Freq: Once | INTRAMUSCULAR | Status: AC
  Administered 2019-04-26: 40 mg via INTRAVENOUS
  Filled 2019-04-26: qty 4

## 2019-04-26 MED ORDER — SODIUM CHLORIDE 0.9 % IV SOLN
2.0000 g | Freq: Three times a day (TID) | INTRAVENOUS | Status: DC
  Administered 2019-04-26 – 2019-05-05 (×27): 2 g via INTRAVENOUS
  Filled 2019-04-26 (×30): qty 2

## 2019-04-26 NOTE — Progress Notes (Signed)
SLP Cancellation Note  Patient Details Name: Luke Choi MRN: [id number] DOB: November 25, 1970   Cancelled treatment:       Reason Eval/Treat Not Completed: Patient not medically ready, remains on vent. Will f/u as able.   Luke Choi 04/26/2019, 6:58 AM   Pollyann Glen, M.A. [facility] Acute Environmental education officer [phone] Office [phone]

## 2019-04-26 NOTE — Progress Notes (Addendum)
OT Cancellation Note  Patient Details Name: Shawne Eskelson MRN: [id number] DOB: 1971-03-26   Cancelled Treatment:    Reason Eval/Treat Not Completed: Patient not medically ready(Medical issues which prohibited therapy. Pt with new pleural effusion which RN states is planned to get tapped this PM. Will return as schedule allows.)  Lequita Halt, OT Student  04/26/2019, 1:03 PM

## 2019-04-26 NOTE — Progress Notes (Signed)
Patient ID: Luke Choi, male   DOB: 09/14/1970, 48 y.o.   MRN: [id number] R chest U/S done to attempt thoracentesis. Very small effusion, too small to drain. Consoldation RLL C/W PNA. Resp CX and start Maxipime empiric.  Georganna Skeans, MD, MPH, FACS Trauma & General Surgery: [phone]

## 2019-04-26 NOTE — Progress Notes (Signed)
PT Cancellation Note  Patient Details Name: Luke Choi MRN: [id number] DOB: 07-04-1971   Cancelled Treatment:    Reason Eval/Treat Not Completed: Medical issues which prohibited therapy Pt with new pleural effusion which RN states is planned to get tapped this PM. RN request hold for PT. Will follow.   Marguarite Arbour A Shilo Pauwels 04/26/2019, 8:21 AM  Wray Kearns, PT, DPT Acute Rehabilitation Services Pager [phone] Office [phone]
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
## 2019-04-27 ENCOUNTER — Inpatient Hospital Stay (HOSPITAL_COMMUNITY): Payer: BC Managed Care – PPO

## 2019-04-27 LAB — BASIC METABOLIC PANEL
Anion gap: 15 (ref 5–15)
BUN: 16 mg/dL (ref 6–20)
CO2: 23 mmol/L (ref 22–32)
Calcium: 8 mg/dL — ABNORMAL LOW (ref 8.9–10.3)
Chloride: 103 mmol/L (ref 98–111)
Creatinine, Ser: 0.59 mg/dL — ABNORMAL LOW (ref 0.61–1.24)
GFR calc Af Amer: >60 mL/min (ref >60)
GFR calc non Af Amer: >60 mL/min (ref >60)
Glucose, Bld: 106 mg/dL — ABNORMAL HIGH (ref 70–99)
Potassium: 4.3 mmol/L (ref 3.5–5.1)
Sodium: 141 mmol/L (ref 135–145)

## 2019-04-27 LAB — POCT I-STAT 7, (LYTES, BLD GAS, ICA,H+H)
Acid-Base Excess: 7 mmol/L — ABNORMAL HIGH (ref 0.0–2.0)
Acid-Base Excess: 6 mmol/L — ABNORMAL HIGH (ref 0.0–2.0)
Bicarbonate: 28 mmol/L (ref 20.0–28.0)
Bicarbonate: 28 mmol/L (ref 20.0–28.0)
Calcium, Ion: 1.1 mmol/L — ABNORMAL LOW (ref 1.15–1.40)
Calcium, Ion: 1.08 mmol/L — ABNORMAL LOW (ref 1.15–1.40)
HCT: 22 % — ABNORMAL LOW (ref 39.0–52.0)
HCT: 32 % — ABNORMAL LOW (ref 39.0–52.0)
Hemoglobin: 7.5 g/dL — ABNORMAL LOW (ref 13.0–17.0)
Hemoglobin: 10.9 g/dL — ABNORMAL LOW (ref 13.0–17.0)
O2 Saturation: 91 %
O2 Saturation: 89 %
Patient temperature: 100.4
Potassium: 3.7 mmol/L (ref 3.5–5.1)
Potassium: 3.8 mmol/L (ref 3.5–5.1)
Sodium: 137 mmol/L (ref 135–145)
Sodium: 141 mmol/L (ref 135–145)
TCO2: 29 mmol/L (ref 22–32)
TCO2: 29 mmol/L (ref 22–32)
pCO2 arterial: 26.9 mmHg — ABNORMAL LOW (ref 32.0–48.0)
pCO2 arterial: 33.2 mmHg (ref 32.0–48.0)
pH, Arterial: 7.626 (ref 7.350–7.450)
pH, Arterial: 7.537 — ABNORMAL HIGH (ref 7.350–7.450)
pO2, Arterial: 49 mmHg — ABNORMAL LOW (ref 83.0–108.0)
pO2, Arterial: 51 mmHg — ABNORMAL LOW (ref 83.0–108.0)

## 2019-04-27 LAB — TRIGLYCERIDES: Triglycerides: 102 mg/dL (ref <150)

## 2019-04-27 LAB — GLUCOSE, CAPILLARY
Glucose-Capillary: 101 mg/dL — ABNORMAL HIGH (ref 70–99)
Glucose-Capillary: 106 mg/dL — ABNORMAL HIGH (ref 70–99)
Glucose-Capillary: 110 mg/dL — ABNORMAL HIGH (ref 70–99)
Glucose-Capillary: 114 mg/dL — ABNORMAL HIGH (ref 70–99)
Glucose-Capillary: 114 mg/dL — ABNORMAL HIGH (ref 70–99)
Glucose-Capillary: 117 mg/dL — ABNORMAL HIGH (ref 70–99)

## 2019-04-27 LAB — CBC
HCT: 26.5 % — ABNORMAL LOW (ref 39.0–52.0)
Hemoglobin: 8.8 g/dL — ABNORMAL LOW (ref 13.0–17.0)
MCH: 31.2 pg (ref 26.0–34.0)
MCHC: 33.2 g/dL (ref 30.0–36.0)
MCV: 94 fL (ref 80.0–100.0)
Platelets: 340 10*3/uL (ref 150–400)
RBC: 2.82 MIL/uL — ABNORMAL LOW (ref 4.22–5.81)
RDW: 13.1 % (ref 11.5–15.5)
WBC: 11.1 10*3/uL — ABNORMAL HIGH (ref 4.0–10.5)
nRBC: 0.3 % — ABNORMAL HIGH (ref 0.0–0.2)

## 2019-04-27 MED ORDER — ACETAMINOPHEN 650 MG RE SUPP
650.0000 mg | Freq: Four times a day (QID) | RECTAL | Status: DC | PRN
  Administered 2019-04-27 – 2019-05-04 (×5): 650 mg via RECTAL
  Filled 2019-04-27 (×5): qty 1

## 2019-04-27 MED ORDER — IPRATROPIUM-ALBUTEROL 0.5-2.5 (3) MG/3ML IN SOLN
RESPIRATORY_TRACT | Status: AC
  Filled 2019-04-27: qty 3

## 2019-04-27 MED ORDER — HYDROMORPHONE HCL 1 MG/ML IJ SOLN
0.5000 mg | INTRAMUSCULAR | Status: DC | PRN
  Administered 2019-04-27 (×2): 1 mg via INTRAVENOUS
  Administered 2019-04-28: 0.5 mg via INTRAVENOUS
  Administered 2019-04-28 – 2019-04-29 (×6): 1 mg via INTRAVENOUS
  Filled 2019-04-27 (×9): qty 1

## 2019-04-27 MED ORDER — METHOCARBAMOL 1000 MG/10ML IJ SOLN
1000.0000 mg | Freq: Three times a day (TID) | INTRAVENOUS | Status: DC
  Administered 2019-04-27 – 2019-05-03 (×12): 1000 mg via INTRAVENOUS
  Filled 2019-04-27 (×21): qty 10

## 2019-04-27 MED ORDER — LORAZEPAM 2 MG/ML IJ SOLN
1.0000 mg | INTRAMUSCULAR | Status: DC | PRN
  Administered 2019-04-27 – 2019-04-29 (×8): 1 mg via INTRAVENOUS
  Filled 2019-04-27 (×8): qty 1

## 2019-04-27 MED ORDER — IPRATROPIUM-ALBUTEROL 0.5-2.5 (3) MG/3ML IN SOLN
3.0000 mL | RESPIRATORY_TRACT | Status: DC | PRN
  Administered 2019-04-27: 3 mL via RESPIRATORY_TRACT

## 2019-04-27 MED ORDER — KETOROLAC TROMETHAMINE 15 MG/ML IJ SOLN
15.0000 mg | Freq: Four times a day (QID) | INTRAMUSCULAR | Status: AC | PRN
  Administered 2019-04-27 – 2019-04-28 (×4): 15 mg via INTRAVENOUS
  Filled 2019-04-27 (×4): qty 1

## 2019-04-27 NOTE — Procedures (Signed)
Extubation Procedure Note  Patient Details:   Name: Luke Choi DOB: 1971/02/08 MRN: [id number]   Airway Documentation:    Vent end date: 04/27/19 Vent end time: 1035   Evaluation  O2 sats: stable throughout Complications: No apparent complications Patient did tolerate procedure well. Bilateral Breath Sounds: Clear, Diminished   Yes   Pt extubated to 3L, increased to 5L due to low spo2. Pt stable throughout with no complications. VS within normal limits. Pt able to speak and has a strong productive cough post extubation. RT will continue to monitor.   Jesse Sans 04/27/2019, 10:39 AM

## 2019-04-27 NOTE — Consult Note (Signed)
[place] Nurse wound follow up Wound type: surgical  Measurement: open area 5.0cm x 2.5cm x 0.5cm  Wound bed: white fibrinous base Drainage (amount, consistency, odor) minimal, no odor Periwound: edema, palpable distal pulses  Dressing procedure/placement/frequency: 2 pc of Mepitel used to cover closed incision, most distal portion covered with black foam. Sealed at 135mmHG with no problems. Patient on Fentanyl gtt for pain management during dressing change.   Since the majority of this wound is a closed incision and we typically change incisional NPWT weekly I will reach out to the orthopedic surgeon about the dressing change frequency.[place], [place], [place]

## 2019-04-27 NOTE — Progress Notes (Signed)
OT Cancellation Note  Patient Details Name: Layson Bertsch MRN: [id number] DOB: Sep 07, 1970   Cancelled Treatment:    Reason Eval/Treat Not Completed: Patient not medically ready(Per RN, pt is tachy with stimulation, lethargic, and may be re-intubated. Will continue to follow)  Lequita Halt, OT Student  04/27/2019, 2:38 PM
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
## 2019-04-27 NOTE — Progress Notes (Signed)
Wasted 24ml of fentanyl in the sink. Witnessed by Buck Mam, RN

## 2019-04-27 NOTE — Progress Notes (Signed)
PT Cancellation Note  Patient Details Name: Luke Choi MRN: [id number] DOB: 1970/12/02   Cancelled Treatment:    Reason Eval/Treat Not Completed: Medical issues which prohibited therapy.  Pt extubated today, but breathing is tenuous.  RN suggested holding. PT to check on pt tomorrow.  Thanks,  Barbarann Ehlers. Poetry Cerro, PT, DPT  Acute Rehabilitation [phone] pager [phone] office  @ [facility]: [phone]     Harvie Heck 04/27/2019, 2:42 PM

## 2019-04-28 ENCOUNTER — Inpatient Hospital Stay (HOSPITAL_COMMUNITY): Payer: BC Managed Care – PPO

## 2019-04-28 LAB — POCT I-STAT 7, (LYTES, BLD GAS, ICA,H+H)
Acid-Base Excess: 4 mmol/L — ABNORMAL HIGH (ref 0.0–2.0)
Acid-Base Excess: 5 mmol/L — ABNORMAL HIGH (ref 0.0–2.0)
Bicarbonate: 25.6 mmol/L (ref 20.0–28.0)
Bicarbonate: 26.5 mmol/L (ref 20.0–28.0)
Calcium, Ion: 1.11 mmol/L — ABNORMAL LOW (ref 1.15–1.40)
Calcium, Ion: 1.14 mmol/L — ABNORMAL LOW (ref 1.15–1.40)
HCT: 24 % — ABNORMAL LOW (ref 39.0–52.0)
HCT: 25 % — ABNORMAL LOW (ref 39.0–52.0)
Hemoglobin: 8.2 g/dL — ABNORMAL LOW (ref 13.0–17.0)
Hemoglobin: 8.5 g/dL — ABNORMAL LOW (ref 13.0–17.0)
O2 Saturation: 90 %
O2 Saturation: 94 %
Patient temperature: 100.7
Patient temperature: 98.6
Potassium: 3.4 mmol/L — ABNORMAL LOW (ref 3.5–5.1)
Potassium: 3.4 mmol/L — ABNORMAL LOW (ref 3.5–5.1)
Sodium: 142 mmol/L (ref 135–145)
Sodium: 142 mmol/L (ref 135–145)
TCO2: 26 mmol/L (ref 22–32)
TCO2: 27 mmol/L (ref 22–32)
pCO2 arterial: 27.4 mmHg — ABNORMAL LOW (ref 32.0–48.0)
pCO2 arterial: 30 mmHg — ABNORMAL LOW (ref 32.0–48.0)
pH, Arterial: 7.543 — ABNORMAL HIGH (ref 7.350–7.450)
pH, Arterial: 7.593 — ABNORMAL HIGH (ref 7.350–7.450)
pO2, Arterial: 47 mmHg — ABNORMAL LOW (ref 83.0–108.0)
pO2, Arterial: 65 mmHg — ABNORMAL LOW (ref 83.0–108.0)

## 2019-04-28 LAB — BASIC METABOLIC PANEL
Anion gap: 12 (ref 5–15)
BUN: 16 mg/dL (ref 6–20)
CO2: 25 mmol/L (ref 22–32)
Calcium: 8 mg/dL — ABNORMAL LOW (ref 8.9–10.3)
Chloride: 104 mmol/L (ref 98–111)
Creatinine, Ser: 0.83 mg/dL (ref 0.61–1.24)
GFR calc Af Amer: >60 mL/min (ref >60)
GFR calc non Af Amer: >60 mL/min (ref >60)
Glucose, Bld: 118 mg/dL — ABNORMAL HIGH (ref 70–99)
Potassium: 3.3 mmol/L — ABNORMAL LOW (ref 3.5–5.1)
Sodium: 141 mmol/L (ref 135–145)

## 2019-04-28 LAB — CBC
HCT: 23.7 % — ABNORMAL LOW (ref 39.0–52.0)
Hemoglobin: 8.1 g/dL — ABNORMAL LOW (ref 13.0–17.0)
MCH: 31 pg (ref 26.0–34.0)
MCHC: 34.2 g/dL (ref 30.0–36.0)
MCV: 90.8 fL (ref 80.0–100.0)
Platelets: 409 10*3/uL — ABNORMAL HIGH (ref 150–400)
RBC: 2.61 MIL/uL — ABNORMAL LOW (ref 4.22–5.81)
RDW: 13.2 % (ref 11.5–15.5)
WBC: 22.3 10*3/uL — ABNORMAL HIGH (ref 4.0–10.5)
nRBC: 0.1 % (ref 0.0–0.2)

## 2019-04-28 LAB — GLUCOSE, CAPILLARY
Glucose-Capillary: 112 mg/dL — ABNORMAL HIGH (ref 70–99)
Glucose-Capillary: 113 mg/dL — ABNORMAL HIGH (ref 70–99)
Glucose-Capillary: 113 mg/dL — ABNORMAL HIGH (ref 70–99)
Glucose-Capillary: 114 mg/dL — ABNORMAL HIGH (ref 70–99)
Glucose-Capillary: 125 mg/dL — ABNORMAL HIGH (ref 70–99)

## 2019-04-28 LAB — TRIGLYCERIDES: Triglycerides: 109 mg/dL (ref <150)

## 2019-04-28 MED ORDER — PIVOT 1.5 CAL PO LIQD: 1000.0000 mL | ORAL | Status: DC

## 2019-04-28 MED ORDER — VANCOMYCIN HCL 10 G IV SOLR
2250.0000 mg | Freq: Once | INTRAVENOUS | Status: AC
  Administered 2019-04-28: 2250 mg via INTRAVENOUS
  Filled 2019-04-28: qty 2000

## 2019-04-28 MED ORDER — IPRATROPIUM-ALBUTEROL 0.5-2.5 (3) MG/3ML IN SOLN
3.0000 mL | Freq: Four times a day (QID) | RESPIRATORY_TRACT | Status: DC
  Administered 2019-04-28 – 2019-05-16 (×74): 3 mL via RESPIRATORY_TRACT
  Filled 2019-04-28 (×73): qty 3

## 2019-04-28 MED ORDER — VANCOMYCIN HCL 10 G IV SOLR
1250.0000 mg | Freq: Two times a day (BID) | INTRAVENOUS | Status: DC
  Administered 2019-04-29 – 2019-05-01 (×5): 1250 mg via INTRAVENOUS
  Filled 2019-04-28 (×7): qty 1250

## 2019-04-28 MED ORDER — VANCOMYCIN HCL 10 G IV SOLR
1250.0000 mg | Freq: Two times a day (BID) | INTRAVENOUS | Status: DC
  Filled 2019-04-28 (×2): qty 1250

## 2019-04-28 MED ORDER — DEXMEDETOMIDINE HCL IN NACL 400 MCG/100ML IV SOLN
0.4000 ug/kg/h | INTRAVENOUS | Status: DC
  Administered 2019-04-28: 2 ug/kg/h via INTRAVENOUS
  Administered 2019-04-28: 1.7 ug/kg/h via INTRAVENOUS
  Administered 2019-04-28: 1.8 ug/kg/h via INTRAVENOUS
  Administered 2019-04-28 – 2019-04-29 (×6): 2 ug/kg/h via INTRAVENOUS
  Administered 2019-04-29: 1.6 ug/kg/h via INTRAVENOUS
  Administered 2019-04-29: 0.6 ug/kg/h via INTRAVENOUS
  Administered 2019-04-29: 1.2 ug/kg/h via INTRAVENOUS
  Administered 2019-04-29: 2 ug/kg/h via INTRAVENOUS
  Filled 2019-04-28 (×3): qty 100
  Filled 2019-04-28: qty 200
  Filled 2019-04-28 (×10): qty 100

## 2019-04-28 MED ORDER — PIVOT 1.5 CAL PO LIQD
1000.0000 mL | ORAL | Status: DC
  Filled 2019-04-28: qty 1000

## 2019-04-28 MED ORDER — POTASSIUM CHLORIDE 20 MEQ/15ML (10%) PO SOLN
40.0000 meq | Freq: Two times a day (BID) | ORAL | Status: AC
  Administered 2019-04-28: 40 meq
  Filled 2019-04-28: qty 30

## 2019-04-28 NOTE — Progress Notes (Signed)
RT obtained a repeat gas on pt with the following results and changes. NIV (BIPAP) 15/5 w/FIO2 increased to 50%. Pt does well when not agitated, agitation increases pts RR and WOB. RT will continue to monitor.   Results for Luke Choi, Luke Choi (MRN [id number]) as of 04/28/2019 06:18  Ref. Range 04/28/2019 06:11  Sample type Unknown ARTERIAL  pH, Arterial Latest Ref Range: 7.350 - 7.450  7.543 (H)  pCO2 arterial Latest Ref Range: 32.0 - 48.0 mmHg 30.0 (L)  pO2, Arterial Latest Ref Range: 83.0 - 108.0 mmHg 65.0 (L)  TCO2 Latest Ref Range: 22 - 32 mmol/L 26  Acid-Base Excess Latest Ref Range: 0.0 - 2.0 mmol/L 4.0 (H)  Bicarbonate Latest Ref Range: 20.0 - 28.0 mmol/L 25.6  O2 Saturation Latest Units: % 94.0  Patient temperature Unknown 100.7 F  Collection site Unknown RADIAL, ALLEN'S TEST ACCEPTABLE

## 2019-04-28 NOTE — Progress Notes (Signed)
Pt has increased work of breathing, RR 40, O2sats 78% NRB mask. Pt very anxious. Bagged pt and O2sats increased to 97%. Pt then placed on Bipap and O2sats 100%. RR now 29
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
## 2019-04-28 NOTE — Progress Notes (Signed)
SLP Cancellation Note  Patient Details Name: Luke Choi MRN: [id number] DOB: 02-26-1971   Cancelled treatment:       Reason Eval/Treat Not Completed: Medical issues which prohibited therapy;Patient not medically ready(Pt has been off of BiPAP for approximately 30 minutes but requires non-rebreather mask at this time due to desaturation to the 80s. Case was discussed with RN and it was agreed that speech/language/cognition evaluation be deferred at this time. SLP will follow up on subsequent date unless otherwise contacted by RN.)  Tobie Poet I. Hardin Negus, [place], [place] Office number [phone] Pager [place] 04/28/2019, 8:50 AM

## 2019-04-28 NOTE — Plan of Care (Signed)
  Problem: Clinical Measurements: Goal: Ability to maintain clinical measurements within normal limits will improve Outcome: Progressing   
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
## 2019-04-28 NOTE — Progress Notes (Signed)
Pharmacy Antibiotic Note  Luke Choi is a 48 y.o. male admitted on 04/20/2019 with pneumonia. Patient is currently on day #3 of cefepime. Pharmacy has now been consulted for Vancomycin dosing due to worsening clinical progression.  Patient's fever trend continues to rise with a Tmax of 102.6. WBC sharply increased to 22.3 today from 11.1. Renal function has been stable (Scr 0.83) with UOP of 3.3L yesterday. CXR from yesterday showed bilateral infiltrates, repeat CXR ordered for tomorrow.  Plan: Give Vancomycin 2250 mg IV once, then 1250 mg IV Q 12 hrs.  - Goal AUC 400-550. - Expected AUC: 517 - SCr used: 0.83 (Vd 0.5) Continue Cefepime 2g Q8h Monitor cultures and sensitivities, renal function, and clinical progression Consider vancomycin levels at steady state  Height: 5\' 10"  (177.8 cm) Weight: 211 lb 6.7 oz (95.9 kg) IBW/kg (Calculated) : 73  Temp (24hrs), Avg:100.5 F (38.1 C), Min:98.6 F (37 C), Max:102.6 F (39.2 C)  Recent Labs  Lab 04/23/19 0402 04/24/19 0530 04/25/19 0231 04/26/19 0406 04/27/19 0456 04/28/19 0555  WBC 11.8* 11.1* 11.4* 11.6* 11.1* 22.3*  CREATININE 0.81  --  0.72 0.72 0.59* 0.83    Estimated Creatinine Clearance: 130.7 mL/min (by C-G formula based on SCr of 0.83 mg/dL).    Allergies  Allergen Reactions  . Bee Venom Anaphylaxis    Antimicrobials this admission: Vancomycin 9/17 >>  Cefepime 9/15 >>  Cefazolin x1 periop 9/11  Dose adjustments this admission: None  Microbiology results: 9/15 Sputum: GNR, GPC  9/9 MRSA PCR: neg 9/9 COVID: neg  Richardine Service, PharmD PGY1 Pharmacy Resident Phone: [phone] 04/28/2019  3:53 PM  Please check [email or web address] for unit-specific pharmacy phone numbers.
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
## 2019-04-28 NOTE — Progress Notes (Signed)
RT called to pt room for desaturation into low 80's. Pt was placed on NRB 15Lpm. Pt sats still dipping and unstable. RT obtained a blood gas with the following results and changes in treatment. Pt was placed on Servo I NIV BIPAP for increased WOB 10/5 and 40% FIO2. RT will continue to monitor.   Results for Luke Choi, Luke Choi (MRN [id number]) as of 04/28/2019 00:47  Ref. Range 04/28/2019 00:32  Sample type Unknown ARTERIAL  pH, Arterial Latest Ref Range: 7.350 - 7.450  7.593 (H)  pCO2 arterial Latest Ref Range: 32.0 - 48.0 mmHg 27.4 (L)  pO2, Arterial Latest Ref Range: 83.0 - 108.0 mmHg 47.0 (L)  TCO2 Latest Ref Range: 22 - 32 mmol/L 27  Acid-Base Excess Latest Ref Range: 0.0 - 2.0 mmol/L 5.0 (H)  Bicarbonate Latest Ref Range: 20.0 - 28.0 mmol/L 26.5  O2 Saturation Latest Units: % 90.0  Patient temperature Unknown 98.6 F  Collection site Unknown RADIAL, ALLEN'S TEST ACCEPTABLE

## 2019-04-29 ENCOUNTER — Inpatient Hospital Stay (HOSPITAL_COMMUNITY): Payer: BC Managed Care – PPO

## 2019-04-29 ENCOUNTER — Inpatient Hospital Stay: Payer: Self-pay

## 2019-04-29 ENCOUNTER — Inpatient Hospital Stay (HOSPITAL_COMMUNITY): Payer: BC Managed Care – PPO | Admitting: Certified Registered"

## 2019-04-29 LAB — BASIC METABOLIC PANEL
Anion gap: 12 (ref 5–15)
BUN: 25 mg/dL — ABNORMAL HIGH (ref 6–20)
CO2: 20 mmol/L — ABNORMAL LOW (ref 22–32)
Calcium: 8 mg/dL — ABNORMAL LOW (ref 8.9–10.3)
Chloride: 112 mmol/L — ABNORMAL HIGH (ref 98–111)
Creatinine, Ser: 0.8 mg/dL (ref 0.61–1.24)
GFR calc Af Amer: >60 mL/min (ref >60)
GFR calc non Af Amer: >60 mL/min (ref >60)
Glucose, Bld: 118 mg/dL — ABNORMAL HIGH (ref 70–99)
Potassium: 4.3 mmol/L (ref 3.5–5.1)
Sodium: 144 mmol/L (ref 135–145)

## 2019-04-29 LAB — GLUCOSE, CAPILLARY
Glucose-Capillary: 117 mg/dL — ABNORMAL HIGH (ref 70–99)
Glucose-Capillary: 121 mg/dL — ABNORMAL HIGH (ref 70–99)
Glucose-Capillary: 122 mg/dL — ABNORMAL HIGH (ref 70–99)
Glucose-Capillary: 123 mg/dL — ABNORMAL HIGH (ref 70–99)
Glucose-Capillary: 128 mg/dL — ABNORMAL HIGH (ref 70–99)

## 2019-04-29 LAB — BLOOD GAS, ARTERIAL
Acid-base deficit: 1.6 mmol/L (ref 0.0–2.0)
Bicarbonate: 26 mmol/L (ref 20.0–28.0)
Drawn by: 511851
FIO2: 100
MECHVT: 580 mL
O2 Saturation: 97.4 %
PEEP: 10 cmH2O
Patient temperature: 101.1
RATE: 15 resp/min
pCO2 arterial: 79.4 mmHg (ref 32.0–48.0)
pH, Arterial: 7.152 — CL (ref 7.350–7.450)
pO2, Arterial: 136 mmHg — ABNORMAL HIGH (ref 83.0–108.0)

## 2019-04-29 LAB — CBC
HCT: 24.8 % — ABNORMAL LOW (ref 39.0–52.0)
Hemoglobin: 7.9 g/dL — ABNORMAL LOW (ref 13.0–17.0)
MCH: 29.9 pg (ref 26.0–34.0)
MCHC: 31.9 g/dL (ref 30.0–36.0)
MCV: 93.9 fL (ref 80.0–100.0)
Platelets: 471 10*3/uL — ABNORMAL HIGH (ref 150–400)
RBC: 2.64 MIL/uL — ABNORMAL LOW (ref 4.22–5.81)
RDW: 13.6 % (ref 11.5–15.5)
WBC: 27.6 10*3/uL — ABNORMAL HIGH (ref 4.0–10.5)
nRBC: 0 % (ref 0.0–0.2)

## 2019-04-29 LAB — POCT I-STAT 7, (LYTES, BLD GAS, ICA,H+H)
Bicarbonate: 25.1 mmol/L (ref 20.0–28.0)
Calcium, Ion: 1.17 mmol/L (ref 1.15–1.40)
HCT: 19 % — ABNORMAL LOW (ref 39.0–52.0)
Hemoglobin: 6.5 g/dL — CL (ref 13.0–17.0)
O2 Saturation: 100 %
Patient temperature: 99.6
Potassium: 3.8 mmol/L (ref 3.5–5.1)
Sodium: 146 mmol/L — ABNORMAL HIGH (ref 135–145)
TCO2: 26 mmol/L (ref 22–32)
pCO2 arterial: 46.3 mmHg (ref 32.0–48.0)
pH, Arterial: 7.345 — ABNORMAL LOW (ref 7.350–7.450)
pO2, Arterial: 246 mmHg — ABNORMAL HIGH (ref 83.0–108.0)

## 2019-04-29 LAB — CULTURE, RESPIRATORY W GRAM STAIN: Special Requests: NORMAL

## 2019-04-29 LAB — TRIGLYCERIDES: Triglycerides: 150 mg/dL — ABNORMAL HIGH (ref <150)

## 2019-04-29 MED ORDER — VECURONIUM BROMIDE 10 MG IV SOLR
10.0000 mg | Freq: Once | INTRAVENOUS | Status: AC
  Administered 2019-04-29: 10 mg via INTRAVENOUS

## 2019-04-29 MED ORDER — PHENYLEPHRINE HCL-NACL 10-0.9 MG/250ML-% IV SOLN
0.0000 ug/min | INTRAVENOUS | Status: DC
  Administered 2019-04-29: 40 ug/min via INTRAVENOUS
  Administered 2019-04-29: 60 ug/min via INTRAVENOUS
  Administered 2019-04-30: 30 ug/min via INTRAVENOUS
  Filled 2019-04-29 (×3): qty 250

## 2019-04-29 MED ORDER — VECURONIUM BROMIDE 10 MG IV SOLR
INTRAVENOUS | Status: AC
  Filled 2019-04-29: qty 10

## 2019-04-29 MED ORDER — FENTANYL CITRATE (PF) 100 MCG/2ML IJ SOLN
50.0000 ug | INTRAMUSCULAR | Status: DC | PRN
  Administered 2019-05-12: 50 ug via INTRAVENOUS
  Administered 2019-05-21: 100 ug via INTRAVENOUS
  Filled 2019-04-29: qty 2

## 2019-04-29 MED ORDER — LIDOCAINE HCL (CARDIAC) PF 100 MG/5ML IV SOSY
PREFILLED_SYRINGE | INTRAVENOUS | Status: DC | PRN
  Administered 2019-04-29: 50 mg via INTRAVENOUS

## 2019-04-29 MED ORDER — SODIUM CHLORIDE 0.9 % IV SOLN
INTRAVENOUS | Status: DC | PRN
  Administered 2019-04-29: 500 mL via INTRAVENOUS
  Administered 2019-04-30: 250 mL via INTRAVENOUS
  Administered 2019-05-07: 500 mL via INTRAVENOUS
  Administered 2019-05-11: 12:00:00 1000 mL via INTRAVENOUS
  Administered 2019-05-18 – 2019-05-19 (×2): 500 mL via INTRAVENOUS

## 2019-04-29 MED ORDER — SUCCINYLCHOLINE CHLORIDE 20 MG/ML IJ SOLN
INTRAMUSCULAR | Status: DC | PRN
  Administered 2019-04-29: 100 mg via INTRAVENOUS

## 2019-04-29 MED ORDER — VECURONIUM BROMIDE 10 MG IV SOLR
0.0000 ug/kg/min | INTRAVENOUS | Status: DC
  Administered 2019-04-29 – 2019-05-01 (×3): 1 ug/kg/min via INTRAVENOUS
  Administered 2019-05-01: 1.2 ug/kg/min via INTRAVENOUS
  Administered 2019-05-02: 1.3 ug/kg/min via INTRAVENOUS
  Filled 2019-04-29 (×2): qty 100
  Filled 2019-04-29: qty 30
  Filled 2019-04-29 (×4): qty 100

## 2019-04-29 MED ORDER — FENTANYL CITRATE (PF) 100 MCG/2ML IJ SOLN
100.0000 ug | Freq: Once | INTRAMUSCULAR | Status: AC
  Administered 2019-04-29: 100 ug via INTRAVENOUS

## 2019-04-29 MED ORDER — PRO-STAT SUGAR FREE PO LIQD
60.0000 mL | Freq: Two times a day (BID) | ORAL | Status: DC
  Administered 2019-04-29 – 2019-05-16 (×33): 60 mL
  Filled 2019-04-29 (×34): qty 60

## 2019-04-29 MED ORDER — ALBUMIN HUMAN 5 % IV SOLN
25.0000 g | Freq: Once | INTRAVENOUS | Status: AC
  Administered 2019-04-29: 17:00:00 25 g via INTRAVENOUS
  Filled 2019-04-29: qty 500

## 2019-04-29 MED ORDER — MIDAZOLAM 50MG/50ML (1MG/ML) PREMIX INFUSION
0.0000 mg/h | INTRAVENOUS | Status: DC
  Administered 2019-04-29: 2 mg/h via INTRAVENOUS
  Administered 2019-04-30: 3 mg/h via INTRAVENOUS
  Administered 2019-04-30: 4 mg/h via INTRAVENOUS
  Administered 2019-05-02 – 2019-05-04 (×6): 5 mg/h via INTRAVENOUS
  Administered 2019-05-05: 4 mg/h via INTRAVENOUS
  Administered 2019-05-05: 2 mg/h via INTRAVENOUS
  Administered 2019-05-06 – 2019-05-07 (×4): 4 mg/h via INTRAVENOUS
  Administered 2019-05-08: 01:00:00 5 mg/h via INTRAVENOUS
  Administered 2019-05-08 – 2019-05-10 (×6): 6 mg/h via INTRAVENOUS
  Administered 2019-05-10: 7 mg/h via INTRAVENOUS
  Administered 2019-05-10: 13:00:00 6 mg/h via INTRAVENOUS
  Administered 2019-05-10: 7 mg/h via INTRAVENOUS
  Administered 2019-05-11: 21:00:00 6 mg/h via INTRAVENOUS
  Administered 2019-05-11 – 2019-05-12 (×2): 5 mg/h via INTRAVENOUS
  Administered 2019-05-14: 12:00:00 4 mg/h via INTRAVENOUS
  Administered 2019-05-14: 3 mg/h via INTRAVENOUS
  Administered 2019-05-15: 6 mg/h via INTRAVENOUS
  Administered 2019-05-15: 22:00:00 7 mg/h via INTRAVENOUS
  Administered 2019-05-15: 6 mg/h via INTRAVENOUS
  Administered 2019-05-15: 01:00:00 5 mg/h via INTRAVENOUS
  Administered 2019-05-16: 10:00:00 4 mg/h via INTRAVENOUS
  Administered 2019-05-16: 05:00:00 7 mg/h via INTRAVENOUS
  Administered 2019-05-17 (×2): 3 mg/h via INTRAVENOUS
  Administered 2019-05-18: 6 mg/h via INTRAVENOUS
  Filled 2019-04-29 (×42): qty 50

## 2019-04-29 MED ORDER — ARTIFICIAL TEARS OPHTHALMIC OINT
1.0000 "application " | TOPICAL_OINTMENT | Freq: Three times a day (TID) | OPHTHALMIC | Status: DC
  Administered 2019-04-29 – 2019-05-03 (×12): 1 via OPHTHALMIC
  Filled 2019-04-29 (×2): qty 3.5

## 2019-04-29 MED ORDER — PROPOFOL 10 MG/ML IV BOLUS
INTRAVENOUS | Status: DC | PRN
  Administered 2019-04-29: 150 mg via INTRAVENOUS
  Administered 2019-04-29: 20 mg via INTRAVENOUS

## 2019-04-29 MED ORDER — PROPOFOL 1000 MG/100ML IV EMUL
0.0000 ug/kg/min | INTRAVENOUS | Status: DC
  Administered 2019-04-29 (×4): 50 ug/kg/min via INTRAVENOUS
  Administered 2019-04-29: 40 ug/kg/min via INTRAVENOUS
  Administered 2019-04-30: 50 ug/kg/min via INTRAVENOUS
  Administered 2019-04-30: 23:00:00 40 ug/kg/min via INTRAVENOUS
  Administered 2019-04-30 – 2019-05-01 (×3): 50 ug/kg/min via INTRAVENOUS
  Administered 2019-05-01: 40 ug/kg/min via INTRAVENOUS
  Administered 2019-05-01 – 2019-05-03 (×9): 50 ug/kg/min via INTRAVENOUS
  Administered 2019-05-03: 40 ug/kg/min via INTRAVENOUS
  Administered 2019-05-03 – 2019-05-04 (×5): 50 ug/kg/min via INTRAVENOUS
  Administered 2019-05-04: 40 ug/kg/min via INTRAVENOUS
  Administered 2019-05-04 – 2019-05-05 (×4): 50 ug/kg/min via INTRAVENOUS
  Filled 2019-04-29 (×3): qty 100
  Filled 2019-04-29: qty 200
  Filled 2019-04-29 (×3): qty 100
  Filled 2019-04-29: qty 200
  Filled 2019-04-29 (×6): qty 100
  Filled 2019-04-29: qty 200
  Filled 2019-04-29 (×10): qty 100
  Filled 2019-04-29: qty 200
  Filled 2019-04-29 (×3): qty 100
  Filled 2019-04-29: qty 200
  Filled 2019-04-29 (×3): qty 100

## 2019-04-29 MED ORDER — PHENYLEPHRINE 40 MCG/ML (10ML) SYRINGE FOR IV PUSH (FOR BLOOD PRESSURE SUPPORT)
PREFILLED_SYRINGE | INTRAVENOUS | Status: AC
  Administered 2019-04-29: 80 ug via INTRAVENOUS
  Filled 2019-04-29: qty 10

## 2019-04-29 MED ORDER — VECURONIUM BROMIDE 10 MG IV SOLR
10.0000 mg | Freq: Once | INTRAVENOUS | Status: AC
  Administered 2019-04-29: 14:00:00 10 mg via INTRAVENOUS

## 2019-04-29 MED ORDER — PIVOT 1.5 CAL PO LIQD
1000.0000 mL | ORAL | Status: DC
  Administered 2019-04-29 – 2019-05-15 (×10): 1000 mL
  Filled 2019-04-29 (×3): qty 1000

## 2019-04-29 MED ORDER — VECURONIUM BOLUS VIA INFUSION
10.0000 mg | Freq: Once | INTRAVENOUS | Status: DC
  Filled 2019-04-29: qty 10

## 2019-04-29 MED ORDER — PHENYLEPHRINE 40 MCG/ML (10ML) SYRINGE FOR IV PUSH (FOR BLOOD PRESSURE SUPPORT)
80.0000 ug | PREFILLED_SYRINGE | Freq: Once | INTRAVENOUS | Status: AC
  Administered 2019-04-29: 17:00:00 80 ug via INTRAVENOUS

## 2019-04-29 MED ORDER — FENTANYL CITRATE (PF) 100 MCG/2ML IJ SOLN: 50.0000 ug | INTRAMUSCULAR | Status: DC | PRN

## 2019-04-29 MED ORDER — METOPROLOL TARTRATE 25 MG/10 ML ORAL SUSPENSION
25.0000 mg | Freq: Two times a day (BID) | ORAL | Status: DC
  Administered 2019-04-30 – 2019-05-09 (×20): 25 mg
  Filled 2019-04-29 (×21): qty 10

## 2019-04-29 MED ORDER — FENTANYL CITRATE (PF) 100 MCG/2ML IJ SOLN
INTRAMUSCULAR | Status: AC
  Filled 2019-04-29: qty 2

## 2019-04-29 MED ORDER — MIDAZOLAM BOLUS VIA INFUSION
1.0000 mg | INTRAVENOUS | Status: DC | PRN
  Filled 2019-04-29: qty 2
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
## 2019-04-29 NOTE — Progress Notes (Signed)
SLP Cancellation Note  Patient Details Name: Luke Choi MRN: [id number] DOB: 04/05/1971   Cancelled treatment:       Reason Eval/Treat Not Completed: Medical issues which prohibited therapy;Patient not medically ready. SLP has been following pt since order was placed on 04/20/19. Pt was intubated at that time and was extubated on 04/27/19. However, evaluation has been deferred due to pt requiring 02 support via non-rebreather mask and now requiring biPAP. His case was discussed with RN and RT and it was agreed that SLP will be sign off at this time. Please re-consult when clinically indicated for speech/language/cognition evaluation and/or swallowing.  Kallan Bischoff I. Hardin Negus, [place], [place] Office number [phone] Pager [place] 04/29/2019, 9:07 AM

## 2019-04-29 NOTE — Progress Notes (Signed)
Patient ID: Luke Choi, male   DOB: 1971-03-02, 48 y.o.   MRN: [id number] Ongoing ventilator dyssynchrony despite max sedation. Start vecuronium drip.  Georganna Skeans, MD, MPH, FACS Trauma & General Surgery: [phone]

## 2019-04-29 NOTE — Progress Notes (Signed)
Patient ID: Luke Choi, male   DOB: [birth year], 48 y.o.   MRN: [id number] ABG shows hypoventilation. Increase MV - I D/W RT.  Georganna Skeans, MD, MPH, FACS Trauma & General Surgery: [phone]

## 2019-04-29 NOTE — Progress Notes (Signed)
precedex stopped 2nd to Heart rate dropping into the 30's. Trauma notified.

## 2019-04-29 NOTE — Consult Note (Signed)
[place] Nurse wound follow up Patient receiving care in [facility] 4N30. Patient was premedicated prior to LLE VAC dressing change.   Wound type: Surgical Measurement: deferred Wound bed: open wound at the ankle area is white, shallow, no odor, no drainage. Drainage (amount, consistency, odor) no odor Periwound: intact with sutures along calf Dressing procedure/placement/frequency: one piece of black foam removed from wound bed. One piece placed. Incision line protected with Mepitel along the entire length. Immediate seal obtained. Val Riles, RN, MSN, CWOCN, CNS-BC, pager [phone]

## 2019-04-29 NOTE — Progress Notes (Signed)
RT NOTE: RT advanced ETT 3cm to 26 at the lips. Vitals are stable. RT will continue to monitor.

## 2019-04-29 NOTE — Transfer of Care (Signed)
Immediate Anesthesia Transfer of Care Note  Patient: Luke Choi  Procedure(s) Performed: AN AD [place]  Patient Location: SICU  Anesthesia Type:General  Level of Consciousness: Patient remains intubated per anesthesia plan  Airway & Oxygen Therapy: Patient remains intubated per anesthesia plan  Post-op Assessment: Post -op Vital signs reviewed and stable  Post vital signs: stable  Last Vitals:  Vitals Value Taken Time  BP 156/94 04/29/19 0944  Temp    Pulse 124 04/29/19 0946  Resp 22 04/29/19 0946  SpO2 98 % 04/29/19 0946  Vitals shown include unvalidated device data.  Last Pain:  Vitals:   04/29/19 0400  TempSrc: Axillary  PainSc:          Complications: No apparent anesthesia complications

## 2019-04-29 NOTE — Procedures (Signed)
Cortrak  Person Inserting Tube:  Fotini Lemus, RD Tube Type:  Cortrak - 43 inches Tube Location:  Left nare Initial Placement:  Stomach Secured by: Bridle Technique Used to Measure Tube Placement:  Documented cm marking at nare/ corner of mouth Cortrak Secured At:  70 cm   No x-ray is required. RN may begin using tube.   If the tube becomes dislodged please keep the tube and contact the Cortrak team at [email or web address] (password [email or web address]) for replacement.  If after hours and replacement cannot be delayed, place a NG tube and confirm placement with an abdominal x-ray.    Reizel Calzada RD, LDN Clinical Nutrition Pager # - [phone]    
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
## 2019-04-29 NOTE — Procedures (Signed)
Central Venous Catheter Insertion Procedure Note Darrol Brandenburg [id number] July 30, 1971  Procedure: Insertion of Central Venous Catheter Indications: Drug and/or fluid administration  Procedure Details Consent: Risks of procedure as well as the alternatives and risks of each were explained to the (patient/caregiver).  Consent for procedure obtained. Time Out: Verified patient identification, verified procedure, site/side was marked, verified correct patient position, special equipment/implants available, medications/allergies/relevent history reviewed, required imaging and test results available.  Performed  Maximum sterile technique was used including antiseptics, cap, gloves, gown, hand hygiene and sheet. Skin prep: Chlorhexidine; local anesthetic administered A antimicrobial bonded/coated triple lumen catheter was placed in the left subclavian vein using the Seldinger technique.  Evaluation Blood flow good Complications: No apparent complications Patient did tolerate procedure well. Chest X-ray ordered to verify placement.  CXR: pending.  Zenovia Jarred 04/29/2019, 11:35 AM

## 2019-04-30 LAB — BASIC METABOLIC PANEL
Anion gap: 11 (ref 5–15)
BUN: 27 mg/dL — ABNORMAL HIGH (ref 6–20)
CO2: 25 mmol/L (ref 22–32)
Calcium: 8.2 mg/dL — ABNORMAL LOW (ref 8.9–10.3)
Chloride: 109 mmol/L (ref 98–111)
Creatinine, Ser: 0.63 mg/dL (ref 0.61–1.24)
GFR calc Af Amer: >60 mL/min (ref >60)
GFR calc non Af Amer: >60 mL/min (ref >60)
Glucose, Bld: 184 mg/dL — ABNORMAL HIGH (ref 70–99)
Potassium: 3.7 mmol/L (ref 3.5–5.1)
Sodium: 145 mmol/L (ref 135–145)

## 2019-04-30 LAB — GLUCOSE, CAPILLARY
Glucose-Capillary: 134 mg/dL — ABNORMAL HIGH (ref 70–99)
Glucose-Capillary: 154 mg/dL — ABNORMAL HIGH (ref 70–99)
Glucose-Capillary: 136 mg/dL — ABNORMAL HIGH (ref 70–99)
Glucose-Capillary: 127 mg/dL — ABNORMAL HIGH (ref 70–99)
Glucose-Capillary: 144 mg/dL — ABNORMAL HIGH (ref 70–99)
Glucose-Capillary: 135 mg/dL — ABNORMAL HIGH (ref 70–99)

## 2019-04-30 LAB — CBC
HCT: 22.5 % — ABNORMAL LOW (ref 39.0–52.0)
Hemoglobin: 7.3 g/dL — ABNORMAL LOW (ref 13.0–17.0)
MCH: 31.7 pg (ref 26.0–34.0)
MCHC: 32.4 g/dL (ref 30.0–36.0)
MCV: 97.8 fL (ref 80.0–100.0)
Platelets: 514 10*3/uL — ABNORMAL HIGH (ref 150–400)
RBC: 2.3 MIL/uL — ABNORMAL LOW (ref 4.22–5.81)
RDW: 14.1 % (ref 11.5–15.5)
WBC: 23.4 10*3/uL — ABNORMAL HIGH (ref 4.0–10.5)
nRBC: 0.1 % (ref 0.0–0.2)

## 2019-04-30 LAB — TRIGLYCERIDES: Triglycerides: 236 mg/dL — ABNORMAL HIGH (ref <150)

## 2019-04-30 NOTE — Progress Notes (Signed)
Pt not compliant with ventilator. Per Dr Grandville Silos will paralyze pt. Pt BIS of 40 achieved on precedex, fentanyl, propofol. 4 twitches at 25 amps on TOF. Pressure requiring neo stick (per Dr Grandville Silos), albumin bolus, and neo infusion. Vec initiated and 2/4 twitches achieved at 1 mcg. Temp foley inserted per protocol/order. Assisted by [facility] with above interventions. Will continue to monitor pt.  100 mL of Fentanyl wasted with Alyce Pagan.
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
## 2019-05-01 ENCOUNTER — Inpatient Hospital Stay (HOSPITAL_COMMUNITY): Payer: BC Managed Care – PPO

## 2019-05-01 LAB — GLUCOSE, CAPILLARY
Glucose-Capillary: 123 mg/dL — ABNORMAL HIGH (ref 70–99)
Glucose-Capillary: 156 mg/dL — ABNORMAL HIGH (ref 70–99)
Glucose-Capillary: 131 mg/dL — ABNORMAL HIGH (ref 70–99)
Glucose-Capillary: 137 mg/dL — ABNORMAL HIGH (ref 70–99)
Glucose-Capillary: 115 mg/dL — ABNORMAL HIGH (ref 70–99)

## 2019-05-01 LAB — TRIGLYCERIDES: Triglycerides: 171 mg/dL — ABNORMAL HIGH (ref <150)

## 2019-05-01 MED ORDER — VANCOMYCIN HCL 10 G IV SOLR
2000.0000 mg | Freq: Two times a day (BID) | INTRAVENOUS | Status: DC
  Administered 2019-05-01 – 2019-05-05 (×8): 2000 mg via INTRAVENOUS
  Filled 2019-05-01 (×9): qty 2000
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
## 2019-05-01 NOTE — Progress Notes (Signed)
Pharmacy Antibiotic Note  Luke Choi is a 48 y.o. male admitted on 04/20/2019 with pneumonia. Patient is currently on day #3 of cefepime. Pharmacy has now been consulted for Vancomycin dosing due to worsening clinical progression.  Patient is febrile again today with Tm 101.1. WBC remains elevated but trending down at 23.4. Renal function remains stable. Will increase vancomycin dose today as patient has lost 16 lb in the past 5 days. Will continue to re-address antibiotic LOT with clinical progression.  Plan: Increase Vancomycin to 2000 mg IV Q12 hrs - Goal AUC 400-550 - Expected AUC: 475 - Scr used: 0.63 (Vd 0.72) Continue Cefepime 2g Q8h Monitor cultures and sensitivities, LOT, renal function, and clinical progression Consider vancomycin levels at steady state  Height: 5\' 10"  (177.8 cm) Weight: 196 lb 3.4 oz (89 kg) IBW/kg (Calculated) : 73  Temp (24hrs), Avg:100.6 F (38.1 C), Min:99.3 F (37.4 C), Max:101.1 F (38.4 C)  Recent Labs  Lab 04/26/19 0406 04/27/19 0456 04/28/19 0555 04/29/19 0615 04/30/19 0503  WBC 11.6* 11.1* 22.3* 27.6* 23.4*  CREATININE 0.72 0.59* 0.83 0.80 0.63    Estimated Creatinine Clearance: 131 mL/min (by C-G formula based on SCr of 0.63 mg/dL).    Allergies  Allergen Reactions  . Bee Venom Anaphylaxis    Antimicrobials this admission: Vancomycin 9/17 >>  Cefepime 9/15 >>  Cefazolin x1 periop 9/11  Dose adjustments this admission: Vancomycin 1250 mg Q12h to 2000 mg Q12 h with significant weight loss  Microbiology results: 9/18 Sputum: GPC 9/15 Sputum: H influenzae beta-lactam neg 9/9 MRSA PCR: neg 9/9 COVID: neg  Richardine Service, PharmD PGY1 Pharmacy Resident Phone: [phone] 05/01/2019  10:00 AM  Please check [email or web address] for unit-specific pharmacy phone numbers.

## 2019-05-02 ENCOUNTER — Inpatient Hospital Stay (HOSPITAL_COMMUNITY): Payer: BC Managed Care – PPO

## 2019-05-02 LAB — POCT I-STAT 7, (LYTES, BLD GAS, ICA,H+H)
Acid-Base Excess: 10 mmol/L — ABNORMAL HIGH (ref 0.0–2.0)
Acid-Base Excess: 11 mmol/L — ABNORMAL HIGH (ref 0.0–2.0)
Bicarbonate: 37.4 mmol/L — ABNORMAL HIGH (ref 20.0–28.0)
Bicarbonate: 36.3 mmol/L — ABNORMAL HIGH (ref 20.0–28.0)
Calcium, Ion: 1.22 mmol/L (ref 1.15–1.40)
Calcium, Ion: 1.19 mmol/L (ref 1.15–1.40)
HCT: 23 % — ABNORMAL LOW (ref 39.0–52.0)
HCT: 21 % — ABNORMAL LOW (ref 39.0–52.0)
Hemoglobin: 7.8 g/dL — ABNORMAL LOW (ref 13.0–17.0)
Hemoglobin: 7.1 g/dL — ABNORMAL LOW (ref 13.0–17.0)
O2 Saturation: 92 %
O2 Saturation: 89 %
Patient temperature: 38
Patient temperature: 38.8
Potassium: 4.4 mmol/L (ref 3.5–5.1)
Potassium: 4 mmol/L (ref 3.5–5.1)
Sodium: 151 mmol/L — ABNORMAL HIGH (ref 135–145)
Sodium: 151 mmol/L — ABNORMAL HIGH (ref 135–145)
TCO2: 40 mmol/L — ABNORMAL HIGH (ref 22–32)
TCO2: 38 mmol/L — ABNORMAL HIGH (ref 22–32)
pCO2 arterial: 76.5 mmHg (ref 32.0–48.0)
pCO2 arterial: 57.3 mmHg — ABNORMAL HIGH (ref 32.0–48.0)
pH, Arterial: 7.302 — ABNORMAL LOW (ref 7.350–7.450)
pH, Arterial: 7.417 (ref 7.350–7.450)
pO2, Arterial: 76 mmHg — ABNORMAL LOW (ref 83.0–108.0)
pO2, Arterial: 63 mmHg — ABNORMAL LOW (ref 83.0–108.0)

## 2019-05-02 LAB — CBC
HCT: 26.4 % — ABNORMAL LOW (ref 39.0–52.0)
Hemoglobin: 8 g/dL — ABNORMAL LOW (ref 13.0–17.0)
MCH: 30.4 pg (ref 26.0–34.0)
MCHC: 30.3 g/dL (ref 30.0–36.0)
MCV: 100.4 fL — ABNORMAL HIGH (ref 80.0–100.0)
Platelets: 334 10*3/uL (ref 150–400)
RBC: 2.63 MIL/uL — ABNORMAL LOW (ref 4.22–5.81)
RDW: 14.7 % (ref 11.5–15.5)
WBC: 23.1 10*3/uL — ABNORMAL HIGH (ref 4.0–10.5)
nRBC: 0.1 % (ref 0.0–0.2)

## 2019-05-02 LAB — GLUCOSE, CAPILLARY
Glucose-Capillary: 116 mg/dL — ABNORMAL HIGH (ref 70–99)
Glucose-Capillary: 125 mg/dL — ABNORMAL HIGH (ref 70–99)
Glucose-Capillary: 131 mg/dL — ABNORMAL HIGH (ref 70–99)
Glucose-Capillary: 131 mg/dL — ABNORMAL HIGH (ref 70–99)
Glucose-Capillary: 133 mg/dL — ABNORMAL HIGH (ref 70–99)
Glucose-Capillary: 140 mg/dL — ABNORMAL HIGH (ref 70–99)

## 2019-05-02 LAB — BASIC METABOLIC PANEL
Anion gap: 7 (ref 5–15)
BUN: 19 mg/dL (ref 6–20)
CO2: 34 mmol/L — ABNORMAL HIGH (ref 22–32)
Calcium: 8.1 mg/dL — ABNORMAL LOW (ref 8.9–10.3)
Chloride: 110 mmol/L (ref 98–111)
Creatinine, Ser: 0.48 mg/dL — ABNORMAL LOW (ref 0.61–1.24)
GFR calc Af Amer: >60 mL/min (ref >60)
GFR calc non Af Amer: >60 mL/min (ref >60)
Glucose, Bld: 146 mg/dL — ABNORMAL HIGH (ref 70–99)
Potassium: 4.8 mmol/L (ref 3.5–5.1)
Sodium: 151 mmol/L — ABNORMAL HIGH (ref 135–145)

## 2019-05-02 LAB — TRIGLYCERIDES: Triglycerides: 113 mg/dL (ref <150)
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
## 2019-05-02 NOTE — Consult Note (Signed)
[place] Nurse wound follow up Patient receiving care in [facility] 4N30.  Patient remains intubated, sedated. Wound type: LLE surgical wound Measurement: The distal open wound measures 5.5 cm x 5 cm x 0.5 cm.  There is undermining from 9 - 11 o'clock of 1.8 cm and at 4 o'clock of 4 cm.  I cannot get a piece of foam under the separated dermis to adequately pack these areas and provide NPWT. Wound bed: pearly whiteish Drainage (amount, consistency, odor) serosanginous Periwound: intact I cut a vaseline gauze into two strips.  One strip I packed into the undermining from 9 - 11 and the other into the undermining at 4.  The wound bed was covered with the "tails".  All was secured with kerlex.  I contact Dr. Erlinda Hong at [phone].  He was in the OR, but the information and my actions of VAC d/c and dressing placement was communicated to Ester and she is to communicate this to Dr. Erlinda Hong.  I have requested him to come eval the wound decide on wound treatment. Val Riles, RN, MSN, CWOCN, CNS-BC, pager [phone]

## 2019-05-02 NOTE — Progress Notes (Signed)
PT Cancellation Note  Patient Details Name: Luke Choi MRN: [id number] DOB: 01-21-1971   Cancelled Treatment:    Reason Eval/Treat Not Completed: Medical issues which prohibited therapy; patient back on vent and sedated.  Will attempt again another day.   Reginia Naas 05/02/2019, 9:16 AM  Magda Kiel, [place] [phone] 05/02/2019

## 2019-05-02 NOTE — Progress Notes (Signed)
   Subjective:  Patient still intubated.  Objective:   VITALS:   Vitals:   05/02/19 0800 05/02/19 0900 05/02/19 0906 05/02/19 1000  BP: 137/68 (!) 152/68 (!) 152/68 (!) 159/73  Pulse: 73 83 84 86  Resp: 15 15 15 17   Temp: 100 F (37.8 C) 100 F (37.8 C)  100.2 F (37.9 C)  TempSrc: Bladder     SpO2: 98% 99% 97% 92%  Weight:      Height:       Laceration closure is c/d/i Open wound has no granulation tissue and undermining.  Lab Results  Component Value Date   WBC 23.4 (H) 04/30/2019   HGB 7.3 (L) 04/30/2019   HCT 22.5 (L) 04/30/2019   MCV 97.8 04/30/2019   PLT 514 (H) 04/30/2019     Assessment/Plan:  10 Days Post-Op   - will consult plastics for open wound management at this point - BLE Luke Choi 05/02/2019, 10:15 AM [phone]

## 2019-05-02 NOTE — Progress Notes (Signed)
Patient ID: Luke Choi, male   DOB: 11-04-1970, 48 y.o.   MRN: [id number] I updated his wife at the bedside.   Georganna Skeans, MD, MPH, FACS Trauma & General Surgery: [phone]  05/02/2019 11:47 AM

## 2019-05-03 ENCOUNTER — Encounter (HOSPITAL_COMMUNITY): Payer: Self-pay | Admitting: Anesthesiology

## 2019-05-03 ENCOUNTER — Inpatient Hospital Stay (HOSPITAL_COMMUNITY): Payer: BC Managed Care – PPO

## 2019-05-03 LAB — VANCOMYCIN, PEAK: Vancomycin Pk: 27 ug/mL — ABNORMAL LOW (ref 30–40)

## 2019-05-03 LAB — BASIC METABOLIC PANEL
Anion gap: 9 (ref 5–15)
BUN: 22 mg/dL — ABNORMAL HIGH (ref 6–20)
CO2: 33 mmol/L — ABNORMAL HIGH (ref 22–32)
Calcium: 8.1 mg/dL — ABNORMAL LOW (ref 8.9–10.3)
Chloride: 108 mmol/L (ref 98–111)
Creatinine, Ser: 0.52 mg/dL — ABNORMAL LOW (ref 0.61–1.24)
GFR calc Af Amer: >60 mL/min (ref >60)
GFR calc non Af Amer: >60 mL/min (ref >60)
Glucose, Bld: 140 mg/dL — ABNORMAL HIGH (ref 70–99)
Potassium: 4 mmol/L (ref 3.5–5.1)
Sodium: 150 mmol/L — ABNORMAL HIGH (ref 135–145)

## 2019-05-03 LAB — GLUCOSE, CAPILLARY
Glucose-Capillary: 101 mg/dL — ABNORMAL HIGH (ref 70–99)
Glucose-Capillary: 115 mg/dL — ABNORMAL HIGH (ref 70–99)
Glucose-Capillary: 119 mg/dL — ABNORMAL HIGH (ref 70–99)
Glucose-Capillary: 120 mg/dL — ABNORMAL HIGH (ref 70–99)
Glucose-Capillary: 125 mg/dL — ABNORMAL HIGH (ref 70–99)

## 2019-05-03 LAB — POCT I-STAT 7, (LYTES, BLD GAS, ICA,H+H)
Acid-Base Excess: 11 mmol/L — ABNORMAL HIGH (ref 0.0–2.0)
Bicarbonate: 36.2 mmol/L — ABNORMAL HIGH (ref 20.0–28.0)
Calcium, Ion: 1.19 mmol/L (ref 1.15–1.40)
HCT: 21 % — ABNORMAL LOW (ref 39.0–52.0)
Hemoglobin: 7.1 g/dL — ABNORMAL LOW (ref 13.0–17.0)
O2 Saturation: 92 %
Patient temperature: 100.2
Potassium: 3.8 mmol/L (ref 3.5–5.1)
Sodium: 150 mmol/L — ABNORMAL HIGH (ref 135–145)
TCO2: 38 mmol/L — ABNORMAL HIGH (ref 22–32)
pCO2 arterial: 53.2 mmHg — ABNORMAL HIGH (ref 32.0–48.0)
pH, Arterial: 7.445 (ref 7.350–7.450)
pO2, Arterial: 67 mmHg — ABNORMAL LOW (ref 83.0–108.0)

## 2019-05-03 LAB — CBC
HCT: 23.3 % — ABNORMAL LOW (ref 39.0–52.0)
Hemoglobin: 7.1 g/dL — ABNORMAL LOW (ref 13.0–17.0)
MCH: 30.5 pg (ref 26.0–34.0)
MCHC: 30.5 g/dL (ref 30.0–36.0)
MCV: 100 fL (ref 80.0–100.0)
Platelets: 378 10*3/uL (ref 150–400)
RBC: 2.33 MIL/uL — ABNORMAL LOW (ref 4.22–5.81)
RDW: 14.5 % (ref 11.5–15.5)
WBC: 23.2 10*3/uL — ABNORMAL HIGH (ref 4.0–10.5)
nRBC: 0 % (ref 0.0–0.2)

## 2019-05-03 LAB — VANCOMYCIN, TROUGH: Vancomycin Tr: 15 ug/mL (ref 15–20)

## 2019-05-03 LAB — TRIGLYCERIDES: Triglycerides: 148 mg/dL (ref <150)

## 2019-05-03 MED ORDER — FUROSEMIDE 10 MG/ML IJ SOLN
40.0000 mg | Freq: Once | INTRAMUSCULAR | Status: AC
  Administered 2019-05-03: 40 mg via INTRAVENOUS
  Filled 2019-05-03: qty 4

## 2019-05-03 MED ORDER — CEFAZOLIN SODIUM-DEXTROSE 2-4 GM/100ML-% IV SOLN
2.0000 g | INTRAVENOUS | Status: AC
  Filled 2019-05-03: qty 100

## 2019-05-03 MED ORDER — CHLORHEXIDINE GLUCONATE CLOTH 2 % EX PADS
6.0000 | MEDICATED_PAD | Freq: Every day | CUTANEOUS | Status: DC
  Administered 2019-05-04 – 2019-06-03 (×23): 6 via TOPICAL

## 2019-05-03 MED ORDER — CHLORHEXIDINE GLUCONATE CLOTH 2 % EX PADS
6.0000 | MEDICATED_PAD | Freq: Once | CUTANEOUS | Status: AC
  Administered 2019-05-04: 6 via TOPICAL

## 2019-05-03 MED ORDER — CHLORHEXIDINE GLUCONATE CLOTH 2 % EX PADS: 6.0000 | MEDICATED_PAD | Freq: Once | CUTANEOUS | Status: AC

## 2019-05-03 MED ORDER — POTASSIUM CHLORIDE 20 MEQ/15ML (10%) PO SOLN
40.0000 meq | Freq: Once | ORAL | Status: AC
  Administered 2019-05-03: 40 meq
  Filled 2019-05-03: qty 30

## 2019-05-03 MED ORDER — FREE WATER
200.0000 mL | Freq: Three times a day (TID) | Status: DC
  Administered 2019-05-03 – 2019-05-09 (×18): 200 mL
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
## 2019-05-03 NOTE — Progress Notes (Signed)
PT Cancellation Note  Patient Details Name: Luke Choi MRN: [id number] DOB: 12/27/1970   Cancelled Treatment:    Reason Eval/Treat Not Completed: Medical issues which prohibited therapy; patient just taken off paralytics and plan for plastic surgery tomorrow.  Will check back on Thursday.   Reginia Naas 05/03/2019, 11:53 AM  Magda Kiel, [place] [phone] 05/03/2019

## 2019-05-03 NOTE — Progress Notes (Signed)
OT Cancellation Note  Patient Details Name: Luke Choi MRN: [id number] DOB: May 05, 1971   Cancelled Treatment:    Reason Eval/Treat Not Completed: Medical issues which prohibited therapy.  Pt back on vent and sedated.  Will check back.  Lucille Passy, OTR/L Acute Rehabilitation Services Pager [phone] Office [phone]   Lucille Passy M 05/03/2019, 10:04 AM
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
## 2019-05-04 ENCOUNTER — Inpatient Hospital Stay (HOSPITAL_COMMUNITY): Payer: BC Managed Care – PPO

## 2019-05-04 ENCOUNTER — Encounter (HOSPITAL_COMMUNITY): Admission: EM | Disposition: A | Discharge status: Rehabilitation Facility | Payer: Self-pay | Source: Home / Self Care

## 2019-05-04 LAB — GLUCOSE, CAPILLARY
Glucose-Capillary: 103 mg/dL — ABNORMAL HIGH (ref 70–99)
Glucose-Capillary: 105 mg/dL — ABNORMAL HIGH (ref 70–99)
Glucose-Capillary: 106 mg/dL — ABNORMAL HIGH (ref 70–99)
Glucose-Capillary: 111 mg/dL — ABNORMAL HIGH (ref 70–99)
Glucose-Capillary: 116 mg/dL — ABNORMAL HIGH (ref 70–99)
Glucose-Capillary: 97 mg/dL (ref 70–99)

## 2019-05-04 LAB — CBC
HCT: 24.5 % — ABNORMAL LOW (ref 39.0–52.0)
Hemoglobin: 7.3 g/dL — ABNORMAL LOW (ref 13.0–17.0)
MCH: 29.9 pg (ref 26.0–34.0)
MCHC: 29.8 g/dL — ABNORMAL LOW (ref 30.0–36.0)
MCV: 100.4 fL — ABNORMAL HIGH (ref 80.0–100.0)
Platelets: 413 10*3/uL — ABNORMAL HIGH (ref 150–400)
RBC: 2.44 MIL/uL — ABNORMAL LOW (ref 4.22–5.81)
RDW: 14.6 % (ref 11.5–15.5)
WBC: 22.5 10*3/uL — ABNORMAL HIGH (ref 4.0–10.5)
nRBC: 0 % (ref 0.0–0.2)

## 2019-05-04 LAB — CULTURE, RESPIRATORY W GRAM STAIN: Special Requests: NORMAL

## 2019-05-04 LAB — BASIC METABOLIC PANEL
Anion gap: 7 (ref 5–15)
BUN: 16 mg/dL (ref 6–20)
CO2: 22 mmol/L (ref 22–32)
Calcium: 5.7 mg/dL — CL (ref 8.9–10.3)
Chloride: 118 mmol/L — ABNORMAL HIGH (ref 98–111)
Creatinine, Ser: 0.37 mg/dL — ABNORMAL LOW (ref 0.61–1.24)
GFR calc Af Amer: >60 mL/min (ref >60)
GFR calc non Af Amer: >60 mL/min (ref >60)
Glucose, Bld: 88 mg/dL (ref 70–99)
Potassium: 3 mmol/L — ABNORMAL LOW (ref 3.5–5.1)
Sodium: 147 mmol/L — ABNORMAL HIGH (ref 135–145)

## 2019-05-04 LAB — PREPARE RBC (CROSSMATCH)

## 2019-05-04 LAB — TRIGLYCERIDES: Triglycerides: 20 mg/dL (ref <150)

## 2019-05-04 SURGERY — IRRIGATION AND DEBRIDEMENT EXTREMITY
Anesthesia: General | Site: Leg Lower | Laterality: Left

## 2019-05-04 MED ORDER — POTASSIUM CHLORIDE 10 MEQ/50ML IV SOLN
10.0000 meq | INTRAVENOUS | Status: AC
  Administered 2019-05-04 (×6): 10 meq via INTRAVENOUS
  Filled 2019-05-04 (×6): qty 50

## 2019-05-04 MED ORDER — LIDOCAINE HCL (PF) 1 % IJ SOLN
INTRAMUSCULAR | Status: AC
  Filled 2019-05-04: qty 30

## 2019-05-04 MED ORDER — BUPIVACAINE HCL (PF) 0.25 % IJ SOLN
INTRAMUSCULAR | Status: AC
  Filled 2019-05-04: qty 30

## 2019-05-04 MED ORDER — SODIUM CHLORIDE 0.9% IV SOLUTION
Freq: Once | INTRAVENOUS | Status: AC
  Administered 2019-05-04: 13:00:00 via INTRAVENOUS

## 2019-05-04 MED ORDER — CALCIUM GLUCONATE-NACL 1-0.675 GM/50ML-% IV SOLN
1.0000 g | Freq: Once | INTRAVENOUS | Status: AC
  Administered 2019-05-04: 1000 mg via INTRAVENOUS
  Filled 2019-05-04: qty 50
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
## 2019-05-05 ENCOUNTER — Inpatient Hospital Stay (HOSPITAL_COMMUNITY): Payer: BC Managed Care – PPO

## 2019-05-05 LAB — TYPE AND SCREEN
ABO/RH(D): B POS
Antibody Screen: NEGATIVE
Unit division: 0

## 2019-05-05 LAB — BASIC METABOLIC PANEL
Anion gap: 11 (ref 5–15)
BUN: 24 mg/dL — ABNORMAL HIGH (ref 6–20)
CO2: 30 mmol/L (ref 22–32)
Calcium: 7.9 mg/dL — ABNORMAL LOW (ref 8.9–10.3)
Chloride: 105 mmol/L (ref 98–111)
Creatinine, Ser: 0.49 mg/dL — ABNORMAL LOW (ref 0.61–1.24)
GFR calc Af Amer: >60 mL/min (ref >60)
GFR calc non Af Amer: >60 mL/min (ref >60)
Glucose, Bld: 118 mg/dL — ABNORMAL HIGH (ref 70–99)
Potassium: 3.8 mmol/L (ref 3.5–5.1)
Sodium: 146 mmol/L — ABNORMAL HIGH (ref 135–145)

## 2019-05-05 LAB — POCT I-STAT 7, (LYTES, BLD GAS, ICA,H+H)
Acid-Base Excess: 5 mmol/L — ABNORMAL HIGH (ref 0.0–2.0)
Bicarbonate: 33.4 mmol/L — ABNORMAL HIGH (ref 20.0–28.0)
Calcium, Ion: 1.17 mmol/L (ref 1.15–1.40)
HCT: 46 % (ref 39.0–52.0)
Hemoglobin: 15.6 g/dL (ref 13.0–17.0)
O2 Saturation: 98 %
Patient temperature: 99.3
Potassium: 4.1 mmol/L (ref 3.5–5.1)
Sodium: 145 mmol/L (ref 135–145)
TCO2: 35 mmol/L — ABNORMAL HIGH (ref 22–32)
pCO2 arterial: 68.2 mmHg (ref 32.0–48.0)
pH, Arterial: 7.3 — ABNORMAL LOW (ref 7.350–7.450)
pO2, Arterial: 113 mmHg — ABNORMAL HIGH (ref 83.0–108.0)

## 2019-05-05 LAB — CBC
HCT: 28 % — ABNORMAL LOW (ref 39.0–52.0)
Hemoglobin: 8.7 g/dL — ABNORMAL LOW (ref 13.0–17.0)
MCH: 30.2 pg (ref 26.0–34.0)
MCHC: 31.1 g/dL (ref 30.0–36.0)
MCV: 97.2 fL (ref 80.0–100.0)
Platelets: 388 10*3/uL (ref 150–400)
RBC: 2.88 MIL/uL — ABNORMAL LOW (ref 4.22–5.81)
RDW: 14.6 % (ref 11.5–15.5)
WBC: 22.5 10*3/uL — ABNORMAL HIGH (ref 4.0–10.5)
nRBC: 0 % (ref 0.0–0.2)

## 2019-05-05 LAB — GLUCOSE, CAPILLARY
Glucose-Capillary: 102 mg/dL — ABNORMAL HIGH (ref 70–99)
Glucose-Capillary: 106 mg/dL — ABNORMAL HIGH (ref 70–99)
Glucose-Capillary: 110 mg/dL — ABNORMAL HIGH (ref 70–99)
Glucose-Capillary: 112 mg/dL — ABNORMAL HIGH (ref 70–99)
Glucose-Capillary: 128 mg/dL — ABNORMAL HIGH (ref 70–99)
Glucose-Capillary: 139 mg/dL — ABNORMAL HIGH (ref 70–99)

## 2019-05-05 LAB — BPAM RBC
Blood Product Expiration Date: 202010232359
ISSUE DATE / TIME: 202009231250
Unit Type and Rh: 7300

## 2019-05-05 LAB — TRIGLYCERIDES: Triglycerides: 243 mg/dL — ABNORMAL HIGH (ref <150)

## 2019-05-05 LAB — ALBUMIN: Albumin: 1.6 g/dL — ABNORMAL LOW (ref 3.5–5.0)

## 2019-05-05 MED ORDER — ARTIFICIAL TEARS OPHTHALMIC OINT: 1.0000 "application " | TOPICAL_OINTMENT | Freq: Three times a day (TID) | OPHTHALMIC | Status: DC

## 2019-05-05 MED ORDER — FENTANYL BOLUS VIA INFUSION
50.0000 ug | INTRAVENOUS | Status: DC | PRN
  Administered 2019-05-19 – 2019-05-20 (×6): 50 ug via INTRAVENOUS
  Filled 2019-05-05: qty 50

## 2019-05-05 MED ORDER — FENTANYL CITRATE (PF) 100 MCG/2ML IJ SOLN
50.0000 ug | Freq: Once | INTRAMUSCULAR | Status: AC
  Administered 2019-05-05: 08:00:00 50 ug via INTRAVENOUS

## 2019-05-05 MED ORDER — PROPOFOL 1000 MG/100ML IV EMUL
25.0000 ug/kg/min | INTRAVENOUS | Status: DC
  Administered 2019-05-05 (×2): 70 ug/kg/min via INTRAVENOUS
  Administered 2019-05-05: 60 ug/kg/min via INTRAVENOUS
  Administered 2019-05-05: 70 ug/kg/min via INTRAVENOUS
  Administered 2019-05-05: 22:00:00 60 ug/kg/min via INTRAVENOUS
  Administered 2019-05-05: 70 ug/kg/min via INTRAVENOUS
  Administered 2019-05-06 (×2): 55 ug/kg/min via INTRAVENOUS
  Administered 2019-05-06: 02:00:00 60 ug/kg/min via INTRAVENOUS
  Administered 2019-05-06: 55 ug/kg/min via INTRAVENOUS
  Administered 2019-05-06: 60 ug/kg/min via INTRAVENOUS
  Administered 2019-05-06 – 2019-05-08 (×10): 55 ug/kg/min via INTRAVENOUS
  Administered 2019-05-08 (×2): 60 ug/kg/min via INTRAVENOUS
  Administered 2019-05-08: 65 ug/kg/min via INTRAVENOUS
  Administered 2019-05-08 (×2): 60 ug/kg/min via INTRAVENOUS
  Administered 2019-05-08: 65 ug/kg/min via INTRAVENOUS
  Administered 2019-05-09 (×8): 60 ug/kg/min via INTRAVENOUS
  Administered 2019-05-10: 50 ug/kg/min via INTRAVENOUS
  Administered 2019-05-10: 55 ug/kg/min via INTRAVENOUS
  Administered 2019-05-10 – 2019-05-11 (×5): 60 ug/kg/min via INTRAVENOUS
  Administered 2019-05-11: 45 ug/kg/min via INTRAVENOUS
  Administered 2019-05-11: 50 ug/kg/min via INTRAVENOUS
  Administered 2019-05-11: 60 ug/kg/min via INTRAVENOUS
  Administered 2019-05-11 – 2019-05-12 (×6): 50 ug/kg/min via INTRAVENOUS
  Administered 2019-05-12: 45 ug/kg/min via INTRAVENOUS
  Administered 2019-05-13: 40 ug/kg/min via INTRAVENOUS
  Administered 2019-05-13: 60 ug/kg/min via INTRAVENOUS
  Administered 2019-05-13: 20:00:00 59.978 ug/kg/min via INTRAVENOUS
  Administered 2019-05-13 – 2019-05-14 (×4): 60 ug/kg/min via INTRAVENOUS
  Filled 2019-05-05: qty 100
  Filled 2019-05-05: qty 200
  Filled 2019-05-05 (×18): qty 100
  Filled 2019-05-05: qty 200
  Filled 2019-05-05 (×15): qty 100
  Filled 2019-05-05: qty 200
  Filled 2019-05-05 (×24): qty 100

## 2019-05-05 MED ORDER — FENTANYL 2500MCG IN NS 250ML (10MCG/ML) PREMIX INFUSION: 50.0000 ug/h | INTRAVENOUS | Status: DC

## 2019-05-05 MED ORDER — FENTANYL 2500MCG IN NS 250ML (10MCG/ML) PREMIX INFUSION
50.0000 ug/h | INTRAVENOUS | Status: DC
  Administered 2019-05-05: 20:00:00 175 ug/h via INTRAVENOUS
  Administered 2019-05-05: 250 ug/h via INTRAVENOUS
  Administered 2019-05-06 – 2019-05-07 (×3): 150 ug/h via INTRAVENOUS
  Administered 2019-05-08 – 2019-05-10 (×5): 200 ug/h via INTRAVENOUS
  Administered 2019-05-10 – 2019-05-12 (×6): 300 ug/h via INTRAVENOUS
  Administered 2019-05-13: 275 ug/h via INTRAVENOUS
  Administered 2019-05-13 – 2019-05-14 (×4): 300 ug/h via INTRAVENOUS
  Administered 2019-05-14: 275 ug/h via INTRAVENOUS
  Administered 2019-05-15 (×3): 300 ug/h via INTRAVENOUS
  Administered 2019-05-16: 250 ug/h via INTRAVENOUS
  Administered 2019-05-16: 275 ug/h via INTRAVENOUS
  Administered 2019-05-16 – 2019-05-17 (×2): 300 ug/h via INTRAVENOUS
  Administered 2019-05-17: 275 ug/h via INTRAVENOUS
  Administered 2019-05-17 – 2019-05-19 (×5): 300 ug/h via INTRAVENOUS
  Administered 2019-05-19 – 2019-05-20 (×2): 200 ug/h via INTRAVENOUS
  Filled 2019-05-05 (×36): qty 250

## 2019-05-05 MED ORDER — ARTIFICIAL TEARS OPHTHALMIC OINT
1.0000 "application " | TOPICAL_OINTMENT | Freq: Three times a day (TID) | OPHTHALMIC | Status: DC
  Administered 2019-05-05 – 2019-05-17 (×32): 1 via OPHTHALMIC
  Filled 2019-05-05: qty 3.5

## 2019-05-05 MED ORDER — POTASSIUM CHLORIDE 20 MEQ/15ML (10%) PO SOLN
40.0000 meq | Freq: Once | ORAL | Status: AC
  Administered 2019-05-05: 40 meq
  Filled 2019-05-05: qty 30

## 2019-05-05 MED ORDER — FENTANYL CITRATE (PF) 100 MCG/2ML IJ SOLN: 50.0000 ug | Freq: Once | INTRAMUSCULAR | Status: DC

## 2019-05-05 MED ORDER — FUROSEMIDE 10 MG/ML IJ SOLN
40.0000 mg | Freq: Once | INTRAMUSCULAR | Status: AC
  Administered 2019-05-05: 40 mg via INTRAVENOUS
  Filled 2019-05-05: qty 4

## 2019-05-05 MED ORDER — VECURONIUM BOLUS VIA INFUSION
5.0000 mg | Freq: Once | INTRAVENOUS | Status: AC
  Administered 2019-05-05: 5 mg via INTRAVENOUS
  Filled 2019-05-05: qty 5

## 2019-05-05 MED ORDER — VECURONIUM BROMIDE 10 MG IV SOLR
0.0000 ug/kg/min | INTRAVENOUS | Status: DC
  Administered 2019-05-05: 1.7 ug/kg/min via INTRAVENOUS
  Administered 2019-05-05: 1 ug/kg/min via INTRAVENOUS
  Administered 2019-05-06 – 2019-05-09 (×9): 1.7 ug/kg/min via INTRAVENOUS
  Filled 2019-05-05 (×18): qty 100

## 2019-05-05 MED ORDER — SODIUM CHLORIDE 0.9 % IV SOLN
2.0000 g | INTRAVENOUS | Status: AC
  Administered 2019-05-05 – 2019-05-11 (×7): 2 g via INTRAVENOUS
  Filled 2019-05-05 (×7): qty 2
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
## 2019-05-05 NOTE — Care Management (Signed)
Pt inappropriate for SBIRT assessment at this time, as remains sedated and intubated.  Jahira Swiss W. Westen Dinino, RN, BSN  Trauma/Neuro ICU Case Manager [phone] 

## 2019-05-05 NOTE — Progress Notes (Signed)
Patient ID: Luke Choi, male   DOB: 03-03-1971, 48 y.o.   MRN: [id number] Asked to reevaluate patient secondary to decline in neurologic status.  Patient had been intermittently awake and following commands have been extubated however required reinnervation for severe pneumonia that is progressed to ARDS.  This morning patient would open his eyes and track but would not follow commands minimal movement to noxious stimulation.  Previous CT scan showed minimal to no intracerebral hemorrhage mild amount of axonal injury no known cervical spine injury.  Agree with repeat CT scan will reevaluate.

## 2019-05-05 NOTE — Progress Notes (Signed)
RT NOTE: RT transported patient on ventilator from room 4N30 to CT and back to room 4N30 with no apparent complications. Vitals are stable. RT will continue to monitor.

## 2019-05-05 NOTE — Progress Notes (Signed)
PT Cancellation Note  Patient Details Name: Luke Choi MRN: [id number] DOB: 19-Jul-1971   Cancelled Treatment:    Reason Eval/Treat Not Completed: Patient at procedure or test/unavailable.  Procedure happening in room currently.  PT to check back later today or tomorrow as time allows.   Thanks,  Barbarann Ehlers. Yorel Redder, PT, DPT  Acute Rehabilitation [phone] pager [phone] office  @ [facility]: [phone]     Harvie Heck 05/05/2019, 11:51 AM

## 2019-05-06 ENCOUNTER — Inpatient Hospital Stay (HOSPITAL_COMMUNITY): Payer: BC Managed Care – PPO

## 2019-05-06 DIAGNOSIS — J9601 Acute respiratory failure with hypoxia: Secondary | ICD-10-CM

## 2019-05-06 LAB — POCT I-STAT 7, (LYTES, BLD GAS, ICA,H+H)
Acid-Base Excess: 8 mmol/L — ABNORMAL HIGH (ref 0.0–2.0)
Acid-Base Excess: 10 mmol/L — ABNORMAL HIGH (ref 0.0–2.0)
Bicarbonate: 35.6 mmol/L — ABNORMAL HIGH (ref 20.0–28.0)
Bicarbonate: 38.8 mmol/L — ABNORMAL HIGH (ref 20.0–28.0)
Calcium, Ion: 1.2 mmol/L (ref 1.15–1.40)
Calcium, Ion: 1.18 mmol/L (ref 1.15–1.40)
HCT: 35 % — ABNORMAL LOW (ref 39.0–52.0)
HCT: 26 % — ABNORMAL LOW (ref 39.0–52.0)
Hemoglobin: 11.9 g/dL — ABNORMAL LOW (ref 13.0–17.0)
Hemoglobin: 8.8 g/dL — ABNORMAL LOW (ref 13.0–17.0)
O2 Saturation: 85 %
O2 Saturation: 95 %
Patient temperature: 99.5
Patient temperature: 38.3
Potassium: 3.9 mmol/L (ref 3.5–5.1)
Potassium: 4.3 mmol/L (ref 3.5–5.1)
Sodium: 145 mmol/L (ref 135–145)
Sodium: 146 mmol/L — ABNORMAL HIGH (ref 135–145)
TCO2: 38 mmol/L — ABNORMAL HIGH (ref 22–32)
TCO2: 41 mmol/L — ABNORMAL HIGH (ref 22–32)
pCO2 arterial: 68.2 mmHg (ref 32.0–48.0)
pCO2 arterial: 86.4 mmHg (ref 32.0–48.0)
pH, Arterial: 7.328 — ABNORMAL LOW (ref 7.350–7.450)
pH, Arterial: 7.267 — ABNORMAL LOW (ref 7.350–7.450)
pO2, Arterial: 57 mmHg — ABNORMAL LOW (ref 83.0–108.0)
pO2, Arterial: 97 mmHg (ref 83.0–108.0)

## 2019-05-06 LAB — BASIC METABOLIC PANEL
Anion gap: 8 (ref 5–15)
BUN: 28 mg/dL — ABNORMAL HIGH (ref 6–20)
CO2: 32 mmol/L (ref 22–32)
Calcium: 7.9 mg/dL — ABNORMAL LOW (ref 8.9–10.3)
Chloride: 103 mmol/L (ref 98–111)
Creatinine, Ser: 0.55 mg/dL — ABNORMAL LOW (ref 0.61–1.24)
GFR calc Af Amer: >60 mL/min (ref >60)
GFR calc non Af Amer: >60 mL/min (ref >60)
Glucose, Bld: 155 mg/dL — ABNORMAL HIGH (ref 70–99)
Potassium: 3.7 mmol/L (ref 3.5–5.1)
Sodium: 143 mmol/L (ref 135–145)

## 2019-05-06 LAB — HEMOGLOBIN A1C
Hgb A1c MFr Bld: 5.2 % (ref 4.8–5.6)
Mean Plasma Glucose: 102.54 mg/dL

## 2019-05-06 LAB — TRIGLYCERIDES: Triglycerides: 307 mg/dL — ABNORMAL HIGH (ref <150)

## 2019-05-06 LAB — CBC
HCT: 25.1 % — ABNORMAL LOW (ref 39.0–52.0)
Hemoglobin: 7.5 g/dL — ABNORMAL LOW (ref 13.0–17.0)
MCH: 29.6 pg (ref 26.0–34.0)
MCHC: 29.9 g/dL — ABNORMAL LOW (ref 30.0–36.0)
MCV: 99.2 fL (ref 80.0–100.0)
Platelets: 343 10*3/uL (ref 150–400)
RBC: 2.53 MIL/uL — ABNORMAL LOW (ref 4.22–5.81)
RDW: 14.5 % (ref 11.5–15.5)
WBC: 16.7 10*3/uL — ABNORMAL HIGH (ref 4.0–10.5)
nRBC: 0.1 % (ref 0.0–0.2)

## 2019-05-06 LAB — GLUCOSE, CAPILLARY
Glucose-Capillary: 131 mg/dL — ABNORMAL HIGH (ref 70–99)
Glucose-Capillary: 121 mg/dL — ABNORMAL HIGH (ref 70–99)
Glucose-Capillary: 143 mg/dL — ABNORMAL HIGH (ref 70–99)
Glucose-Capillary: 110 mg/dL — ABNORMAL HIGH (ref 70–99)
Glucose-Capillary: 115 mg/dL — ABNORMAL HIGH (ref 70–99)

## 2019-05-06 MED ORDER — FUROSEMIDE 10 MG/ML IJ SOLN
40.0000 mg | Freq: Two times a day (BID) | INTRAMUSCULAR | Status: AC
  Administered 2019-05-06 (×2): 40 mg via INTRAVENOUS
  Filled 2019-05-06 (×2): qty 4

## 2019-05-06 MED ORDER — INSULIN ASPART 100 UNIT/ML ~~LOC~~ SOLN
0.0000 [IU] | SUBCUTANEOUS | Status: DC
  Administered 2019-05-06 – 2019-05-11 (×18): 2 [IU] via SUBCUTANEOUS
  Administered 2019-05-11: 1 [IU] via SUBCUTANEOUS
  Administered 2019-05-11 – 2019-05-14 (×9): 2 [IU] via SUBCUTANEOUS
  Administered 2019-05-14: 3 [IU] via SUBCUTANEOUS
  Administered 2019-05-14 – 2019-05-16 (×11): 2 [IU] via SUBCUTANEOUS
  Administered 2019-05-16: 20:00:00 3 [IU] via SUBCUTANEOUS
  Administered 2019-05-17: 2 [IU] via SUBCUTANEOUS
  Administered 2019-05-17 (×2): 3 [IU] via SUBCUTANEOUS
  Administered 2019-05-17: 2 [IU] via SUBCUTANEOUS
  Administered 2019-05-17 – 2019-05-18 (×3): 3 [IU] via SUBCUTANEOUS
  Administered 2019-05-18 – 2019-05-19 (×6): 2 [IU] via SUBCUTANEOUS
  Administered 2019-05-19: 3 [IU] via SUBCUTANEOUS
  Administered 2019-05-19: 01:00:00 2 [IU] via SUBCUTANEOUS
  Administered 2019-05-19: 3 [IU] via SUBCUTANEOUS
  Administered 2019-05-19: 2 [IU] via SUBCUTANEOUS
  Administered 2019-05-19: 3 [IU] via SUBCUTANEOUS
  Administered 2019-05-20 – 2019-05-21 (×6): 2 [IU] via SUBCUTANEOUS
  Administered 2019-05-21: 3 [IU] via SUBCUTANEOUS
  Administered 2019-05-21 (×2): 2 [IU] via SUBCUTANEOUS
  Administered 2019-05-21: 3 [IU] via SUBCUTANEOUS
  Administered 2019-05-22 – 2019-06-08 (×31): 2 [IU] via SUBCUTANEOUS

## 2019-05-06 MED ORDER — POTASSIUM CHLORIDE 20 MEQ/15ML (10%) PO SOLN
40.0000 meq | Freq: Once | ORAL | Status: AC
  Administered 2019-05-06: 40 meq
  Filled 2019-05-06: qty 30

## 2019-05-06 MED ORDER — SODIUM CHLORIDE 0.9 % IV SOLN: INTRAVENOUS | Status: DC | PRN
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
## 2019-05-06 NOTE — Plan of Care (Signed)
  Problem: Clinical Measurements: Goal: Cardiovascular complication will be avoided Outcome: Progressing   Problem: Nutrition: Goal: Adequate nutrition will be maintained Outcome: Progressing   Problem: Pain Managment: Goal: General experience of comfort will improve Outcome: Progressing   Problem: Skin Integrity: Goal: Risk for impaired skin integrity will decrease Outcome: Progressing   Problem: Clinical Measurements: Goal: Ability to maintain clinical measurements within normal limits will improve Outcome: Not Progressing Goal: Respiratory complications will improve Outcome: Not Progressing   Problem: Activity: Goal: Risk for activity intolerance will decrease Outcome: Not Progressing   Pt still on paralytics at this time
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
## 2019-05-06 NOTE — Progress Notes (Signed)
PT Cancellation/Discharge Note  Patient Details Name: Luke Choi MRN: [id number] DOB: [birth year]   Cancelled Treatment:    Reason Eval/Treat Not Completed: Medical issues which prohibited therapy.  PT to sign off.  Worsening ARDS.  Pt is very critically ill.  Please re-order PT once more stable.   Thanks,  Barbarann Ehlers. Twanna Resh, PT, DPT  Acute Rehabilitation [phone] pager [phone] office  @ [facility]: [phone]     Harvie Heck 05/06/2019, 3:07 PM

## 2019-05-06 NOTE — Progress Notes (Signed)
Wasted 15 ml fentanyl from bag with RN Lily Peer In sink
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
## 2019-05-06 NOTE — Procedures (Signed)
Arterial Catheter Insertion Procedure Note Luke Choi [id number] 09-Feb-1971  Procedure: Insertion of Arterial Catheter  Indications: Blood pressure monitoring and Frequent blood sampling  Procedure Details Consent: Risks of procedure as well as the alternatives and risks of each were explained to the (patient/caregiver).  Consent for procedure obtained. Time Out: Verified patient identification, verified procedure, site/side was marked, verified correct patient position, special equipment/implants available, medications/allergies/relevent history reviewed, required imaging and test results available.  Performed  Maximum sterile technique was used including cap, gloves, gown, hand hygiene, mask and sheet. Skin prep: Chlorhexidine; local anesthetic administered 20 gauge catheter was inserted into right radial artery using the Seldinger technique. ULTRASOUND GUIDANCE USED: NO Evaluation Blood flow good; BP tracing good. Complications: No apparent complications   Assisted by Luke Choi, RRT.   Claretta Fraise 05/06/2019

## 2019-05-07 ENCOUNTER — Inpatient Hospital Stay (HOSPITAL_COMMUNITY): Payer: BC Managed Care – PPO

## 2019-05-07 DIAGNOSIS — R609 Edema, unspecified: Secondary | ICD-10-CM

## 2019-05-07 LAB — BASIC METABOLIC PANEL
Anion gap: 9 (ref 5–15)
BUN: 24 mg/dL — ABNORMAL HIGH (ref 6–20)
CO2: 37 mmol/L — ABNORMAL HIGH (ref 22–32)
Calcium: 8 mg/dL — ABNORMAL LOW (ref 8.9–10.3)
Chloride: 102 mmol/L (ref 98–111)
Creatinine, Ser: 0.48 mg/dL — ABNORMAL LOW (ref 0.61–1.24)
GFR calc Af Amer: >60 mL/min (ref >60)
GFR calc non Af Amer: >60 mL/min (ref >60)
Glucose, Bld: 123 mg/dL — ABNORMAL HIGH (ref 70–99)
Potassium: 3.9 mmol/L (ref 3.5–5.1)
Sodium: 148 mmol/L — ABNORMAL HIGH (ref 135–145)

## 2019-05-07 LAB — GLUCOSE, CAPILLARY
Glucose-Capillary: 112 mg/dL — ABNORMAL HIGH (ref 70–99)
Glucose-Capillary: 122 mg/dL — ABNORMAL HIGH (ref 70–99)
Glucose-Capillary: 132 mg/dL — ABNORMAL HIGH (ref 70–99)
Glucose-Capillary: 139 mg/dL — ABNORMAL HIGH (ref 70–99)
Glucose-Capillary: 121 mg/dL — ABNORMAL HIGH (ref 70–99)
Glucose-Capillary: 131 mg/dL — ABNORMAL HIGH (ref 70–99)
Glucose-Capillary: 133 mg/dL — ABNORMAL HIGH (ref 70–99)

## 2019-05-07 LAB — CBC
HCT: 26.1 % — ABNORMAL LOW (ref 39.0–52.0)
Hemoglobin: 7.5 g/dL — ABNORMAL LOW (ref 13.0–17.0)
MCH: 29 pg (ref 26.0–34.0)
MCHC: 28.7 g/dL — ABNORMAL LOW (ref 30.0–36.0)
MCV: 100.8 fL — ABNORMAL HIGH (ref 80.0–100.0)
Platelets: 471 10*3/uL — ABNORMAL HIGH (ref 150–400)
RBC: 2.59 MIL/uL — ABNORMAL LOW (ref 4.22–5.81)
RDW: 14.6 % (ref 11.5–15.5)
WBC: 15.7 10*3/uL — ABNORMAL HIGH (ref 4.0–10.5)
nRBC: 0.1 % (ref 0.0–0.2)

## 2019-05-07 LAB — POCT I-STAT 7, (LYTES, BLD GAS, ICA,H+H)
Acid-Base Excess: 18 mmol/L — ABNORMAL HIGH (ref 0.0–2.0)
Bicarbonate: 45.2 mmol/L — ABNORMAL HIGH (ref 20.0–28.0)
Calcium, Ion: 1.15 mmol/L (ref 1.15–1.40)
HCT: 24 % — ABNORMAL LOW (ref 39.0–52.0)
Hemoglobin: 8.2 g/dL — ABNORMAL LOW (ref 13.0–17.0)
O2 Saturation: 97 %
Potassium: 3.8 mmol/L (ref 3.5–5.1)
Sodium: 148 mmol/L — ABNORMAL HIGH (ref 135–145)
TCO2: 47 mmol/L — ABNORMAL HIGH (ref 22–32)
pCO2 arterial: 73.5 mmHg (ref 32.0–48.0)
pH, Arterial: 7.396 (ref 7.350–7.450)
pO2, Arterial: 92 mmHg (ref 83.0–108.0)

## 2019-05-07 LAB — TRIGLYCERIDES: Triglycerides: 234 mg/dL — ABNORMAL HIGH (ref <150)

## 2019-05-07 MED ORDER — ENOXAPARIN SODIUM 80 MG/0.8ML ~~LOC~~ SOLN
1.0000 mg/kg | Freq: Two times a day (BID) | SUBCUTANEOUS | Status: DC
  Administered 2019-05-07 – 2019-05-23 (×32): 80 mg via SUBCUTANEOUS
  Filled 2019-05-07 (×32): qty 0.8

## 2019-05-07 MED ORDER — WARFARIN SODIUM 7.5 MG PO TABS
7.5000 mg | ORAL_TABLET | Freq: Once | ORAL | Status: DC
  Filled 2019-05-07: qty 1

## 2019-05-07 MED ORDER — WARFARIN - PHARMACIST DOSING INPATIENT
Freq: Every day | Status: DC
  Administered 2019-05-09 – 2019-05-10 (×2)

## 2019-05-07 MED ORDER — ENOXAPARIN SODIUM 80 MG/0.8ML ~~LOC~~ SOLN: 1.0000 mg/kg | Freq: Two times a day (BID) | SUBCUTANEOUS | Status: DC

## 2019-05-07 MED ORDER — WARFARIN SODIUM 7.5 MG PO TABS
7.5000 mg | ORAL_TABLET | Freq: Once | ORAL | Status: AC
  Administered 2019-05-07: 7.5 mg
  Filled 2019-05-07: qty 1
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
## 2019-05-07 NOTE — Progress Notes (Signed)
RLE DVT noted.   Discussed any risks with trauma/general surgery and NSU; no contraindication to anticoagulation from their perspective. Will thus transition from prophylactic dosing of lovenox to therapeutic (patient has numerous infusions running and limited access that would preclude heparin gtt), ask pharmacy to dose coumadin and will follow INR.    Bonna Gains, MD PhD 3:15 PM 05/07/19
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
## 2019-05-07 NOTE — Progress Notes (Signed)
Bilateral lower extremity venous duplex completed. Preliminary results in Chart review CV Proc. [facility], [place] 05/07/2019, 1:01 PM
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
## 2019-05-08 ENCOUNTER — Inpatient Hospital Stay (HOSPITAL_COMMUNITY): Payer: BC Managed Care – PPO

## 2019-05-08 LAB — BLOOD GAS, ARTERIAL
Acid-Base Excess: 17.1 mmol/L — ABNORMAL HIGH (ref 0.0–2.0)
Bicarbonate: 42.8 mmol/L — ABNORMAL HIGH (ref 20.0–28.0)
Drawn by: 548871
FIO2: 0.6
MECHVT: 430 mL
O2 Saturation: 96.4 %
PEEP: 14 cmH2O
Patient temperature: 98.6
RATE: 35 resp/min
pCO2 arterial: 69.2 mmHg (ref 32.0–48.0)
pH, Arterial: 7.408 (ref 7.350–7.450)
pO2, Arterial: 81 mmHg — ABNORMAL LOW (ref 83.0–108.0)

## 2019-05-08 LAB — GLUCOSE, CAPILLARY
Glucose-Capillary: 104 mg/dL — ABNORMAL HIGH (ref 70–99)
Glucose-Capillary: 112 mg/dL — ABNORMAL HIGH (ref 70–99)
Glucose-Capillary: 120 mg/dL — ABNORMAL HIGH (ref 70–99)
Glucose-Capillary: 125 mg/dL — ABNORMAL HIGH (ref 70–99)
Glucose-Capillary: 129 mg/dL — ABNORMAL HIGH (ref 70–99)
Glucose-Capillary: 140 mg/dL — ABNORMAL HIGH (ref 70–99)

## 2019-05-08 LAB — BASIC METABOLIC PANEL
Anion gap: 8 (ref 5–15)
BUN: 23 mg/dL — ABNORMAL HIGH (ref 6–20)
CO2: 40 mmol/L — ABNORMAL HIGH (ref 22–32)
Calcium: 8.2 mg/dL — ABNORMAL LOW (ref 8.9–10.3)
Chloride: 103 mmol/L (ref 98–111)
Creatinine, Ser: 0.45 mg/dL — ABNORMAL LOW (ref 0.61–1.24)
GFR calc Af Amer: >60 mL/min (ref >60)
GFR calc non Af Amer: >60 mL/min (ref >60)
Glucose, Bld: 105 mg/dL — ABNORMAL HIGH (ref 70–99)
Potassium: 3.9 mmol/L (ref 3.5–5.1)
Sodium: 151 mmol/L — ABNORMAL HIGH (ref 135–145)

## 2019-05-08 LAB — TRIGLYCERIDES: Triglycerides: 208 mg/dL — ABNORMAL HIGH (ref <150)

## 2019-05-08 LAB — CBC
HCT: 25.2 % — ABNORMAL LOW (ref 39.0–52.0)
Hemoglobin: 7.1 g/dL — ABNORMAL LOW (ref 13.0–17.0)
MCH: 28.7 pg (ref 26.0–34.0)
MCHC: 28.2 g/dL — ABNORMAL LOW (ref 30.0–36.0)
MCV: 102 fL — ABNORMAL HIGH (ref 80.0–100.0)
Platelets: 473 10*3/uL — ABNORMAL HIGH (ref 150–400)
RBC: 2.47 MIL/uL — ABNORMAL LOW (ref 4.22–5.81)
RDW: 14.5 % (ref 11.5–15.5)
WBC: 13.6 10*3/uL — ABNORMAL HIGH (ref 4.0–10.5)
nRBC: 0.1 % (ref 0.0–0.2)

## 2019-05-08 LAB — PROTIME-INR
INR: 1.1 (ref 0.8–1.2)
Prothrombin Time: 14.1 s (ref 11.4–15.2)

## 2019-05-08 MED ORDER — WARFARIN SODIUM 7.5 MG PO TABS
7.5000 mg | ORAL_TABLET | Freq: Once | ORAL | Status: AC
  Administered 2019-05-08: 17:00:00 7.5 mg
  Filled 2019-05-08: qty 1

## 2019-05-08 NOTE — Progress Notes (Signed)
Critical ABG results RBV Luke Hakim, MD at [place] on 9/27 by Otho Ket, RRT   PH: 7.40 PCO2: 69.2* PO2: 81 HCO3: 42.8
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
## 2019-05-09 DIAGNOSIS — S8251XA Displaced fracture of medial malleolus of right tibia, initial encounter for closed fracture: Secondary | ICD-10-CM | POA: Diagnosis not present

## 2019-05-09 DIAGNOSIS — J80 Acute respiratory distress syndrome: Secondary | ICD-10-CM | POA: Diagnosis not present

## 2019-05-09 LAB — CULTURE, BLOOD (ROUTINE X 2)
Culture: NO GROWTH
Culture: NO GROWTH
Special Requests: ADEQUATE

## 2019-05-09 LAB — GLUCOSE, CAPILLARY
Glucose-Capillary: 104 mg/dL — ABNORMAL HIGH (ref 70–99)
Glucose-Capillary: 106 mg/dL — ABNORMAL HIGH (ref 70–99)
Glucose-Capillary: 116 mg/dL — ABNORMAL HIGH (ref 70–99)
Glucose-Capillary: 120 mg/dL — ABNORMAL HIGH (ref 70–99)
Glucose-Capillary: 133 mg/dL — ABNORMAL HIGH (ref 70–99)
Glucose-Capillary: 140 mg/dL — ABNORMAL HIGH (ref 70–99)

## 2019-05-09 LAB — POCT I-STAT 7, (LYTES, BLD GAS, ICA,H+H)
Acid-Base Excess: 15 mmol/L — ABNORMAL HIGH (ref 0.0–2.0)
Bicarbonate: 42.5 mmol/L — ABNORMAL HIGH (ref 20.0–28.0)
Calcium, Ion: 1.2 mmol/L (ref 1.15–1.40)
HCT: 25 % — ABNORMAL LOW (ref 39.0–52.0)
Hemoglobin: 8.5 g/dL — ABNORMAL LOW (ref 13.0–17.0)
O2 Saturation: 96 %
Potassium: 4.1 mmol/L (ref 3.5–5.1)
Sodium: 148 mmol/L — ABNORMAL HIGH (ref 135–145)
TCO2: 45 mmol/L — ABNORMAL HIGH (ref 22–32)
pCO2 arterial: 72.5 mmHg (ref 32.0–48.0)
pH, Arterial: 7.376 (ref 7.350–7.450)
pO2, Arterial: 85 mmHg (ref 83.0–108.0)

## 2019-05-09 LAB — TRIGLYCERIDES: Triglycerides: 235 mg/dL — ABNORMAL HIGH (ref <150)

## 2019-05-09 LAB — PROTIME-INR
INR: 1.1 (ref 0.8–1.2)
Prothrombin Time: 14.2 s (ref 11.4–15.2)

## 2019-05-09 MED ORDER — PANTOPRAZOLE SODIUM 40 MG PO PACK
40.0000 mg | PACK | Freq: Every day | ORAL | Status: DC
  Administered 2019-05-10 – 2019-05-30 (×20): 40 mg
  Filled 2019-05-09 (×20): qty 20

## 2019-05-09 MED ORDER — ACETAZOLAMIDE SODIUM 500 MG IJ SOLR
250.0000 mg | Freq: Two times a day (BID) | INTRAMUSCULAR | Status: DC
  Administered 2019-05-09 (×2): 250 mg via INTRAVENOUS
  Filled 2019-05-09 (×4): qty 250

## 2019-05-09 MED ORDER — FREE WATER
300.0000 mL | Status: DC
  Administered 2019-05-09 – 2019-05-11 (×12): 300 mL

## 2019-05-09 MED ORDER — FUROSEMIDE 10 MG/ML IJ SOLN
40.0000 mg | Freq: Two times a day (BID) | INTRAMUSCULAR | Status: AC
  Administered 2019-05-09 (×2): 40 mg via INTRAVENOUS
  Filled 2019-05-09 (×2): qty 4

## 2019-05-09 MED ORDER — POTASSIUM CHLORIDE 20 MEQ PO PACK
40.0000 meq | PACK | Freq: Two times a day (BID) | ORAL | Status: AC
  Administered 2019-05-09 (×2): 40 meq
  Filled 2019-05-09 (×2): qty 2

## 2019-05-09 MED ORDER — WARFARIN SODIUM 10 MG PO TABS
10.0000 mg | ORAL_TABLET | Freq: Once | ORAL | Status: AC
  Administered 2019-05-09: 10 mg
  Filled 2019-05-09: qty 1

## 2019-05-09 NOTE — Progress Notes (Signed)
Occupational Therapy Discharge Patient Details Name: Luke Choi MRN: [id number] DOB: 08/20/1970 Today's Date: 05/09/2019 Time:  -     Patient discharged from OT services secondary to medical decline - will need to re-order OT to resume therapy services. Pt currently on paralytics.   Please see latest therapy progress note for current level of functioning and progress toward goals.    Progress and discharge plan discussed with patient and/or caregiver: Patient unable to participate in discharge planning and no caregivers available . Please reorder OT as appropriate and removal of paralytics.   GO     Jeri Modena 05/09/2019, 9:57 AM
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
## 2019-05-10 ENCOUNTER — Inpatient Hospital Stay (HOSPITAL_COMMUNITY): Payer: BC Managed Care – PPO

## 2019-05-10 DIAGNOSIS — J80 Acute respiratory distress syndrome: Secondary | ICD-10-CM | POA: Diagnosis not present

## 2019-05-10 DIAGNOSIS — S8251XA Displaced fracture of medial malleolus of right tibia, initial encounter for closed fracture: Secondary | ICD-10-CM | POA: Diagnosis not present

## 2019-05-10 LAB — CBC
HCT: 28 % — ABNORMAL LOW (ref 39.0–52.0)
Hemoglobin: 8.3 g/dL — ABNORMAL LOW (ref 13.0–17.0)
MCH: 30.2 pg (ref 26.0–34.0)
MCHC: 29.6 g/dL — ABNORMAL LOW (ref 30.0–36.0)
MCV: 101.8 fL — ABNORMAL HIGH (ref 80.0–100.0)
Platelets: 603 10*3/uL — ABNORMAL HIGH (ref 150–400)
RBC: 2.75 MIL/uL — ABNORMAL LOW (ref 4.22–5.81)
RDW: 14.7 % (ref 11.5–15.5)
WBC: 18.2 10*3/uL — ABNORMAL HIGH (ref 4.0–10.5)
nRBC: 0 % (ref 0.0–0.2)

## 2019-05-10 LAB — BASIC METABOLIC PANEL
Anion gap: 10 (ref 5–15)
BUN: 24 mg/dL — ABNORMAL HIGH (ref 6–20)
CO2: 36 mmol/L — ABNORMAL HIGH (ref 22–32)
Calcium: 8.5 mg/dL — ABNORMAL LOW (ref 8.9–10.3)
Chloride: 100 mmol/L (ref 98–111)
Creatinine, Ser: 0.45 mg/dL — ABNORMAL LOW (ref 0.61–1.24)
GFR calc Af Amer: >60 mL/min (ref >60)
GFR calc non Af Amer: >60 mL/min (ref >60)
Glucose, Bld: 133 mg/dL — ABNORMAL HIGH (ref 70–99)
Potassium: 4.2 mmol/L (ref 3.5–5.1)
Sodium: 146 mmol/L — ABNORMAL HIGH (ref 135–145)

## 2019-05-10 LAB — GLUCOSE, CAPILLARY
Glucose-Capillary: 109 mg/dL — ABNORMAL HIGH (ref 70–99)
Glucose-Capillary: 114 mg/dL — ABNORMAL HIGH (ref 70–99)
Glucose-Capillary: 121 mg/dL — ABNORMAL HIGH (ref 70–99)
Glucose-Capillary: 125 mg/dL — ABNORMAL HIGH (ref 70–99)
Glucose-Capillary: 126 mg/dL — ABNORMAL HIGH (ref 70–99)
Glucose-Capillary: 130 mg/dL — ABNORMAL HIGH (ref 70–99)

## 2019-05-10 LAB — HEPATIC FUNCTION PANEL
ALT: 35 U/L (ref 0–44)
AST: 34 U/L (ref 15–41)
Albumin: 1.8 g/dL — ABNORMAL LOW (ref 3.5–5.0)
Alkaline Phosphatase: 121 U/L (ref 38–126)
Bilirubin, Direct: <0.1 mg/dL (ref 0.0–0.2)
Total Bilirubin: 0.1 mg/dL — ABNORMAL LOW (ref 0.3–1.2)
Total Protein: 6.5 g/dL (ref 6.5–8.1)

## 2019-05-10 LAB — PROTIME-INR
INR: 1.2 (ref 0.8–1.2)
Prothrombin Time: 14.6 s (ref 11.4–15.2)

## 2019-05-10 LAB — MAGNESIUM: Magnesium: 2.2 mg/dL (ref 1.7–2.4)

## 2019-05-10 LAB — PHOSPHORUS: Phosphorus: 3.7 mg/dL (ref 2.5–4.6)

## 2019-05-10 LAB — TRIGLYCERIDES: Triglycerides: 269 mg/dL — ABNORMAL HIGH (ref <150)

## 2019-05-10 MED ORDER — WARFARIN SODIUM 10 MG PO TABS
10.0000 mg | ORAL_TABLET | Freq: Once | ORAL | Status: AC
  Administered 2019-05-10: 10 mg
  Filled 2019-05-10: qty 1

## 2019-05-10 MED ORDER — ACETAZOLAMIDE SODIUM 500 MG IJ SOLR
250.0000 mg | Freq: Two times a day (BID) | INTRAMUSCULAR | Status: AC
  Administered 2019-05-10 (×2): 250 mg via INTRAVENOUS
  Filled 2019-05-10 (×3): qty 250

## 2019-05-10 MED ORDER — METOPROLOL TARTRATE 25 MG/10 ML ORAL SUSPENSION
67.5000 mg | Freq: Two times a day (BID) | ORAL | Status: DC
  Administered 2019-05-10 – 2019-05-17 (×16): 67.5 mg
  Filled 2019-05-10 (×15): qty 30

## 2019-05-10 MED ORDER — FUROSEMIDE 10 MG/ML IJ SOLN
40.0000 mg | Freq: Two times a day (BID) | INTRAMUSCULAR | Status: AC
  Administered 2019-05-10 (×2): 40 mg via INTRAVENOUS
  Filled 2019-05-10 (×2): qty 4
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
## 2019-05-10 NOTE — Progress Notes (Signed)
RT note-recruitment maneuver performed for sp02 88%, Fio2 attempted to wean, remains at 50%.

## 2019-05-10 NOTE — Progress Notes (Signed)
Critical ABG results given to RN. Gas notified.

## 2019-05-10 NOTE — Plan of Care (Signed)
  Problem: Clinical Measurements: Goal: Ability to maintain clinical measurements within normal limits will improve Outcome: Progressing Goal: Respiratory complications will improve Outcome: Progressing Goal: Cardiovascular complication will be avoided Outcome: Progressing   Problem: Coping: Goal: Level of anxiety will decrease Outcome: Progressing   Problem: Safety: Goal: Ability to remain free from injury will improve Outcome: Progressing   Problem: Skin Integrity: Goal: Risk for impaired skin integrity will decrease Outcome: Progressing   Problem: Activity: Goal: Risk for activity intolerance will decrease Outcome: Not Progressing

## 2019-05-10 NOTE — Progress Notes (Signed)
eLink Physician-Brief Progress Note Patient Name: Luke Choi DOB: 12-15-1970 MRN: [id number]   Date of Service  05/10/2019  HPI/Events of Note  Notified of ABG result 7.29/80/100 On PRVC 35/430/60%/14 PEEP Plateau pressures high 20s  eICU Interventions  Slight increase in PCO2, will allow permissive hypercapnea for now as not much room for vent adjustment Decrease in pH could be due to acetazolamide     Intervention Category Major Interventions: Acid-Base disturbance - evaluation and management  Shona Needles Crimson Dubberly 05/10/2019, 5:02 AM
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
## 2019-05-11 ENCOUNTER — Inpatient Hospital Stay (HOSPITAL_COMMUNITY): Payer: BC Managed Care – PPO

## 2019-05-11 DIAGNOSIS — S8251XA Displaced fracture of medial malleolus of right tibia, initial encounter for closed fracture: Secondary | ICD-10-CM | POA: Diagnosis not present

## 2019-05-11 DIAGNOSIS — J80 Acute respiratory distress syndrome: Secondary | ICD-10-CM | POA: Diagnosis not present

## 2019-05-11 LAB — CBC
HCT: 26.8 % — ABNORMAL LOW (ref 39.0–52.0)
Hemoglobin: 8 g/dL — ABNORMAL LOW (ref 13.0–17.0)
MCH: 29.5 pg (ref 26.0–34.0)
MCHC: 29.9 g/dL — ABNORMAL LOW (ref 30.0–36.0)
MCV: 98.9 fL (ref 80.0–100.0)
Platelets: 608 10*3/uL — ABNORMAL HIGH (ref 150–400)
RBC: 2.71 MIL/uL — ABNORMAL LOW (ref 4.22–5.81)
RDW: 14.4 % (ref 11.5–15.5)
WBC: 15.5 10*3/uL — ABNORMAL HIGH (ref 4.0–10.5)
nRBC: 0 % (ref 0.0–0.2)

## 2019-05-11 LAB — TRIGLYCERIDES: Triglycerides: 227 mg/dL — ABNORMAL HIGH (ref <150)

## 2019-05-11 LAB — BASIC METABOLIC PANEL
Anion gap: 8 (ref 5–15)
BUN: 24 mg/dL — ABNORMAL HIGH (ref 6–20)
CO2: 33 mmol/L — ABNORMAL HIGH (ref 22–32)
Calcium: 8.1 mg/dL — ABNORMAL LOW (ref 8.9–10.3)
Chloride: 98 mmol/L (ref 98–111)
Creatinine, Ser: 0.44 mg/dL — ABNORMAL LOW (ref 0.61–1.24)
GFR calc Af Amer: >60 mL/min (ref >60)
GFR calc non Af Amer: >60 mL/min (ref >60)
Glucose, Bld: 122 mg/dL — ABNORMAL HIGH (ref 70–99)
Potassium: 3.2 mmol/L — ABNORMAL LOW (ref 3.5–5.1)
Sodium: 139 mmol/L (ref 135–145)

## 2019-05-11 LAB — GLUCOSE, CAPILLARY
Glucose-Capillary: 115 mg/dL — ABNORMAL HIGH (ref 70–99)
Glucose-Capillary: 115 mg/dL — ABNORMAL HIGH (ref 70–99)
Glucose-Capillary: 119 mg/dL — ABNORMAL HIGH (ref 70–99)
Glucose-Capillary: 124 mg/dL — ABNORMAL HIGH (ref 70–99)
Glucose-Capillary: 130 mg/dL — ABNORMAL HIGH (ref 70–99)
Glucose-Capillary: 135 mg/dL — ABNORMAL HIGH (ref 70–99)

## 2019-05-11 LAB — MAGNESIUM: Magnesium: 2.2 mg/dL (ref 1.7–2.4)

## 2019-05-11 LAB — PROTIME-INR
INR: 1.2 (ref 0.8–1.2)
Prothrombin Time: 14.9 s (ref 11.4–15.2)

## 2019-05-11 MED ORDER — WARFARIN SODIUM 2.5 MG PO TABS
12.5000 mg | ORAL_TABLET | Freq: Once | ORAL | Status: AC
  Administered 2019-05-11: 12.5 mg via ORAL
  Filled 2019-05-11: qty 1

## 2019-05-11 MED ORDER — POTASSIUM CHLORIDE 20 MEQ/15ML (10%) PO SOLN: 40.0000 meq | Freq: Two times a day (BID) | ORAL | Status: DC

## 2019-05-11 MED ORDER — FUROSEMIDE 10 MG/ML IJ SOLN
40.0000 mg | Freq: Three times a day (TID) | INTRAMUSCULAR | Status: AC
  Administered 2019-05-11 (×3): 40 mg via INTRAVENOUS
  Filled 2019-05-11 (×3): qty 4

## 2019-05-11 MED ORDER — FREE WATER
300.0000 mL | Freq: Four times a day (QID) | Status: DC
  Administered 2019-05-11 – 2019-05-20 (×36): 300 mL

## 2019-05-11 MED ORDER — POTASSIUM CHLORIDE 20 MEQ PO PACK
40.0000 meq | PACK | ORAL | Status: AC
  Administered 2019-05-11 (×3): 40 meq
  Filled 2019-05-11 (×3): qty 2
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
## 2019-05-11 NOTE — Progress Notes (Signed)
Dr. Lamonte Sakai wants to start titrating down Propofol, but keep Versed and Fentanyl.  If pt tolerates this, MD wants to start decreasing Versed.  So will not be doing a true WUA today.
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
## 2019-05-12 ENCOUNTER — Inpatient Hospital Stay (HOSPITAL_COMMUNITY): Payer: BC Managed Care – PPO

## 2019-05-12 ENCOUNTER — Other Ambulatory Visit: Payer: Self-pay

## 2019-05-12 ENCOUNTER — Encounter (HOSPITAL_COMMUNITY): Payer: Self-pay

## 2019-05-12 DIAGNOSIS — S8251XA Displaced fracture of medial malleolus of right tibia, initial encounter for closed fracture: Secondary | ICD-10-CM | POA: Diagnosis not present

## 2019-05-12 LAB — BLOOD GAS, ARTERIAL
Acid-Base Excess: 12.3 mmol/L — ABNORMAL HIGH (ref 0.0–2.0)
Bicarbonate: 37.2 mmol/L — ABNORMAL HIGH (ref 20.0–28.0)
Drawn by: 44135
FIO2: 60
MECHVT: 430 mL
O2 Saturation: 98.9 %
PEEP: 10 cmH2O
Patient temperature: 100
RATE: 35 resp/min
pCO2 arterial: 57.9 mmHg — ABNORMAL HIGH (ref 32.0–48.0)
pH, Arterial: 7.428 (ref 7.350–7.450)
pO2, Arterial: 131 mmHg — ABNORMAL HIGH (ref 83.0–108.0)

## 2019-05-12 LAB — MAGNESIUM: Magnesium: 2.1 mg/dL (ref 1.7–2.4)

## 2019-05-12 LAB — BASIC METABOLIC PANEL
Anion gap: 8 (ref 5–15)
BUN: 25 mg/dL — ABNORMAL HIGH (ref 6–20)
CO2: 34 mmol/L — ABNORMAL HIGH (ref 22–32)
Calcium: 8.1 mg/dL — ABNORMAL LOW (ref 8.9–10.3)
Chloride: 99 mmol/L (ref 98–111)
Creatinine, Ser: 0.37 mg/dL — ABNORMAL LOW (ref 0.61–1.24)
GFR calc Af Amer: >60 mL/min (ref >60)
GFR calc non Af Amer: >60 mL/min (ref >60)
Glucose, Bld: 128 mg/dL — ABNORMAL HIGH (ref 70–99)
Potassium: 3.1 mmol/L — ABNORMAL LOW (ref 3.5–5.1)
Sodium: 141 mmol/L (ref 135–145)

## 2019-05-12 LAB — CBC
HCT: 26.8 % — ABNORMAL LOW (ref 39.0–52.0)
Hemoglobin: 7.9 g/dL — ABNORMAL LOW (ref 13.0–17.0)
MCH: 29 pg (ref 26.0–34.0)
MCHC: 29.5 g/dL — ABNORMAL LOW (ref 30.0–36.0)
MCV: 98.5 fL (ref 80.0–100.0)
Platelets: 611 10*3/uL — ABNORMAL HIGH (ref 150–400)
RBC: 2.72 MIL/uL — ABNORMAL LOW (ref 4.22–5.81)
RDW: 14.6 % (ref 11.5–15.5)
WBC: 13.8 10*3/uL — ABNORMAL HIGH (ref 4.0–10.5)
nRBC: 0 % (ref 0.0–0.2)

## 2019-05-12 LAB — GLUCOSE, CAPILLARY
Glucose-Capillary: 114 mg/dL — ABNORMAL HIGH (ref 70–99)
Glucose-Capillary: 116 mg/dL — ABNORMAL HIGH (ref 70–99)
Glucose-Capillary: 120 mg/dL — ABNORMAL HIGH (ref 70–99)
Glucose-Capillary: 126 mg/dL — ABNORMAL HIGH (ref 70–99)
Glucose-Capillary: 127 mg/dL — ABNORMAL HIGH (ref 70–99)
Glucose-Capillary: 140 mg/dL — ABNORMAL HIGH (ref 70–99)

## 2019-05-12 LAB — PROTIME-INR
INR: 1.2 (ref 0.8–1.2)
Prothrombin Time: 15.4 s — ABNORMAL HIGH (ref 11.4–15.2)

## 2019-05-12 LAB — TRIGLYCERIDES: Triglycerides: 209 mg/dL — ABNORMAL HIGH (ref <150)

## 2019-05-12 MED ORDER — WARFARIN SODIUM 7.5 MG PO TABS
15.0000 mg | ORAL_TABLET | Freq: Once | ORAL | Status: DC
  Filled 2019-05-12: qty 2

## 2019-05-12 MED ORDER — FUROSEMIDE 10 MG/ML IJ SOLN
40.0000 mg | Freq: Three times a day (TID) | INTRAMUSCULAR | Status: AC
  Administered 2019-05-12 (×3): 40 mg via INTRAVENOUS
  Filled 2019-05-12 (×3): qty 4

## 2019-05-12 MED ORDER — WARFARIN SODIUM 7.5 MG PO TABS
15.0000 mg | ORAL_TABLET | Freq: Once | ORAL | Status: AC
  Administered 2019-05-12: 18:00:00 15 mg
  Filled 2019-05-12: qty 2

## 2019-05-12 MED ORDER — POTASSIUM CHLORIDE 20 MEQ PO PACK
40.0000 meq | PACK | ORAL | Status: AC
  Administered 2019-05-12 (×3): 40 meq
  Filled 2019-05-12 (×3): qty 2
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
## 2019-05-12 NOTE — Progress Notes (Signed)
Preoperative Dx: Left lower extremity wound  Postoperative Dx: Same  Procedure: Placement of Acell Sheet/Powder and wound vac  Provider: Roetta Sessions, PA-C  Anesthesia: Patient already sedated on Fentanyl, versed, propofol in ICU bed.  Indication for Procedure: Left lower extremity wound 6 x 4 cm wound.  Description of Procedure:  Risks and complications were explained to the patient's family member/guardian.  Consent was confirmed. Time out was called and all information was confirmed to be correct. Procedure was performed at the bedside in ICU. 1 gram of Acell was applied directly to the wound bed, followed by a 5x5 Acell sheet. Sorbact was then placed, followed KY jelly and a wound vac sponge cut to size. Duoderm was then applied to the peri-wound area. The wound vac was applied and a seal was confirmed. Left leg was wrapped with kerlix and ACE bandages .  He tolerated the procedure well and there were no complications.

## 2019-05-12 NOTE — Progress Notes (Signed)
1 week from placement of wound vac and Acell sheet/powder Subjective: Wound vac in place, no drainage in canister. Patient is ventilated, NG tube in place.   Objective: Vital signs in last 24 hours: Temp:  [100 F (37.8 C)-101.5 F (38.6 C)] 100.4 F (38 C) (10/01 1100) Pulse Rate:  [98-127] 106 (10/01 1100) Resp:  [9-40] 24 (10/01 1100) BP: (103-130)/(51-74) 111/64 (10/01 1100) SpO2:  [92 %-96 %] 94 % (10/01 1100) Arterial Line BP: (119-146)/(47-63) 125/55 (10/01 1100) FiO2 (%):  [60 %] 60 % (10/01 1149) Weight:  [86.1 kg] 86.1 kg (10/01 0500) Last BM Date: 05/11/19  Intake/Output from previous day: 09/30 0701 - 10/01 0700 In: 3883.3 [I.V.:1598.3; DD/UK:0254; IV Piggyback:100] Out: 4700 [Urine:4050; Emesis/NG output:650] Intake/Output this shift: Total I/O In: 210.3 [I.V.:140.3; NG/GT:70] Out: 200 [Urine:200]  General appearance: ill appearing, on vent Extremities: Left leg incision with sutures in place. Wound vac removed, granulation tissue, incorporating Acell. Mild exudate. Surrounding skin non-erythematous, non-infected, no drainage noted. Minimal bleeding. Wound is 5 x 2 cm.  Pulses: left LE with 2+ pulse, good capillary refill and color  Lab Results:  CBC    Component Value Date/Time   WBC 13.8 (H) 05/12/2019 0451   RBC 2.72 (L) 05/12/2019 0451   HGB 7.9 (L) 05/12/2019 0451   HCT 26.8 (L) 05/12/2019 0451   PLT 611 (H) 05/12/2019 0451   MCV 98.5 05/12/2019 0451   MCH 29.0 05/12/2019 0451   MCHC 29.5 (L) 05/12/2019 0451   RDW 14.6 05/12/2019 0451    BMET Recent Labs    05/11/19 0528 05/12/19 0451  NA 139 141  K 3.2* 3.1*  CL 98 99  CO2 33* 34*  GLUCOSE 122* 128*  BUN 24* 25*  CREATININE 0.44* 0.37*  CALCIUM 8.1* 8.1*   PT/INR Recent Labs    05/11/19 0528 05/12/19 0451  LABPROT 14.9 15.4*  INR 1.2 1.2   ABG Recent Labs    05/10/19 0410 05/12/19 0342  PHART 7.292* 7.428  HCO3 37.1* 37.2*    Studies/Results: Dg Chest Port 1  View  Result Date: 05/12/2019 CLINICAL DATA:  Acute respiratory failure EXAM: PORTABLE CHEST 1 VIEW COMPARISON:  05/11/2019 FINDINGS: Endotracheal tube with the tip 4.2 cm above the carina. Feeding tube coursing below the diaphragm. Left subclavian central venous catheter with the tip projecting over the SVC. Bilateral interstitial and patchy alveolar airspace opacities. No definite pleural effusion. No pneumothorax. Normal cardiomediastinal silhouette. No aggressive osseous lesion. IMPRESSION: 1. Support lines and tubing in satisfactory position. 2. Bilateral interstitial and patchy alveolar airspace opacities primarily in the lower lobes likely reflecting pneumonia. Electronically Signed   By: Kathreen Devoid   On: 05/12/2019 08:15   Dg Chest Port 1 View  Result Date: 05/11/2019 CLINICAL DATA:  Acute respiratory failure with hypoxia. EXAM: PORTABLE CHEST 1 VIEW COMPARISON:  05/10/2019 FINDINGS: Endotracheal tube is 3.6 cm above the carina. Feeding tube extends in the abdomen. Left subclavian central line tip is near the upper SVC and left innominate junction. Patchy and hazy airspace densities in mid and lower lungs are unchanged. Stable consolidation in the retrocardiac space. Negative for a large pneumothorax. Heart size within normal limits. IMPRESSION: 1. No change in the airspace densities in the mid and lower lungs bilaterally. 2. Visualized support apparatuses are appropriately positioned. Electronically Signed   By: Markus Daft M.D.   On: 05/11/2019 08:11    Anti-infectives: Anti-infectives (From admission, onward)   Start     Dose/Rate Route Frequency Ordered Stop  05/05/19 1400  cefTRIAXone (ROCEPHIN) 2 g in sodium chloride 0.9 % 100 mL IVPB     2 g 200 mL/hr over 30 Minutes Intravenous Every 24 hours 05/05/19 0814 05/11/19 1357   05/04/19 0600  ceFAZolin (ANCEF) IVPB 2g/100 mL premix     2 g 200 mL/hr over 30 Minutes Intravenous On call to O.R. 05/03/19 1202 05/05/19 0559   05/01/19 1630   vancomycin (VANCOCIN) 2,000 mg in sodium chloride 0.9 % 500 mL IVPB  Status:  Discontinued     2,000 mg 250 mL/hr over 120 Minutes Intravenous Every 12 hours 05/01/19 1027 05/05/19 0814   04/29/19 0430  vancomycin (VANCOCIN) 1,250 mg in sodium chloride 0.9 % 250 mL IVPB  Status:  Discontinued     1,250 mg 166.7 mL/hr over 90 Minutes Intravenous Every 12 hours 04/28/19 1600 05/01/19 1027   04/28/19 1630  vancomycin (VANCOCIN) 1,250 mg in sodium chloride 0.9 % 250 mL IVPB  Status:  Discontinued     1,250 mg 166.7 mL/hr over 90 Minutes Intravenous Every 12 hours 04/28/19 1550 04/28/19 1600   04/28/19 1630  vancomycin (VANCOCIN) 2,250 mg in sodium chloride 0.9 % 500 mL IVPB     2,250 mg 250 mL/hr over 120 Minutes Intravenous  Once 04/28/19 1600 04/28/19 1916   04/26/19 1600  ceFEPIme (MAXIPIME) 2 g in sodium chloride 0.9 % 100 mL IVPB  Status:  Discontinued     2 g 200 mL/hr over 30 Minutes Intravenous Every 8 hours 04/26/19 1556 05/05/19 0814   04/22/19 0945  ceFAZolin (ANCEF) IVPB 2g/100 mL premix    Note to Pharmacy: Anesthesia to give preop   2 g 200 mL/hr over 30 Minutes Intravenous  Once 04/22/19 0930 04/22/19 1245   04/22/19 0945  ceFAZolin (ANCEF) IVPB 2g/100 mL premix  Status:  Discontinued     2 g 200 mL/hr over 30 Minutes Intravenous Every 8 hours 04/22/19 0930 04/22/19 1330   04/20/19 2130  penicillin G potassium 2 Million Units in dextrose 5 % 50 mL IVPB     2 Million Units 100 mL/hr over 30 Minutes Intravenous STAT 04/20/19 2104 04/20/19 2216   04/20/19 2130  gentamicin (GARAMYCIN) 400 mg in dextrose 5 % 50 mL IVPB     5 mg/kg  79.8 kg 120 mL/hr over 30 Minutes Intravenous STAT 04/20/19 2115 04/20/19 2228   04/20/19 1900  ceFAZolin (ANCEF) IVPB 2g/100 mL premix     2 g 200 mL/hr over 30 Minutes Intravenous  Once 04/20/19 1851 04/20/19 2036      Assessment/Plan: s/p Procedure  Wound vac removed, incorporating Acell.  Wound care nurse to apply wound vac 10/2 or 10/3.  Until then, daily adaptic with KY jelly, 4x4, kerlix, ace wrap from toes to mid calf.   Call with questions or concerns.  Pictures were obtained of the patient and placed in the chart with the patient's or guardian's permission.    LOS: 22 days    Leslee Home, PA-C 05/12/2019
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
## 2019-05-13 ENCOUNTER — Inpatient Hospital Stay (HOSPITAL_COMMUNITY): Payer: BC Managed Care – PPO

## 2019-05-13 DIAGNOSIS — J9600 Acute respiratory failure, unspecified whether with hypoxia or hypercapnia: Secondary | ICD-10-CM

## 2019-05-13 LAB — PROTIME-INR
INR: 1.5 — ABNORMAL HIGH (ref 0.8–1.2)
Prothrombin Time: 17.9 s — ABNORMAL HIGH (ref 11.4–15.2)

## 2019-05-13 LAB — POCT I-STAT 7, (LYTES, BLD GAS, ICA,H+H)
Acid-Base Excess: 17 mmol/L — ABNORMAL HIGH (ref 0.0–2.0)
Acid-Base Excess: 16 mmol/L — ABNORMAL HIGH (ref 0.0–2.0)
Bicarbonate: 41.9 mmol/L — ABNORMAL HIGH (ref 20.0–28.0)
Bicarbonate: 42.4 mmol/L — ABNORMAL HIGH (ref 20.0–28.0)
Calcium, Ion: 1.16 mmol/L (ref 1.15–1.40)
Calcium, Ion: 1.17 mmol/L (ref 1.15–1.40)
HCT: 27 % — ABNORMAL LOW (ref 39.0–52.0)
HCT: 39 % (ref 39.0–52.0)
Hemoglobin: 9.2 g/dL — ABNORMAL LOW (ref 13.0–17.0)
Hemoglobin: 13.3 g/dL (ref 13.0–17.0)
O2 Saturation: 94 %
O2 Saturation: 99 %
Patient temperature: 99.4
Patient temperature: 99
Potassium: 3.7 mmol/L (ref 3.5–5.1)
Potassium: 3.8 mmol/L (ref 3.5–5.1)
Sodium: 144 mmol/L (ref 135–145)
Sodium: 144 mmol/L (ref 135–145)
TCO2: 43 mmol/L — ABNORMAL HIGH (ref 22–32)
TCO2: 44 mmol/L — ABNORMAL HIGH (ref 22–32)
pCO2 arterial: 55.9 mmHg — ABNORMAL HIGH (ref 32.0–48.0)
pCO2 arterial: 55.6 mmHg — ABNORMAL HIGH (ref 32.0–48.0)
pH, Arterial: 7.484 — ABNORMAL HIGH (ref 7.350–7.450)
pH, Arterial: 7.491 — ABNORMAL HIGH (ref 7.350–7.450)
pO2, Arterial: 71 mmHg — ABNORMAL LOW (ref 83.0–108.0)
pO2, Arterial: 110 mmHg — ABNORMAL HIGH (ref 83.0–108.0)

## 2019-05-13 LAB — CBC
HCT: 28.4 % — ABNORMAL LOW (ref 39.0–52.0)
Hemoglobin: 8.2 g/dL — ABNORMAL LOW (ref 13.0–17.0)
MCH: 28.6 pg (ref 26.0–34.0)
MCHC: 28.9 g/dL — ABNORMAL LOW (ref 30.0–36.0)
MCV: 99 fL (ref 80.0–100.0)
Platelets: 637 10*3/uL — ABNORMAL HIGH (ref 150–400)
RBC: 2.87 MIL/uL — ABNORMAL LOW (ref 4.22–5.81)
RDW: 14.7 % (ref 11.5–15.5)
WBC: 18.8 10*3/uL — ABNORMAL HIGH (ref 4.0–10.5)
nRBC: 0 % (ref 0.0–0.2)

## 2019-05-13 LAB — BASIC METABOLIC PANEL
Anion gap: 11 (ref 5–15)
BUN: 26 mg/dL — ABNORMAL HIGH (ref 6–20)
CO2: 36 mmol/L — ABNORMAL HIGH (ref 22–32)
Calcium: 8.4 mg/dL — ABNORMAL LOW (ref 8.9–10.3)
Chloride: 98 mmol/L (ref 98–111)
Creatinine, Ser: 0.44 mg/dL — ABNORMAL LOW (ref 0.61–1.24)
GFR calc Af Amer: >60 mL/min (ref >60)
GFR calc non Af Amer: >60 mL/min (ref >60)
Glucose, Bld: 134 mg/dL — ABNORMAL HIGH (ref 70–99)
Potassium: 3.5 mmol/L (ref 3.5–5.1)
Sodium: 145 mmol/L (ref 135–145)

## 2019-05-13 LAB — PHOSPHORUS: Phosphorus: 3.5 mg/dL (ref 2.5–4.6)

## 2019-05-13 LAB — TRIGLYCERIDES: Triglycerides: 212 mg/dL — ABNORMAL HIGH (ref <150)

## 2019-05-13 LAB — GLUCOSE, CAPILLARY
Glucose-Capillary: 122 mg/dL — ABNORMAL HIGH (ref 70–99)
Glucose-Capillary: 101 mg/dL — ABNORMAL HIGH (ref 70–99)
Glucose-Capillary: 140 mg/dL — ABNORMAL HIGH (ref 70–99)
Glucose-Capillary: 121 mg/dL — ABNORMAL HIGH (ref 70–99)
Glucose-Capillary: 122 mg/dL — ABNORMAL HIGH (ref 70–99)
Glucose-Capillary: 119 mg/dL — ABNORMAL HIGH (ref 70–99)

## 2019-05-13 LAB — MAGNESIUM: Magnesium: 2.2 mg/dL (ref 1.7–2.4)

## 2019-05-13 MED ORDER — WARFARIN SODIUM 7.5 MG PO TABS
15.0000 mg | ORAL_TABLET | Freq: Once | ORAL | Status: AC
  Administered 2019-05-13: 15 mg
  Filled 2019-05-13: qty 2

## 2019-05-13 MED ORDER — POTASSIUM CHLORIDE 20 MEQ/15ML (10%) PO SOLN
40.0000 meq | Freq: Two times a day (BID) | ORAL | Status: AC
  Administered 2019-05-13: 40 meq via ORAL
  Filled 2019-05-13 (×3): qty 30

## 2019-05-13 MED ORDER — POTASSIUM CHLORIDE 20 MEQ/15ML (10%) PO SOLN
40.0000 meq | Freq: Two times a day (BID) | ORAL | Status: DC
  Administered 2019-05-13: 22:00:00 40 meq
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
## 2019-05-14 ENCOUNTER — Inpatient Hospital Stay (HOSPITAL_COMMUNITY): Payer: BC Managed Care – PPO

## 2019-05-14 DIAGNOSIS — G9340 Encephalopathy, unspecified: Secondary | ICD-10-CM

## 2019-05-14 DIAGNOSIS — U071 COVID-19: Secondary | ICD-10-CM

## 2019-05-14 DIAGNOSIS — J9601 Acute respiratory failure with hypoxia: Secondary | ICD-10-CM | POA: Diagnosis not present

## 2019-05-14 DIAGNOSIS — S8251XA Displaced fracture of medial malleolus of right tibia, initial encounter for closed fracture: Secondary | ICD-10-CM | POA: Diagnosis not present

## 2019-05-14 LAB — COMPREHENSIVE METABOLIC PANEL
ALT: 39 U/L (ref 0–44)
AST: 45 U/L — ABNORMAL HIGH (ref 15–41)
Albumin: 1.7 g/dL — ABNORMAL LOW (ref 3.5–5.0)
Alkaline Phosphatase: 121 U/L (ref 38–126)
Anion gap: 9 (ref 5–15)
BUN: 26 mg/dL — ABNORMAL HIGH (ref 6–20)
CO2: 35 mmol/L — ABNORMAL HIGH (ref 22–32)
Calcium: 8.4 mg/dL — ABNORMAL LOW (ref 8.9–10.3)
Chloride: 101 mmol/L (ref 98–111)
Creatinine, Ser: 0.44 mg/dL — ABNORMAL LOW (ref 0.61–1.24)
GFR calc Af Amer: >60 mL/min (ref >60)
GFR calc non Af Amer: >60 mL/min (ref >60)
Glucose, Bld: 127 mg/dL — ABNORMAL HIGH (ref 70–99)
Potassium: 3.9 mmol/L (ref 3.5–5.1)
Sodium: 145 mmol/L (ref 135–145)
Total Bilirubin: 0.4 mg/dL (ref 0.3–1.2)
Total Protein: 6.5 g/dL (ref 6.5–8.1)

## 2019-05-14 LAB — GLUCOSE, CAPILLARY
Glucose-Capillary: 127 mg/dL — ABNORMAL HIGH (ref 70–99)
Glucose-Capillary: 122 mg/dL — ABNORMAL HIGH (ref 70–99)
Glucose-Capillary: 146 mg/dL — ABNORMAL HIGH (ref 70–99)
Glucose-Capillary: 159 mg/dL — ABNORMAL HIGH (ref 70–99)
Glucose-Capillary: 131 mg/dL — ABNORMAL HIGH (ref 70–99)
Glucose-Capillary: 137 mg/dL — ABNORMAL HIGH (ref 70–99)

## 2019-05-14 LAB — CBC
HCT: 26.1 % — ABNORMAL LOW (ref 39.0–52.0)
Hemoglobin: 7.7 g/dL — ABNORMAL LOW (ref 13.0–17.0)
MCH: 29.2 pg (ref 26.0–34.0)
MCHC: 29.5 g/dL — ABNORMAL LOW (ref 30.0–36.0)
MCV: 98.9 fL (ref 80.0–100.0)
Platelets: 591 10*3/uL — ABNORMAL HIGH (ref 150–400)
RBC: 2.64 MIL/uL — ABNORMAL LOW (ref 4.22–5.81)
RDW: 14.5 % (ref 11.5–15.5)
WBC: 13.2 10*3/uL — ABNORMAL HIGH (ref 4.0–10.5)
nRBC: 0 % (ref 0.0–0.2)

## 2019-05-14 LAB — POCT I-STAT 7, (LYTES, BLD GAS, ICA,H+H)
Acid-Base Excess: 13 mmol/L — ABNORMAL HIGH (ref 0.0–2.0)
Bicarbonate: 38.7 mmol/L — ABNORMAL HIGH (ref 20.0–28.0)
Calcium, Ion: 1.22 mmol/L (ref 1.15–1.40)
HCT: 25 % — ABNORMAL LOW (ref 39.0–52.0)
Hemoglobin: 8.5 g/dL — ABNORMAL LOW (ref 13.0–17.0)
O2 Saturation: 98 %
Patient temperature: 98.3
Potassium: 4 mmol/L (ref 3.5–5.1)
Sodium: 146 mmol/L — ABNORMAL HIGH (ref 135–145)
TCO2: 40 mmol/L — ABNORMAL HIGH (ref 22–32)
pCO2 arterial: 57.1 mmHg — ABNORMAL HIGH (ref 32.0–48.0)
pH, Arterial: 7.438 (ref 7.350–7.450)
pO2, Arterial: 106 mmHg (ref 83.0–108.0)

## 2019-05-14 LAB — PROTIME-INR
INR: 1.5 — ABNORMAL HIGH (ref 0.8–1.2)
Prothrombin Time: 18.2 s — ABNORMAL HIGH (ref 11.4–15.2)

## 2019-05-14 LAB — TRIGLYCERIDES: Triglycerides: 179 mg/dL — ABNORMAL HIGH (ref <150)

## 2019-05-14 LAB — PHOSPHORUS: Phosphorus: 2.8 mg/dL (ref 2.5–4.6)

## 2019-05-14 LAB — MAGNESIUM: Magnesium: 2.2 mg/dL (ref 1.7–2.4)

## 2019-05-14 MED ORDER — POTASSIUM CHLORIDE 20 MEQ/15ML (10%) PO SOLN
40.0000 meq | Freq: Three times a day (TID) | ORAL | Status: AC
  Administered 2019-05-14 (×2): 40 meq
  Filled 2019-05-14 (×2): qty 30

## 2019-05-14 MED ORDER — FUROSEMIDE 10 MG/ML IJ SOLN
40.0000 mg | Freq: Three times a day (TID) | INTRAMUSCULAR | Status: AC
  Administered 2019-05-14 (×2): 40 mg via INTRAVENOUS
  Filled 2019-05-14 (×2): qty 4

## 2019-05-14 MED ORDER — WARFARIN SODIUM 7.5 MG PO TABS
17.5000 mg | ORAL_TABLET | Freq: Once | ORAL | Status: AC
  Administered 2019-05-14: 17.5 mg
  Filled 2019-05-14: qty 1

## 2019-05-14 MED ORDER — METOLAZONE 5 MG PO TABS
10.0000 mg | ORAL_TABLET | Freq: Once | ORAL | Status: AC
  Administered 2019-05-14: 11:00:00 10 mg via ORAL
  Filled 2019-05-14: qty 1
  Filled 2019-05-14: qty 2

## 2019-05-14 MED ORDER — DEXMEDETOMIDINE HCL IN NACL 400 MCG/100ML IV SOLN
0.4000 ug/kg/h | INTRAVENOUS | Status: DC
  Administered 2019-05-14: 21:00:00 1.2 ug/kg/h via INTRAVENOUS
  Administered 2019-05-14: 0.9 ug/kg/h via INTRAVENOUS
  Administered 2019-05-14 (×2): 1 ug/kg/h via INTRAVENOUS
  Administered 2019-05-15 (×5): 1.2 ug/kg/h via INTRAVENOUS
  Administered 2019-05-16: 0.8 ug/kg/h via INTRAVENOUS
  Administered 2019-05-16: 03:00:00 1.2 ug/kg/h via INTRAVENOUS
  Administered 2019-05-16: 16:00:00 0.8 ug/kg/h via INTRAVENOUS
  Administered 2019-05-16: 10:00:00 1.1 ug/kg/h via INTRAVENOUS
  Administered 2019-05-16 (×2): 1.2 ug/kg/h via INTRAVENOUS
  Administered 2019-05-17: 11:00:00 1 ug/kg/h via INTRAVENOUS
  Administered 2019-05-17: 0.8 ug/kg/h via INTRAVENOUS
  Administered 2019-05-17 (×2): 1.2 ug/kg/h via INTRAVENOUS
  Administered 2019-05-17: 1 ug/kg/h via INTRAVENOUS
  Administered 2019-05-18 – 2019-05-19 (×7): 1.2 ug/kg/h via INTRAVENOUS
  Administered 2019-05-19 (×3): 1 ug/kg/h via INTRAVENOUS
  Administered 2019-05-19: 1.2 ug/kg/h via INTRAVENOUS
  Administered 2019-05-19: 1 ug/kg/h via INTRAVENOUS
  Administered 2019-05-20 (×2): 1.2 ug/kg/h via INTRAVENOUS
  Administered 2019-05-20: 1 ug/kg/h via INTRAVENOUS
  Administered 2019-05-20 – 2019-05-21 (×4): 1.2 ug/kg/h via INTRAVENOUS
  Filled 2019-05-14 (×39): qty 100
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
## 2019-05-15 ENCOUNTER — Inpatient Hospital Stay (HOSPITAL_COMMUNITY): Payer: BC Managed Care – PPO

## 2019-05-15 DIAGNOSIS — J9601 Acute respiratory failure with hypoxia: Secondary | ICD-10-CM | POA: Diagnosis not present

## 2019-05-15 DIAGNOSIS — R0902 Hypoxemia: Secondary | ICD-10-CM

## 2019-05-15 DIAGNOSIS — J80 Acute respiratory distress syndrome: Secondary | ICD-10-CM | POA: Diagnosis not present

## 2019-05-15 LAB — POCT I-STAT 7, (LYTES, BLD GAS, ICA,H+H)
Acid-Base Excess: 13 mmol/L — ABNORMAL HIGH (ref 0.0–2.0)
Acid-Base Excess: 13 mmol/L — ABNORMAL HIGH (ref 0.0–2.0)
Bicarbonate: 39.3 mmol/L — ABNORMAL HIGH (ref 20.0–28.0)
Bicarbonate: 38.1 mmol/L — ABNORMAL HIGH (ref 20.0–28.0)
Calcium, Ion: 1.14 mmol/L — ABNORMAL LOW (ref 1.15–1.40)
Calcium, Ion: 1.14 mmol/L — ABNORMAL LOW (ref 1.15–1.40)
HCT: 35 % — ABNORMAL LOW (ref 39.0–52.0)
HCT: 28 % — ABNORMAL LOW (ref 39.0–52.0)
Hemoglobin: 11.9 g/dL — ABNORMAL LOW (ref 13.0–17.0)
Hemoglobin: 9.5 g/dL — ABNORMAL LOW (ref 13.0–17.0)
O2 Saturation: 100 %
O2 Saturation: 94 %
Patient temperature: 101.4
Patient temperature: 100.5
Potassium: 3.2 mmol/L — ABNORMAL LOW (ref 3.5–5.1)
Potassium: 2.9 mmol/L — ABNORMAL LOW (ref 3.5–5.1)
Sodium: 146 mmol/L — ABNORMAL HIGH (ref 135–145)
Sodium: 145 mmol/L (ref 135–145)
TCO2: 41 mmol/L — ABNORMAL HIGH (ref 22–32)
TCO2: 40 mmol/L — ABNORMAL HIGH (ref 22–32)
pCO2 arterial: 60.3 mmHg — ABNORMAL HIGH (ref 32.0–48.0)
pCO2 arterial: 56.7 mmHg — ABNORMAL HIGH (ref 32.0–48.0)
pH, Arterial: 7.428 (ref 7.350–7.450)
pH, Arterial: 7.439 (ref 7.350–7.450)
pO2, Arterial: 189 mmHg — ABNORMAL HIGH (ref 83.0–108.0)
pO2, Arterial: 74 mmHg — ABNORMAL LOW (ref 83.0–108.0)

## 2019-05-15 LAB — MAGNESIUM: Magnesium: 2.1 mg/dL (ref 1.7–2.4)

## 2019-05-15 LAB — CBC
HCT: 26.9 % — ABNORMAL LOW (ref 39.0–52.0)
Hemoglobin: 8.3 g/dL — ABNORMAL LOW (ref 13.0–17.0)
MCH: 29.9 pg (ref 26.0–34.0)
MCHC: 30.9 g/dL (ref 30.0–36.0)
MCV: 96.8 fL (ref 80.0–100.0)
Platelets: 573 10*3/uL — ABNORMAL HIGH (ref 150–400)
RBC: 2.78 MIL/uL — ABNORMAL LOW (ref 4.22–5.81)
RDW: 14.4 % (ref 11.5–15.5)
WBC: 18.8 10*3/uL — ABNORMAL HIGH (ref 4.0–10.5)
nRBC: 0 % (ref 0.0–0.2)

## 2019-05-15 LAB — CULTURE, RESPIRATORY W GRAM STAIN: Special Requests: NORMAL

## 2019-05-15 LAB — GLUCOSE, CAPILLARY
Glucose-Capillary: 104 mg/dL — ABNORMAL HIGH (ref 70–99)
Glucose-Capillary: 124 mg/dL — ABNORMAL HIGH (ref 70–99)
Glucose-Capillary: 127 mg/dL — ABNORMAL HIGH (ref 70–99)
Glucose-Capillary: 132 mg/dL — ABNORMAL HIGH (ref 70–99)
Glucose-Capillary: 139 mg/dL — ABNORMAL HIGH (ref 70–99)
Glucose-Capillary: 141 mg/dL — ABNORMAL HIGH (ref 70–99)

## 2019-05-15 LAB — BASIC METABOLIC PANEL
Anion gap: 13 (ref 5–15)
BUN: 29 mg/dL — ABNORMAL HIGH (ref 6–20)
CO2: 32 mmol/L (ref 22–32)
Calcium: 8.1 mg/dL — ABNORMAL LOW (ref 8.9–10.3)
Chloride: 100 mmol/L (ref 98–111)
Creatinine, Ser: 0.48 mg/dL — ABNORMAL LOW (ref 0.61–1.24)
GFR calc Af Amer: >60 mL/min (ref >60)
GFR calc non Af Amer: >60 mL/min (ref >60)
Glucose, Bld: 144 mg/dL — ABNORMAL HIGH (ref 70–99)
Potassium: 3.2 mmol/L — ABNORMAL LOW (ref 3.5–5.1)
Sodium: 145 mmol/L (ref 135–145)

## 2019-05-15 LAB — PROCALCITONIN: Procalcitonin: 0.23 ng/mL

## 2019-05-15 LAB — PHOSPHORUS: Phosphorus: 3.8 mg/dL (ref 2.5–4.6)

## 2019-05-15 LAB — TRIGLYCERIDES: Triglycerides: 159 mg/dL — ABNORMAL HIGH (ref <150)

## 2019-05-15 LAB — PROTIME-INR
INR: 1.7 — ABNORMAL HIGH (ref 0.8–1.2)
Prothrombin Time: 19.9 s — ABNORMAL HIGH (ref 11.4–15.2)

## 2019-05-15 MED ORDER — POTASSIUM CHLORIDE 20 MEQ/15ML (10%) PO SOLN
40.0000 meq | Freq: Three times a day (TID) | ORAL | Status: AC
  Administered 2019-05-15 (×2): 40 meq
  Filled 2019-05-15 (×2): qty 30

## 2019-05-15 MED ORDER — SULFAMETHOXAZOLE-TRIMETHOPRIM 400-80 MG/5ML IV SOLN
400.0000 mg | Freq: Three times a day (TID) | INTRAVENOUS | Status: AC
  Administered 2019-05-15 – 2019-05-22 (×21): 400 mg via INTRAVENOUS
  Filled 2019-05-15 (×23): qty 25

## 2019-05-15 MED ORDER — POTASSIUM CHLORIDE 20 MEQ/15ML (10%) PO SOLN: 30.0000 meq | ORAL | Status: DC

## 2019-05-15 MED ORDER — WARFARIN SODIUM 10 MG PO TABS
10.0000 mg | ORAL_TABLET | Freq: Once | ORAL | Status: DC
  Filled 2019-05-15: qty 1

## 2019-05-15 MED ORDER — ACETAZOLAMIDE SODIUM 500 MG IJ SOLR
250.0000 mg | Freq: Four times a day (QID) | INTRAMUSCULAR | Status: AC
  Administered 2019-05-15 (×3): 250 mg via INTRAVENOUS
  Filled 2019-05-15 (×3): qty 250

## 2019-05-15 MED ORDER — WARFARIN SODIUM 7.5 MG PO TABS: 17.5000 mg | ORAL_TABLET | Freq: Once | ORAL | Status: DC

## 2019-05-15 MED ORDER — WARFARIN SODIUM 10 MG PO TABS
10.0000 mg | ORAL_TABLET | Freq: Once | ORAL | Status: AC
  Administered 2019-05-15: 10 mg
  Filled 2019-05-15: qty 1
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
## 2019-05-15 NOTE — Progress Notes (Signed)
[facility] ADULT ICU REPLACEMENT PROTOCOL FOR AM LAB REPLACEMENT ONLY  The patient does apply for the [facility] Adult ICU Electrolyte Replacment Protocol based on the criteria listed below:   1. Is GFR >/= 40 ml/min? Yes.    Patient's GFR today is >60 2. Is urine output >/= 0.5 ml/kg/hr for the last 6 hours? Yes.   Patient's UOP is 1.91 ml/kg/hr 3. Is BUN < 60 mg/dL? Yes.    Patient's BUN today is 29 4. Abnormal electrolyte(s): K+ 3.2 5. Ordered repletion with: protocol 6. If a panic level lab has been reported, has the CCM MD in charge been notified? Yes.  .   Physician:  Dr.Deterding  Carlisle Beers 05/15/2019 6:30 AM
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
## 2019-05-16 ENCOUNTER — Inpatient Hospital Stay (HOSPITAL_COMMUNITY): Payer: BC Managed Care – PPO

## 2019-05-16 DIAGNOSIS — S8251XA Displaced fracture of medial malleolus of right tibia, initial encounter for closed fracture: Secondary | ICD-10-CM | POA: Diagnosis not present

## 2019-05-16 LAB — GLUCOSE, CAPILLARY
Glucose-Capillary: 108 mg/dL — ABNORMAL HIGH (ref 70–99)
Glucose-Capillary: 116 mg/dL — ABNORMAL HIGH (ref 70–99)
Glucose-Capillary: 123 mg/dL — ABNORMAL HIGH (ref 70–99)
Glucose-Capillary: 130 mg/dL — ABNORMAL HIGH (ref 70–99)
Glucose-Capillary: 134 mg/dL — ABNORMAL HIGH (ref 70–99)
Glucose-Capillary: 152 mg/dL — ABNORMAL HIGH (ref 70–99)

## 2019-05-16 LAB — POCT I-STAT 7, (LYTES, BLD GAS, ICA,H+H)
Acid-Base Excess: 3 mmol/L — ABNORMAL HIGH (ref 0.0–2.0)
Bicarbonate: 30.5 mmol/L — ABNORMAL HIGH (ref 20.0–28.0)
Calcium, Ion: 1.27 mmol/L (ref 1.15–1.40)
HCT: 33 % — ABNORMAL LOW (ref 39.0–52.0)
Hemoglobin: 11.2 g/dL — ABNORMAL LOW (ref 13.0–17.0)
O2 Saturation: 93 %
Patient temperature: 97.7
Potassium: 3.6 mmol/L (ref 3.5–5.1)
Sodium: 140 mmol/L (ref 135–145)
TCO2: 32 mmol/L (ref 22–32)
pCO2 arterial: 58.2 mmHg — ABNORMAL HIGH (ref 32.0–48.0)
pH, Arterial: 7.326 — ABNORMAL LOW (ref 7.350–7.450)
pO2, Arterial: 74 mmHg — ABNORMAL LOW (ref 83.0–108.0)

## 2019-05-16 LAB — PHOSPHORUS: Phosphorus: 2.3 mg/dL — ABNORMAL LOW (ref 2.5–4.6)

## 2019-05-16 LAB — BASIC METABOLIC PANEL
Anion gap: 7 (ref 5–15)
BUN: 26 mg/dL — ABNORMAL HIGH (ref 6–20)
CO2: 29 mmol/L (ref 22–32)
Calcium: 8.1 mg/dL — ABNORMAL LOW (ref 8.9–10.3)
Chloride: 104 mmol/L (ref 98–111)
Creatinine, Ser: 0.53 mg/dL — ABNORMAL LOW (ref 0.61–1.24)
GFR calc Af Amer: >60 mL/min (ref >60)
GFR calc non Af Amer: >60 mL/min (ref >60)
Glucose, Bld: 134 mg/dL — ABNORMAL HIGH (ref 70–99)
Potassium: 3.6 mmol/L (ref 3.5–5.1)
Sodium: 140 mmol/L (ref 135–145)

## 2019-05-16 LAB — CBC
HCT: 25.9 % — ABNORMAL LOW (ref 39.0–52.0)
Hemoglobin: 7.8 g/dL — ABNORMAL LOW (ref 13.0–17.0)
MCH: 29.7 pg (ref 26.0–34.0)
MCHC: 30.1 g/dL (ref 30.0–36.0)
MCV: 98.5 fL (ref 80.0–100.0)
Platelets: 504 10*3/uL — ABNORMAL HIGH (ref 150–400)
RBC: 2.63 MIL/uL — ABNORMAL LOW (ref 4.22–5.81)
RDW: 14.3 % (ref 11.5–15.5)
WBC: 20.5 10*3/uL — ABNORMAL HIGH (ref 4.0–10.5)
nRBC: 0 % (ref 0.0–0.2)

## 2019-05-16 LAB — PROTIME-INR
INR: 2.2 — ABNORMAL HIGH (ref 0.8–1.2)
Prothrombin Time: 24.3 s — ABNORMAL HIGH (ref 11.4–15.2)

## 2019-05-16 LAB — PROCALCITONIN: Procalcitonin: 0.39 ng/mL

## 2019-05-16 LAB — MAGNESIUM: Magnesium: 2 mg/dL (ref 1.7–2.4)

## 2019-05-16 LAB — TRIGLYCERIDES: Triglycerides: 97 mg/dL (ref <150)

## 2019-05-16 MED ORDER — PIVOT 1.5 CAL PO LIQD
1000.0000 mL | ORAL | Status: DC
  Administered 2019-05-16 – 2019-05-22 (×7): 1000 mL
  Filled 2019-05-16 (×5): qty 1000

## 2019-05-16 MED ORDER — WARFARIN SODIUM 7.5 MG PO TABS
7.5000 mg | ORAL_TABLET | Freq: Once | ORAL | Status: AC
  Administered 2019-05-16: 17:00:00 7.5 mg
  Filled 2019-05-16: qty 1
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
## 2019-05-17 ENCOUNTER — Inpatient Hospital Stay (HOSPITAL_COMMUNITY): Payer: BC Managed Care – PPO

## 2019-05-17 DIAGNOSIS — S8251XA Displaced fracture of medial malleolus of right tibia, initial encounter for closed fracture: Secondary | ICD-10-CM | POA: Diagnosis not present

## 2019-05-17 DIAGNOSIS — L8990 Pressure ulcer of unspecified site, unspecified stage: Secondary | ICD-10-CM | POA: Insufficient documentation

## 2019-05-17 HISTORY — DX: Pressure ulcer of unspecified site, unspecified stage: L89.90

## 2019-05-17 LAB — POCT I-STAT 7, (LYTES, BLD GAS, ICA,H+H)
Acid-Base Excess: 6 mmol/L — ABNORMAL HIGH (ref 0.0–2.0)
Bicarbonate: 33.7 mmol/L — ABNORMAL HIGH (ref 20.0–28.0)
Calcium, Ion: 1.25 mmol/L (ref 1.15–1.40)
HCT: 26 % — ABNORMAL LOW (ref 39.0–52.0)
Hemoglobin: 8.8 g/dL — ABNORMAL LOW (ref 13.0–17.0)
O2 Saturation: 91 %
Patient temperature: 99.1
Potassium: 3.8 mmol/L (ref 3.5–5.1)
Sodium: 138 mmol/L (ref 135–145)
TCO2: 36 mmol/L — ABNORMAL HIGH (ref 22–32)
pCO2 arterial: 68.6 mmHg (ref 32.0–48.0)
pH, Arterial: 7.301 — ABNORMAL LOW (ref 7.350–7.450)
pO2, Arterial: 71 mmHg — ABNORMAL LOW (ref 83.0–108.0)

## 2019-05-17 LAB — GLUCOSE, CAPILLARY
Glucose-Capillary: 121 mg/dL — ABNORMAL HIGH (ref 70–99)
Glucose-Capillary: 130 mg/dL — ABNORMAL HIGH (ref 70–99)
Glucose-Capillary: 148 mg/dL — ABNORMAL HIGH (ref 70–99)
Glucose-Capillary: 164 mg/dL — ABNORMAL HIGH (ref 70–99)
Glucose-Capillary: 165 mg/dL — ABNORMAL HIGH (ref 70–99)
Glucose-Capillary: 189 mg/dL — ABNORMAL HIGH (ref 70–99)

## 2019-05-17 LAB — PROCALCITONIN: Procalcitonin: 0.25 ng/mL

## 2019-05-17 LAB — PROTIME-INR
INR: 1.9 — ABNORMAL HIGH (ref 0.8–1.2)
Prothrombin Time: 21.4 s — ABNORMAL HIGH (ref 11.4–15.2)

## 2019-05-17 MED ORDER — IPRATROPIUM-ALBUTEROL 0.5-2.5 (3) MG/3ML IN SOLN
3.0000 mL | Freq: Three times a day (TID) | RESPIRATORY_TRACT | Status: DC
  Administered 2019-05-17 – 2019-05-21 (×13): 3 mL via RESPIRATORY_TRACT
  Filled 2019-05-17 (×13): qty 3

## 2019-05-17 MED ORDER — WARFARIN SODIUM 10 MG PO TABS
10.0000 mg | ORAL_TABLET | Freq: Once | ORAL | Status: AC
  Administered 2019-05-17: 18:00:00 10 mg via ORAL
  Filled 2019-05-17: qty 1
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
## 2019-05-17 NOTE — Progress Notes (Signed)
[place] Progress Note Patient Name: Luke Choi DOB: 1971-03-31 MRN: [id number]   Date of Service  05/17/2019  HPI/Events of Note  ABG on 60%/PC 15/Rate 15/P 10 = 7.30/68.5/71.0  eICU Interventions  Will order: 1. Portable CXR now to r/o pneumothorax or atelectasis.      Intervention Category Major Interventions: Acid-Base disturbance - evaluation and management;Hypercarbia - evaluation and management;Respiratory failure - evaluation and management  Dewayne Jurek Cornelia Copa 05/17/2019, 3:39 AM
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
## 2019-05-17 NOTE — Progress Notes (Addendum)
Pt Sp02 dropped in to the 80's. abg drawn, results called to [facility] MD. No changes at this time. X-ray ordered for this AM. Will continue to monitor.
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
## 2019-05-18 ENCOUNTER — Inpatient Hospital Stay (HOSPITAL_COMMUNITY): Payer: BC Managed Care – PPO

## 2019-05-18 DIAGNOSIS — J80 Acute respiratory distress syndrome: Secondary | ICD-10-CM | POA: Diagnosis not present

## 2019-05-18 DIAGNOSIS — Z9911 Dependence on respirator [ventilator] status: Secondary | ICD-10-CM | POA: Diagnosis not present

## 2019-05-18 DIAGNOSIS — S8251XA Displaced fracture of medial malleolus of right tibia, initial encounter for closed fracture: Secondary | ICD-10-CM

## 2019-05-18 DIAGNOSIS — J9601 Acute respiratory failure with hypoxia: Secondary | ICD-10-CM | POA: Diagnosis not present

## 2019-05-18 DIAGNOSIS — G9341 Metabolic encephalopathy: Secondary | ICD-10-CM

## 2019-05-18 LAB — CBC
HCT: 25.4 % — ABNORMAL LOW (ref 39.0–52.0)
Hemoglobin: 7.5 g/dL — ABNORMAL LOW (ref 13.0–17.0)
MCH: 28.3 pg (ref 26.0–34.0)
MCHC: 29.5 g/dL — ABNORMAL LOW (ref 30.0–36.0)
MCV: 95.8 fL (ref 80.0–100.0)
Platelets: 622 10*3/uL — ABNORMAL HIGH (ref 150–400)
RBC: 2.65 MIL/uL — ABNORMAL LOW (ref 4.22–5.81)
RDW: 14.2 % (ref 11.5–15.5)
WBC: 16.5 10*3/uL — ABNORMAL HIGH (ref 4.0–10.5)
nRBC: 0 % (ref 0.0–0.2)

## 2019-05-18 LAB — BASIC METABOLIC PANEL
Anion gap: 9 (ref 5–15)
BUN: 17 mg/dL (ref 6–20)
CO2: 30 mmol/L (ref 22–32)
Calcium: 7.9 mg/dL — ABNORMAL LOW (ref 8.9–10.3)
Chloride: 100 mmol/L (ref 98–111)
Creatinine, Ser: 0.41 mg/dL — ABNORMAL LOW (ref 0.61–1.24)
GFR calc Af Amer: >60 mL/min (ref >60)
GFR calc non Af Amer: >60 mL/min (ref >60)
Glucose, Bld: 160 mg/dL — ABNORMAL HIGH (ref 70–99)
Potassium: 3.7 mmol/L (ref 3.5–5.1)
Sodium: 139 mmol/L (ref 135–145)

## 2019-05-18 LAB — GLUCOSE, CAPILLARY
Glucose-Capillary: 150 mg/dL — ABNORMAL HIGH (ref 70–99)
Glucose-Capillary: 149 mg/dL — ABNORMAL HIGH (ref 70–99)
Glucose-Capillary: 156 mg/dL — ABNORMAL HIGH (ref 70–99)
Glucose-Capillary: 128 mg/dL — ABNORMAL HIGH (ref 70–99)
Glucose-Capillary: 156 mg/dL — ABNORMAL HIGH (ref 70–99)

## 2019-05-18 LAB — PROTIME-INR
INR: 2 — ABNORMAL HIGH (ref 0.8–1.2)
Prothrombin Time: 22.3 s — ABNORMAL HIGH (ref 11.4–15.2)

## 2019-05-18 MED ORDER — CLONAZEPAM 0.1 MG/ML ORAL SUSPENSION: 1.0000 mg | Freq: Three times a day (TID) | ORAL | Status: DC

## 2019-05-18 MED ORDER — CLONAZEPAM 0.5 MG PO TBDP: 0.5000 mg | ORAL_TABLET | Freq: Two times a day (BID) | ORAL | Status: DC

## 2019-05-18 MED ORDER — OXYCODONE HCL 5 MG/5ML PO SOLN
15.0000 mg | ORAL | Status: DC
  Administered 2019-05-18 – 2019-05-21 (×18): 15 mg
  Filled 2019-05-18 (×18): qty 15

## 2019-05-18 MED ORDER — CLONAZEPAM 0.25 MG PO TBDP: 0.2500 mg | ORAL_TABLET | Freq: Two times a day (BID) | ORAL | Status: DC

## 2019-05-18 MED ORDER — CLONAZEPAM 0.5 MG PO TBDP: 1.0000 mg | ORAL_TABLET | Freq: Two times a day (BID) | ORAL | Status: DC

## 2019-05-18 MED ORDER — MIDAZOLAM HCL 2 MG/2ML IJ SOLN
2.0000 mg | INTRAMUSCULAR | Status: DC | PRN
  Administered 2019-05-18 – 2019-05-21 (×16): 2 mg via INTRAVENOUS
  Filled 2019-05-18 (×17): qty 2

## 2019-05-18 MED ORDER — CLONAZEPAM 0.1 MG/ML ORAL SUSPENSION: 0.5000 mg | Freq: Two times a day (BID) | ORAL | Status: DC

## 2019-05-18 MED ORDER — FENTANYL 50 MCG/HR TD PT72
1.0000 | MEDICATED_PATCH | TRANSDERMAL | Status: DC
  Administered 2019-05-18: 1 via TRANSDERMAL
  Filled 2019-05-18 (×2): qty 1

## 2019-05-18 MED ORDER — CLONAZEPAM 0.5 MG PO TBDP
1.0000 mg | ORAL_TABLET | Freq: Two times a day (BID) | ORAL | Status: DC
  Administered 2019-05-20 – 2019-05-21 (×3): 1 mg
  Filled 2019-05-18 (×3): qty 2

## 2019-05-18 MED ORDER — METOPROLOL TARTRATE 25 MG/10 ML ORAL SUSPENSION
75.0000 mg | Freq: Three times a day (TID) | ORAL | Status: DC
  Administered 2019-05-18 – 2019-06-08 (×58): 75 mg
  Filled 2019-05-18 (×57): qty 30

## 2019-05-18 MED ORDER — CLONAZEPAM 0.5 MG PO TBDP
1.0000 mg | ORAL_TABLET | Freq: Three times a day (TID) | ORAL | Status: DC
  Administered 2019-05-18: 1 mg via ORAL
  Filled 2019-05-18: qty 2

## 2019-05-18 MED ORDER — CLONAZEPAM 0.5 MG PO TBDP
1.0000 mg | ORAL_TABLET | Freq: Three times a day (TID) | ORAL | Status: AC
  Administered 2019-05-18 – 2019-05-19 (×5): 1 mg
  Filled 2019-05-18 (×5): qty 2

## 2019-05-18 MED ORDER — CLONAZEPAM 0.1 MG/ML ORAL SUSPENSION: 0.2500 mg | Freq: Two times a day (BID) | ORAL | Status: DC

## 2019-05-18 MED ORDER — CLONAZEPAM 0.1 MG/ML ORAL SUSPENSION: 1.0000 mg | Freq: Two times a day (BID) | ORAL | Status: DC

## 2019-05-18 NOTE — Consult Note (Signed)
[place] Nurse wound follow up Patient receiving care in [facility] 4[facility]. Patient intubated, sedated. Wound type: surgical to LLE Measurement: 5.2 cm x 2.5 cm no measureable depth Wound bed: 75% yellow, remainder beefy red Drainage (amount, consistency, odor) scant bleeding when existing dressing removed Periwound: intact Dressing procedure/placement/frequency: one piece of Mepitel and black foam removed from wound. One of each replaced to wound bed. Immediate seal obtained. [place] team member will change again Monday 10/12. Val Riles, RN, MSN, CWOCN, CNS-BC, pager [phone]
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
## 2019-05-18 NOTE — Procedures (Signed)
Chest Tube Insertion Procedure Note  Indications:  Clinically significant right pneumothorax  Pre-operative Diagnosis: right pneumothorax  Post-operative Diagnosis: right pneumothorax  Procedure Details  Informed consent was obtained for the procedure, including sedation.  Risks of lung perforation, hemorrhage, arrhythmia, and adverse drug reaction were discussed.   After sterile skin prep, using standard technique, a 14 French tube was placed in the right anterior axillary line well above the nipple level using Seldinger technique. It was sutured in place and hooked up to the Pleurevac.  Findings: Air and fluid  Estimated Blood Loss:  min         Specimens:  None              Complications:  None; patient tolerated the procedure well.         Disposition: ICU - intubated and critically ill.         Condition: stable

## 2019-05-18 NOTE — Progress Notes (Signed)
Wasted 44mL of Versed from bag, witnessed by Holland Commons, RN.

## 2019-05-19 ENCOUNTER — Inpatient Hospital Stay (HOSPITAL_COMMUNITY): Payer: BC Managed Care – PPO

## 2019-05-19 DIAGNOSIS — J9601 Acute respiratory failure with hypoxia: Secondary | ICD-10-CM | POA: Diagnosis not present

## 2019-05-19 DIAGNOSIS — S81812D Laceration without foreign body, left lower leg, subsequent encounter: Secondary | ICD-10-CM

## 2019-05-19 DIAGNOSIS — J80 Acute respiratory distress syndrome: Secondary | ICD-10-CM | POA: Diagnosis not present

## 2019-05-19 DIAGNOSIS — S8251XA Displaced fracture of medial malleolus of right tibia, initial encounter for closed fracture: Secondary | ICD-10-CM | POA: Diagnosis not present

## 2019-05-19 LAB — GLUCOSE, CAPILLARY
Glucose-Capillary: 126 mg/dL — ABNORMAL HIGH (ref 70–99)
Glucose-Capillary: 127 mg/dL — ABNORMAL HIGH (ref 70–99)
Glucose-Capillary: 141 mg/dL — ABNORMAL HIGH (ref 70–99)
Glucose-Capillary: 144 mg/dL — ABNORMAL HIGH (ref 70–99)
Glucose-Capillary: 154 mg/dL — ABNORMAL HIGH (ref 70–99)
Glucose-Capillary: 167 mg/dL — ABNORMAL HIGH (ref 70–99)
Glucose-Capillary: 188 mg/dL — ABNORMAL HIGH (ref 70–99)

## 2019-05-19 LAB — BASIC METABOLIC PANEL
Anion gap: 9 (ref 5–15)
BUN: 18 mg/dL (ref 6–20)
CO2: 31 mmol/L (ref 22–32)
Calcium: 7.9 mg/dL — ABNORMAL LOW (ref 8.9–10.3)
Chloride: 97 mmol/L — ABNORMAL LOW (ref 98–111)
Creatinine, Ser: 0.31 mg/dL — ABNORMAL LOW (ref 0.61–1.24)
GFR calc Af Amer: >60 mL/min (ref >60)
GFR calc non Af Amer: >60 mL/min (ref >60)
Glucose, Bld: 159 mg/dL — ABNORMAL HIGH (ref 70–99)
Potassium: 4.2 mmol/L (ref 3.5–5.1)
Sodium: 137 mmol/L (ref 135–145)

## 2019-05-19 LAB — CBC
HCT: 24 % — ABNORMAL LOW (ref 39.0–52.0)
Hemoglobin: 7.3 g/dL — ABNORMAL LOW (ref 13.0–17.0)
MCH: 29 pg (ref 26.0–34.0)
MCHC: 30.4 g/dL (ref 30.0–36.0)
MCV: 95.2 fL (ref 80.0–100.0)
Platelets: 533 10*3/uL — ABNORMAL HIGH (ref 150–400)
RBC: 2.52 MIL/uL — ABNORMAL LOW (ref 4.22–5.81)
RDW: 14.3 % (ref 11.5–15.5)
WBC: 16.7 10*3/uL — ABNORMAL HIGH (ref 4.0–10.5)
nRBC: 0 % (ref 0.0–0.2)

## 2019-05-19 LAB — PROTIME-INR
INR: 1.6 — ABNORMAL HIGH (ref 0.8–1.2)
Prothrombin Time: 18.7 s — ABNORMAL HIGH (ref 11.4–15.2)
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
## 2019-05-20 ENCOUNTER — Inpatient Hospital Stay (HOSPITAL_COMMUNITY): Payer: BC Managed Care – PPO

## 2019-05-20 DIAGNOSIS — Z9911 Dependence on respirator [ventilator] status: Secondary | ICD-10-CM | POA: Diagnosis not present

## 2019-05-20 DIAGNOSIS — J9601 Acute respiratory failure with hypoxia: Secondary | ICD-10-CM | POA: Diagnosis not present

## 2019-05-20 DIAGNOSIS — S81812D Laceration without foreign body, left lower leg, subsequent encounter: Secondary | ICD-10-CM | POA: Diagnosis not present

## 2019-05-20 DIAGNOSIS — S8251XA Displaced fracture of medial malleolus of right tibia, initial encounter for closed fracture: Secondary | ICD-10-CM | POA: Diagnosis not present

## 2019-05-20 DIAGNOSIS — A498 Other bacterial infections of unspecified site: Secondary | ICD-10-CM

## 2019-05-20 LAB — BASIC METABOLIC PANEL WITH GFR
Anion gap: 14 (ref 5–15)
BUN: 12 mg/dL (ref 6–20)
CO2: 29 mmol/L (ref 22–32)
Calcium: 7.9 mg/dL — ABNORMAL LOW (ref 8.9–10.3)
Chloride: 92 mmol/L — ABNORMAL LOW (ref 98–111)
Creatinine, Ser: <0.3 mg/dL — ABNORMAL LOW (ref 0.61–1.24)
Glucose, Bld: 173 mg/dL — ABNORMAL HIGH (ref 70–99)
Potassium: 4.2 mmol/L (ref 3.5–5.1)
Sodium: 135 mmol/L (ref 135–145)

## 2019-05-20 LAB — CBC
HCT: 25.1 % — ABNORMAL LOW (ref 39.0–52.0)
Hemoglobin: 7.7 g/dL — ABNORMAL LOW (ref 13.0–17.0)
MCH: 28.9 pg (ref 26.0–34.0)
MCHC: 30.7 g/dL (ref 30.0–36.0)
MCV: 94.4 fL (ref 80.0–100.0)
Platelets: 537 10*3/uL — ABNORMAL HIGH (ref 150–400)
RBC: 2.66 MIL/uL — ABNORMAL LOW (ref 4.22–5.81)
RDW: 14.4 % (ref 11.5–15.5)
WBC: 20.8 10*3/uL — ABNORMAL HIGH (ref 4.0–10.5)
nRBC: 0 % (ref 0.0–0.2)

## 2019-05-20 LAB — GLUCOSE, CAPILLARY
Glucose-Capillary: 146 mg/dL — ABNORMAL HIGH (ref 70–99)
Glucose-Capillary: 149 mg/dL — ABNORMAL HIGH (ref 70–99)
Glucose-Capillary: 125 mg/dL — ABNORMAL HIGH (ref 70–99)
Glucose-Capillary: 137 mg/dL — ABNORMAL HIGH (ref 70–99)
Glucose-Capillary: 138 mg/dL — ABNORMAL HIGH (ref 70–99)
Glucose-Capillary: 151 mg/dL — ABNORMAL HIGH (ref 70–99)

## 2019-05-20 MED ORDER — FREE WATER
100.0000 mL | Freq: Four times a day (QID) | Status: DC
  Administered 2019-05-20 – 2019-06-08 (×72): 100 mL

## 2019-05-20 MED ORDER — FENTANYL CITRATE (PF) 100 MCG/2ML IJ SOLN
50.0000 ug | INTRAMUSCULAR | Status: DC | PRN
  Administered 2019-05-20 – 2019-05-21 (×10): 100 ug via INTRAVENOUS
  Filled 2019-05-20 (×10): qty 2
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
## 2019-05-20 NOTE — Progress Notes (Signed)
Fentanyl 25 mL wasted in sink with Dulcy Fanny RN when gtt discontinued.
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
## 2019-05-20 NOTE — TOC Progression Note (Signed)
Transition of Care [facility]) - Progression Note    Patient Details  Name: Luke Choi MRN: [id number] Date of Birth: 31-Oct-1970  [facility]) CM/SW Contact  [name], RN Phone Number: 05/20/2019, 5:04 PM  Clinical Narrative:  Met at bedside with fiance, who is tearful. She is concerned about patient's insurance staying active, paying his bills, and making decisions for him.  Of course, she is very concerned about patient, and is aware this is going to be a long haul.  We have already completed short term disability forms for pt, and I have encouraged her to get those in ASAP.  I advised her that she may wish to get a lawyer to assist her or patient's daughter with temporary guardianship, as she will have difficulty accessing patient's bank account and finances without it.  I also advised that she talk to patient's work about COBRA, should he be terminated from his work, as he is not eligible for [employer]. (He has been at this job <1 year).  At all cost, patient must keep his medical insurance, though COBRA premiums with be costly.  He will likely qualify for disability, and she can apply for this for him (with guardianship).  I suggested that if money is an issue (and it usually is), that she may consider asking a friend of his or hers to start a [employer] page for him, as people really care and want to help him.  Lastly, we talked about Long term care hospitals:  She did make it clear that she would consider LTAC, if this was recommended, and prefers [facility], if possible.  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   Provided emotional support for fiance, as she was very tearful.  She appreciated my visit.  Will continue to follow/assist as needed.      Expected Discharge Plan: [facility] (LTAC)    Expected Discharge Plan and Services Expected Discharge Plan: Long Term Acute Care (LTAC)   Discharge Planning Services: CM  Consult   Living arrangements for the past 2 months: [place], RN, BSN  Trauma/Neuro ICU Case Manager [phone]

## 2019-05-21 DIAGNOSIS — J9601 Acute respiratory failure with hypoxia: Secondary | ICD-10-CM | POA: Diagnosis not present

## 2019-05-21 LAB — CBC
HCT: 26.7 % — ABNORMAL LOW (ref 39.0–52.0)
Hemoglobin: 8.1 g/dL — ABNORMAL LOW (ref 13.0–17.0)
MCH: 28.4 pg (ref 26.0–34.0)
MCHC: 30.3 g/dL (ref 30.0–36.0)
MCV: 93.7 fL (ref 80.0–100.0)
Platelets: 607 10*3/uL — ABNORMAL HIGH (ref 150–400)
RBC: 2.85 MIL/uL — ABNORMAL LOW (ref 4.22–5.81)
RDW: 14.6 % (ref 11.5–15.5)
WBC: 20.8 10*3/uL — ABNORMAL HIGH (ref 4.0–10.5)
nRBC: 0 % (ref 0.0–0.2)

## 2019-05-21 LAB — BASIC METABOLIC PANEL
Anion gap: 9 (ref 5–15)
BUN: 12 mg/dL (ref 6–20)
CO2: 30 mmol/L (ref 22–32)
Calcium: 7.8 mg/dL — ABNORMAL LOW (ref 8.9–10.3)
Chloride: 94 mmol/L — ABNORMAL LOW (ref 98–111)
Creatinine, Ser: 0.31 mg/dL — ABNORMAL LOW (ref 0.61–1.24)
GFR calc Af Amer: >60 mL/min (ref >60)
GFR calc non Af Amer: >60 mL/min (ref >60)
Glucose, Bld: 166 mg/dL — ABNORMAL HIGH (ref 70–99)
Potassium: 4.1 mmol/L (ref 3.5–5.1)
Sodium: 133 mmol/L — ABNORMAL LOW (ref 135–145)

## 2019-05-21 LAB — GLUCOSE, CAPILLARY
Glucose-Capillary: 113 mg/dL — ABNORMAL HIGH (ref 70–99)
Glucose-Capillary: 124 mg/dL — ABNORMAL HIGH (ref 70–99)
Glucose-Capillary: 135 mg/dL — ABNORMAL HIGH (ref 70–99)
Glucose-Capillary: 146 mg/dL — ABNORMAL HIGH (ref 70–99)
Glucose-Capillary: 175 mg/dL — ABNORMAL HIGH (ref 70–99)
Glucose-Capillary: 97 mg/dL (ref 70–99)

## 2019-05-21 LAB — MAGNESIUM: Magnesium: 1.9 mg/dL (ref 1.7–2.4)

## 2019-05-21 LAB — PHOSPHORUS: Phosphorus: 2.6 mg/dL (ref 2.5–4.6)

## 2019-05-21 MED ORDER — MIDAZOLAM HCL 2 MG/2ML IJ SOLN
2.0000 mg | INTRAMUSCULAR | Status: DC | PRN
  Administered 2019-05-22 – 2019-05-23 (×3): 2 mg via INTRAVENOUS
  Filled 2019-05-21 (×3): qty 2

## 2019-05-21 MED ORDER — FENTANYL 2500MCG IN NS 250ML (10MCG/ML) PREMIX INFUSION
50.0000 ug/h | INTRAVENOUS | Status: DC
  Administered 2019-05-21: 200 ug/h via INTRAVENOUS
  Administered 2019-05-21: 50 ug/h via INTRAVENOUS
  Administered 2019-05-22: 175 ug/h via INTRAVENOUS
  Administered 2019-05-22 – 2019-05-23 (×3): 200 ug/h via INTRAVENOUS
  Filled 2019-05-21 (×8): qty 250

## 2019-05-21 MED ORDER — PROPOFOL 1000 MG/100ML IV EMUL
0.0000 ug/kg/min | INTRAVENOUS | Status: AC
  Administered 2019-05-21: 10 ug/kg/min via INTRAVENOUS
  Administered 2019-05-21: 30 ug/kg/min via INTRAVENOUS
  Administered 2019-05-22: 50 ug/kg/min via INTRAVENOUS
  Administered 2019-05-22 (×2): 30 ug/kg/min via INTRAVENOUS
  Administered 2019-05-22 – 2019-05-23 (×7): 50 ug/kg/min via INTRAVENOUS
  Filled 2019-05-21 (×15): qty 100

## 2019-05-21 MED ORDER — FENTANYL BOLUS VIA INFUSION
50.0000 ug | INTRAVENOUS | Status: DC | PRN
  Administered 2019-05-22 (×2): 50 ug via INTRAVENOUS
  Filled 2019-05-21: qty 50

## 2019-05-21 MED ORDER — FUROSEMIDE 10 MG/ML IJ SOLN
40.0000 mg | Freq: Once | INTRAMUSCULAR | Status: AC
  Administered 2019-05-21: 11:00:00 40 mg via INTRAVENOUS
  Filled 2019-05-21: qty 4
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
## 2019-05-22 ENCOUNTER — Inpatient Hospital Stay (HOSPITAL_COMMUNITY): Payer: BC Managed Care – PPO

## 2019-05-22 DIAGNOSIS — J9601 Acute respiratory failure with hypoxia: Secondary | ICD-10-CM | POA: Diagnosis not present

## 2019-05-22 LAB — BASIC METABOLIC PANEL
Anion gap: 10 (ref 5–15)
BUN: 17 mg/dL (ref 6–20)
CO2: 31 mmol/L (ref 22–32)
Calcium: 7.8 mg/dL — ABNORMAL LOW (ref 8.9–10.3)
Chloride: 93 mmol/L — ABNORMAL LOW (ref 98–111)
Creatinine, Ser: 0.34 mg/dL — ABNORMAL LOW (ref 0.61–1.24)
GFR calc Af Amer: >60 mL/min (ref >60)
GFR calc non Af Amer: >60 mL/min (ref >60)
Glucose, Bld: 155 mg/dL — ABNORMAL HIGH (ref 70–99)
Potassium: 4.4 mmol/L (ref 3.5–5.1)
Sodium: 134 mmol/L — ABNORMAL LOW (ref 135–145)

## 2019-05-22 LAB — FERRITIN: Ferritin: 1882 ng/mL — ABNORMAL HIGH (ref 24–336)

## 2019-05-22 LAB — CBC
HCT: 24.3 % — ABNORMAL LOW (ref 39.0–52.0)
Hemoglobin: 7.6 g/dL — ABNORMAL LOW (ref 13.0–17.0)
MCH: 29.2 pg (ref 26.0–34.0)
MCHC: 31.3 g/dL (ref 30.0–36.0)
MCV: 93.5 fL (ref 80.0–100.0)
Platelets: 529 10*3/uL — ABNORMAL HIGH (ref 150–400)
RBC: 2.6 MIL/uL — ABNORMAL LOW (ref 4.22–5.81)
RDW: 15.1 % (ref 11.5–15.5)
WBC: 16.6 10*3/uL — ABNORMAL HIGH (ref 4.0–10.5)
nRBC: 0 % (ref 0.0–0.2)

## 2019-05-22 LAB — IRON AND TIBC
Iron: 26 ug/dL — ABNORMAL LOW (ref 45–182)
Saturation Ratios: 15 % — ABNORMAL LOW (ref 17.9–39.5)
TIBC: 172 ug/dL — ABNORMAL LOW (ref 250–450)
UIBC: 146 ug/dL

## 2019-05-22 LAB — MAGNESIUM: Magnesium: 1.9 mg/dL (ref 1.7–2.4)

## 2019-05-22 LAB — GLUCOSE, CAPILLARY
Glucose-Capillary: 118 mg/dL — ABNORMAL HIGH (ref 70–99)
Glucose-Capillary: 118 mg/dL — ABNORMAL HIGH (ref 70–99)
Glucose-Capillary: 98 mg/dL (ref 70–99)
Glucose-Capillary: 134 mg/dL — ABNORMAL HIGH (ref 70–99)
Glucose-Capillary: 113 mg/dL — ABNORMAL HIGH (ref 70–99)

## 2019-05-22 LAB — PHOSPHORUS: Phosphorus: 3.9 mg/dL (ref 2.5–4.6)

## 2019-05-22 LAB — PROTIME-INR
INR: 1.1 (ref 0.8–1.2)
Prothrombin Time: 14.4 seconds (ref 11.4–15.2)

## 2019-05-22 LAB — TRIGLYCERIDES: Triglycerides: 108 mg/dL (ref <150)

## 2019-05-22 MED ORDER — NOREPINEPHRINE 4 MG/250ML-% IV SOLN
0.0000 ug/min | INTRAVENOUS | Status: DC
  Administered 2019-05-22: 4 ug/min via INTRAVENOUS
  Administered 2019-05-23: 2 ug/min via INTRAVENOUS
  Administered 2019-05-23: 10 ug/min via INTRAVENOUS
  Administered 2019-05-23 – 2019-05-24 (×2): 12 ug/min via INTRAVENOUS
  Filled 2019-05-22 (×6): qty 250

## 2019-05-22 MED ORDER — FUROSEMIDE 10 MG/ML IJ SOLN
40.0000 mg | Freq: Once | INTRAMUSCULAR | Status: AC
  Administered 2019-05-22: 40 mg via INTRAVENOUS
  Filled 2019-05-22: qty 4
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
## 2019-05-23 ENCOUNTER — Inpatient Hospital Stay (HOSPITAL_COMMUNITY): Payer: BC Managed Care – PPO

## 2019-05-23 ENCOUNTER — Inpatient Hospital Stay: Payer: Self-pay

## 2019-05-23 LAB — BASIC METABOLIC PANEL
Anion gap: 9 (ref 5–15)
BUN: 15 mg/dL (ref 6–20)
CO2: 32 mmol/L (ref 22–32)
Calcium: 8.1 mg/dL — ABNORMAL LOW (ref 8.9–10.3)
Chloride: 94 mmol/L — ABNORMAL LOW (ref 98–111)
Creatinine, Ser: 0.35 mg/dL — ABNORMAL LOW (ref 0.61–1.24)
GFR calc Af Amer: >60 mL/min (ref >60)
GFR calc non Af Amer: >60 mL/min (ref >60)
Glucose, Bld: 104 mg/dL — ABNORMAL HIGH (ref 70–99)
Potassium: 4.2 mmol/L (ref 3.5–5.1)
Sodium: 135 mmol/L (ref 135–145)

## 2019-05-23 LAB — GLUCOSE, CAPILLARY
Glucose-Capillary: 101 mg/dL — ABNORMAL HIGH (ref 70–99)
Glucose-Capillary: 110 mg/dL — ABNORMAL HIGH (ref 70–99)
Glucose-Capillary: 112 mg/dL — ABNORMAL HIGH (ref 70–99)
Glucose-Capillary: 124 mg/dL — ABNORMAL HIGH (ref 70–99)
Glucose-Capillary: 141 mg/dL — ABNORMAL HIGH (ref 70–99)
Glucose-Capillary: 95 mg/dL (ref 70–99)
Glucose-Capillary: 99 mg/dL (ref 70–99)

## 2019-05-23 LAB — CBC
HCT: 24.9 % — ABNORMAL LOW (ref 39.0–52.0)
Hemoglobin: 7.6 g/dL — ABNORMAL LOW (ref 13.0–17.0)
MCH: 28.8 pg (ref 26.0–34.0)
MCHC: 30.5 g/dL (ref 30.0–36.0)
MCV: 94.3 fL (ref 80.0–100.0)
Platelets: 572 10*3/uL — ABNORMAL HIGH (ref 150–400)
RBC: 2.64 MIL/uL — ABNORMAL LOW (ref 4.22–5.81)
RDW: 15.2 % (ref 11.5–15.5)
WBC: 17.6 10*3/uL — ABNORMAL HIGH (ref 4.0–10.5)
nRBC: 0 % (ref 0.0–0.2)

## 2019-05-23 LAB — PHOSPHORUS: Phosphorus: 3.2 mg/dL (ref 2.5–4.6)

## 2019-05-23 LAB — MRSA PCR SCREENING: MRSA by PCR: NEGATIVE

## 2019-05-23 LAB — PROTIME-INR
INR: 1.2 (ref 0.8–1.2)
Prothrombin Time: 14.6 seconds (ref 11.4–15.2)

## 2019-05-23 LAB — MAGNESIUM: Magnesium: 2.1 mg/dL (ref 1.7–2.4)

## 2019-05-23 LAB — TRIGLYCERIDES: Triglycerides: 236 mg/dL — ABNORMAL HIGH (ref <150)

## 2019-05-23 MED ORDER — SODIUM CHLORIDE 0.9% FLUSH
10.0000 mL | Freq: Two times a day (BID) | INTRAVENOUS | Status: DC
  Administered 2019-05-23 – 2019-06-05 (×21): 10 mL
  Administered 2019-06-05: 3 mL
  Administered 2019-06-06 – 2019-06-08 (×5): 10 mL

## 2019-05-23 MED ORDER — SODIUM CHLORIDE 0.9% FLUSH: 10.0000 mL | INTRAVENOUS | Status: DC | PRN

## 2019-05-23 MED ORDER — JUVEN PO PACK
1.0000 | PACK | Freq: Two times a day (BID) | ORAL | Status: DC
  Administered 2019-05-23 – 2019-06-08 (×30): 1
  Filled 2019-05-23 (×34): qty 1

## 2019-05-23 MED ORDER — PRO-STAT SUGAR FREE PO LIQD
60.0000 mL | Freq: Two times a day (BID) | ORAL | Status: DC
  Administered 2019-05-23 – 2019-05-24 (×2): 60 mL
  Filled 2019-05-23 (×2): qty 60

## 2019-05-23 MED ORDER — OXYCODONE HCL 5 MG/5ML PO SOLN
5.0000 mg | ORAL | Status: DC | PRN
  Administered 2019-05-23 – 2019-06-07 (×32): 10 mg
  Filled 2019-05-23 (×33): qty 10

## 2019-05-23 MED ORDER — FENTANYL CITRATE (PF) 100 MCG/2ML IJ SOLN: 100.0000 ug | Freq: Once | INTRAMUSCULAR | Status: DC

## 2019-05-23 MED ORDER — ROCURONIUM BROMIDE 50 MG/5ML IV SOLN
100.0000 mg | Freq: Once | INTRAVENOUS | Status: AC
  Administered 2019-05-23: 100 mg via INTRAVENOUS
  Filled 2019-05-23 (×2): qty 10

## 2019-05-23 MED ORDER — ENOXAPARIN SODIUM 80 MG/0.8ML ~~LOC~~ SOLN
1.0000 mg/kg | Freq: Two times a day (BID) | SUBCUTANEOUS | Status: DC
  Administered 2019-05-24 – 2019-05-27 (×6): 80 mg via SUBCUTANEOUS
  Filled 2019-05-23 (×6): qty 0.8

## 2019-05-23 MED ORDER — DEXMEDETOMIDINE HCL IN NACL 400 MCG/100ML IV SOLN
0.4000 ug/kg/h | INTRAVENOUS | Status: DC
  Administered 2019-05-23: 0.4 ug/kg/h via INTRAVENOUS
  Administered 2019-05-24: 1 ug/kg/h via INTRAVENOUS
  Administered 2019-05-24: 1.2 ug/kg/h via INTRAVENOUS
  Administered 2019-05-24: 1.1 ug/kg/h via INTRAVENOUS
  Administered 2019-05-24 – 2019-05-25 (×4): 1 ug/kg/h via INTRAVENOUS
  Administered 2019-05-26: 17:00:00 0.8 ug/kg/h via INTRAVENOUS
  Administered 2019-05-26: 0.6 ug/kg/h via INTRAVENOUS
  Administered 2019-05-26: 0.8 ug/kg/h via INTRAVENOUS
  Administered 2019-05-26: 0.7 ug/kg/h via INTRAVENOUS
  Administered 2019-05-27: 0.6 ug/kg/h via INTRAVENOUS
  Administered 2019-05-27: 0.4 ug/kg/h via INTRAVENOUS
  Administered 2019-05-27: 1 ug/kg/h via INTRAVENOUS
  Administered 2019-05-28 – 2019-05-29 (×5): 0.6 ug/kg/h via INTRAVENOUS
  Administered 2019-05-29: 0.4 ug/kg/h via INTRAVENOUS
  Administered 2019-05-29: 0.6 ug/kg/h via INTRAVENOUS
  Administered 2019-05-30: 0.4 ug/kg/h via INTRAVENOUS
  Filled 2019-05-23 (×23): qty 100

## 2019-05-23 MED ORDER — PIVOT 1.5 CAL PO LIQD
1000.0000 mL | ORAL | Status: DC
  Administered 2019-05-23 – 2019-05-24 (×2): 1000 mL
  Filled 2019-05-23: qty 1000

## 2019-05-23 MED ORDER — MIDAZOLAM HCL 2 MG/2ML IJ SOLN
10.0000 mg | Freq: Once | INTRAMUSCULAR | Status: AC
  Administered 2019-05-23: 10 mg via INTRAVENOUS
  Filled 2019-05-23 (×2): qty 10
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
## 2019-05-23 NOTE — Progress Notes (Signed)
Peripherally Inserted Central Catheter/Midline Placement  The IV Nurse has discussed with the patient and/or persons authorized to consent for the patient, the purpose of this procedure and the potential benefits and risks involved with this procedure.  The benefits include less needle sticks, lab draws from the catheter, and the patient may be discharged home with the catheter. Risks include, but not limited to, infection, bleeding, blood clot (thrombus formation), and puncture of an artery; nerve damage and irregular heartbeat and possibility to perform a PICC exchange if needed/ordered by physician.  Alternatives to this procedure were also discussed.  Bard Power PICC patient education guide, fact sheet on infection prevention and patient information card has been provided to patient /or left at bedside.    PICC/Midline Placement Documentation  PICC Triple Lumen 05/23/19 PICC Right Cephalic 43 cm 0 cm (Active)  Indication for Insertion or Continuance of Line Prolonged intravenous therapies 05/23/19 1800  Exposed Catheter (cm) 0 cm 05/23/19 1800  Site Assessment Clean;Dry;Intact 05/23/19 1800  Lumen #1 Status Flushed;Saline locked;Blood return noted 05/23/19 1800  Lumen #2 Status Flushed;Saline locked;Blood return noted 05/23/19 1800  Lumen #3 Status Flushed;Saline locked;Blood return noted 05/23/19 1800  Dressing Type Transparent;Securing device 05/23/19 1800  Dressing Status Clean;Dry;Intact;Antimicrobial disc in place 05/23/19 1800  Dressing Change Due 05/30/19 05/23/19 1800       Darlyn Read 05/23/2019, 6:26 PM

## 2019-05-23 NOTE — Progress Notes (Signed)
Subjective:2 31 Days Post-Op Procedure(s) (LRB): INCISION AND DRAINAGE Left lower leg wounds. (Left) Open Reduction Internal Fixation (Orif) Medial Malleolus Fracture. (Right) Patient reports pain as patient intubated.    Objective: Vital signs in last 24 hours: Temp:  [97.6 F (36.4 C)-99.6 F (37.6 C)] 98.9 F (37.2 C) (10/12 0400) Pulse Rate:  [76-131] 102 (10/12 0600) Resp:  [9-34] 28 (10/12 0600) BP: (84-130)/(38-74) 97/57 (10/12 0600) SpO2:  [93 %-100 %] 97 % (10/12 0600) FiO2 (%):  [40 %] 40 % (10/12 0404) Weight:  [82.5 kg] 82.5 kg (10/12 0500)  Intake/Output from previous day: 10/11 0701 - 10/12 0700 In: 1475.5 [I.V.:1475.5] Out: 1605 [Urine:1425; Chest Tube:180] Intake/Output this shift: No intake/output data recorded.  Recent Labs    05/21/19 0421 05/22/19 0540 05/23/19 0440  HGB 8.1* 7.6* 7.6*   Recent Labs    05/22/19 0540 05/23/19 0440  WBC 16.6* 17.6*  RBC 2.60* 2.64*  HCT 24.3* 24.9*  PLT 529* 572*   Recent Labs    05/22/19 0540 05/23/19 0440  NA 134* 135  K 4.4 4.2  CL 93* 94*  CO2 31 32  BUN 17 15  CREATININE 0.34* 0.35*  GLUCOSE 155* 104*  CALCIUM 7.8* 8.1*   Recent Labs    05/22/19 0540 05/23/19 0440  INR 1.1 1.2    Intact pulses distally Incision: dressing C/D/I No cellulitis present Compartment soft   Assessment/Plan: 31 Days Post-Op Procedure(s) (LRB): INCISION AND DRAINAGE Left lower leg wounds. (Left) Open Reduction Internal Fixation (Orif) Medial Malleolus Fracture. (Right) Up with therapy NWB RLE in cam walker Stitches removed by me today New bandage applied I have ordered portable xrays of the right ankle     Aundra Dubin 05/23/2019, 7:09 AM

## 2019-05-23 NOTE — Consult Note (Signed)
Early Nurse wound follow up   Pt has a full thickness post-op wound to left anterior calf with A-cell applied.  VAC dressing change every 5 days.   Measures: 5X2.5 X.1cm, yellow woundbed, no odor, small amt yellow drainage in the cannister.  Healing full thickness abrasion to left calf nearby is red, dry, and healing.  Sutures to left anterior calf nearby are intact and well-approximated.  Dressing procedure/placement/frequency: Applied Mepital contact layer, then one piece of black foam to 165mm cont suction. Change every 5 days.  Will change again Friday.  [facility] team will follow.  Domenic Moras MSN, RN, FNP-BC CWON Wound, Ostomy, Continence Nurse Pager [phone]

## 2019-05-23 NOTE — Progress Notes (Signed)
Luke Brilliant, RN concerning placement of PICC to replace CVC. Unable to be placed today. Patient not able to sign consent. Consent to be obtained from significant other(Luke Choi) at [phone]. Significant other typically visits daily at approximately 1000.
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
## 2019-05-23 NOTE — Procedures (Addendum)
   Operative Note  Date: 05/23/2019  Procedure: percutaneous tracheostomy without bronchoscopic assistance  Pre-op diagnosis: prolonged mechanical ventilation Post-op diagnosis: same  Surgeon: Jesusita Oka, MD Assistant: Brigid Re  Anesthesia: MAC--10versed, 223fntanyl, 40propofol, 100rocuronium  Findings:  Specimen: none  EBL: <5cc   Drains/Implants:#8 Shiley cuffed,  tracheostomy tube  Disposition: ICU  Description of procedure: The patient was positioned supine with a shoulder roll. Time-out was performed verifying correct patient, procedure, and signature of informed consent. MAC induction was uneventful and the patient was confirmed to be on 100% FiO2. The neck was prepped and draped in the usual sterile fashion. Palpation of neck anatomy was performed. A longitudinal incision was made in the neck and deepened down to the trachea. The pilot balloon was deflated and the endotracheal tube slowly retracted proximally until the tip of the endotracheal tube was palpated just below the cricoid cartilage. An introducer needle was inserted between the second and third tracheal rings and air aspirated after removal of the needle form the sheath. Bubbles were noted on instillation of saline in the sheath. A guidewire was passed through the sheath and the needle removed. Serial dilation was performed and a #8 cuffed Shiley and stylet were inserted over the guidewire and the guidewire and stylet removed. The inner cannula was inserted, the pilot balloon on the tracheostomy inflated, and the ventilator tubing disconnected from the endotracheal tube and connected to the tracheostomy. Chest rise and return tidal volumes were confirmed. The tracheostomy tube was sutured in four quadrants and a tracheostomy tie applied to the neck. The endotracheal tube was removed. The patient tolerated the procedure well. There were no complications.Follow up chest xray was ordered.    AJesusita Oka MD General and [facility]

## 2019-05-24 ENCOUNTER — Inpatient Hospital Stay (HOSPITAL_COMMUNITY): Payer: BC Managed Care – PPO

## 2019-05-24 LAB — GLUCOSE, CAPILLARY
Glucose-Capillary: 128 mg/dL — ABNORMAL HIGH (ref 70–99)
Glucose-Capillary: 131 mg/dL — ABNORMAL HIGH (ref 70–99)
Glucose-Capillary: 133 mg/dL — ABNORMAL HIGH (ref 70–99)
Glucose-Capillary: 136 mg/dL — ABNORMAL HIGH (ref 70–99)
Glucose-Capillary: 137 mg/dL — ABNORMAL HIGH (ref 70–99)
Glucose-Capillary: 144 mg/dL — ABNORMAL HIGH (ref 70–99)

## 2019-05-24 LAB — CBC
HCT: 25.8 % — ABNORMAL LOW (ref 39.0–52.0)
Hemoglobin: 8 g/dL — ABNORMAL LOW (ref 13.0–17.0)
MCH: 28.8 pg (ref 26.0–34.0)
MCHC: 31 g/dL (ref 30.0–36.0)
MCV: 92.8 fL (ref 80.0–100.0)
Platelets: 578 10*3/uL — ABNORMAL HIGH (ref 150–400)
RBC: 2.78 MIL/uL — ABNORMAL LOW (ref 4.22–5.81)
RDW: 14.9 % (ref 11.5–15.5)
WBC: 20 10*3/uL — ABNORMAL HIGH (ref 4.0–10.5)
nRBC: 0.1 % (ref 0.0–0.2)

## 2019-05-24 LAB — MAGNESIUM: Magnesium: 1.6 mg/dL — ABNORMAL LOW (ref 1.7–2.4)

## 2019-05-24 LAB — BASIC METABOLIC PANEL
Anion gap: 10 (ref 5–15)
BUN: 17 mg/dL (ref 6–20)
CO2: 31 mmol/L (ref 22–32)
Calcium: 7.9 mg/dL — ABNORMAL LOW (ref 8.9–10.3)
Chloride: 97 mmol/L — ABNORMAL LOW (ref 98–111)
Creatinine, Ser: <0.3 mg/dL — ABNORMAL LOW (ref 0.61–1.24)
Glucose, Bld: 125 mg/dL — ABNORMAL HIGH (ref 70–99)
Potassium: 3.8 mmol/L (ref 3.5–5.1)
Sodium: 138 mmol/L (ref 135–145)

## 2019-05-24 LAB — PHOSPHORUS: Phosphorus: 2.9 mg/dL (ref 2.5–4.6)

## 2019-05-24 LAB — TRIGLYCERIDES: Triglycerides: 121 mg/dL (ref <150)

## 2019-05-24 MED ORDER — MAGNESIUM SULFATE 2 GM/50ML IV SOLN
2.0000 g | Freq: Once | INTRAVENOUS | Status: AC
  Administered 2019-05-24: 2 g via INTRAVENOUS
  Filled 2019-05-24: qty 50

## 2019-05-24 MED ORDER — QUETIAPINE FUMARATE 100 MG PO TABS
100.0000 mg | ORAL_TABLET | Freq: Two times a day (BID) | ORAL | Status: DC
  Filled 2019-05-24: qty 1

## 2019-05-24 MED ORDER — MORPHINE SULFATE (PF) 2 MG/ML IV SOLN
2.0000 mg | Freq: Four times a day (QID) | INTRAVENOUS | Status: DC | PRN
  Administered 2019-05-24 – 2019-06-01 (×14): 2 mg via INTRAVENOUS
  Filled 2019-05-24 (×15): qty 1

## 2019-05-24 MED ORDER — QUETIAPINE FUMARATE 100 MG PO TABS
100.0000 mg | ORAL_TABLET | Freq: Two times a day (BID) | ORAL | Status: DC
  Administered 2019-05-24 – 2019-05-30 (×13): 100 mg
  Filled 2019-05-24 (×12): qty 1

## 2019-05-24 MED ORDER — PIVOT 1.5 CAL PO LIQD
1000.0000 mL | ORAL | Status: DC
  Administered 2019-05-25 – 2019-06-03 (×8): 1000 mL
  Filled 2019-05-24 (×18): qty 1000

## 2019-05-24 MED ORDER — CLONAZEPAM 1 MG PO TABS
1.0000 mg | ORAL_TABLET | Freq: Two times a day (BID) | ORAL | Status: DC
  Administered 2019-05-24: 1 mg
  Filled 2019-05-24: qty 1

## 2019-05-24 MED ORDER — QUETIAPINE FUMARATE 100 MG PO TABS: 100.0000 mg | ORAL_TABLET | Freq: Two times a day (BID) | ORAL | Status: DC

## 2019-05-24 MED ORDER — CLONAZEPAM 0.1 MG/ML ORAL SUSPENSION
1.0000 mg | Freq: Two times a day (BID) | ORAL | Status: DC
  Filled 2019-05-24: qty 10

## 2019-05-24 MED ORDER — FENTANYL CITRATE (PF) 100 MCG/2ML IJ SOLN
50.0000 ug | INTRAMUSCULAR | Status: DC | PRN
  Administered 2019-05-24 (×2): 75 ug via INTRAVENOUS
  Filled 2019-05-24 (×2): qty 2

## 2019-05-24 MED ORDER — FUROSEMIDE 10 MG/ML IJ SOLN
INTRAMUSCULAR | Status: AC
  Administered 2019-05-24: 80 mg via INTRAVENOUS
  Filled 2019-05-24: qty 8

## 2019-05-24 MED ORDER — FUROSEMIDE 10 MG/ML IJ SOLN
80.0000 mg | Freq: Once | INTRAMUSCULAR | Status: AC
  Administered 2019-05-24: 16:00:00 80 mg via INTRAVENOUS

## 2019-05-24 MED ORDER — FUROSEMIDE 10 MG/ML IJ SOLN
40.0000 mg | Freq: Once | INTRAMUSCULAR | Status: AC
  Administered 2019-05-24: 40 mg via INTRAVENOUS
  Filled 2019-05-24: qty 4
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
## 2019-05-24 NOTE — Progress Notes (Signed)
THERAPY NOTE  Please clarify in orders weight bearing for BIL LE.   Thank you therapy   Jeri Modena, OTR/L  Acute Rehabilitation Services Pager: [phone] Office: [phone] .
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
## 2019-05-24 NOTE — Progress Notes (Signed)
Pt weaned for around 3 hours before becoming tachypnic and HR in the 140s.Pt flipped back to full support on vent
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
## 2019-05-25 ENCOUNTER — Inpatient Hospital Stay (HOSPITAL_COMMUNITY): Payer: BC Managed Care – PPO

## 2019-05-25 LAB — GLUCOSE, CAPILLARY
Glucose-Capillary: 111 mg/dL — ABNORMAL HIGH (ref 70–99)
Glucose-Capillary: 116 mg/dL — ABNORMAL HIGH (ref 70–99)
Glucose-Capillary: 118 mg/dL — ABNORMAL HIGH (ref 70–99)
Glucose-Capillary: 132 mg/dL — ABNORMAL HIGH (ref 70–99)
Glucose-Capillary: 139 mg/dL — ABNORMAL HIGH (ref 70–99)
Glucose-Capillary: 146 mg/dL — ABNORMAL HIGH (ref 70–99)

## 2019-05-25 LAB — CBC
HCT: 26.8 % — ABNORMAL LOW (ref 39.0–52.0)
Hemoglobin: 8.5 g/dL — ABNORMAL LOW (ref 13.0–17.0)
MCH: 29.3 pg (ref 26.0–34.0)
MCHC: 31.7 g/dL (ref 30.0–36.0)
MCV: 92.4 fL (ref 80.0–100.0)
Platelets: 563 10*3/uL — ABNORMAL HIGH (ref 150–400)
RBC: 2.9 MIL/uL — ABNORMAL LOW (ref 4.22–5.81)
RDW: 15.6 % — ABNORMAL HIGH (ref 11.5–15.5)
WBC: 21.4 10*3/uL — ABNORMAL HIGH (ref 4.0–10.5)
nRBC: 0 % (ref 0.0–0.2)

## 2019-05-25 LAB — BASIC METABOLIC PANEL
Anion gap: 11 (ref 5–15)
BUN: 18 mg/dL (ref 6–20)
CO2: 29 mmol/L (ref 22–32)
Calcium: 7.8 mg/dL — ABNORMAL LOW (ref 8.9–10.3)
Chloride: 98 mmol/L (ref 98–111)
Creatinine, Ser: 0.39 mg/dL — ABNORMAL LOW (ref 0.61–1.24)
GFR calc Af Amer: >60 mL/min (ref >60)
GFR calc non Af Amer: >60 mL/min (ref >60)
Glucose, Bld: 139 mg/dL — ABNORMAL HIGH (ref 70–99)
Potassium: 3 mmol/L — ABNORMAL LOW (ref 3.5–5.1)
Sodium: 138 mmol/L (ref 135–145)

## 2019-05-25 LAB — PHOSPHORUS: Phosphorus: 2.9 mg/dL (ref 2.5–4.6)

## 2019-05-25 LAB — MAGNESIUM: Magnesium: 2.1 mg/dL (ref 1.7–2.4)

## 2019-05-25 MED ORDER — SODIUM CHLORIDE 0.9 % IV SOLN
1.0000 g | Freq: Three times a day (TID) | INTRAVENOUS | Status: DC
  Administered 2019-05-25 – 2019-05-26 (×3): 1 g via INTRAVENOUS
  Filled 2019-05-25 (×5): qty 1

## 2019-05-25 MED ORDER — VITAMIN B-1 100 MG PO TABS
100.0000 mg | ORAL_TABLET | Freq: Every day | ORAL | Status: DC
  Administered 2019-05-25 – 2019-06-08 (×15): 100 mg
  Filled 2019-05-25 (×15): qty 1

## 2019-05-25 MED ORDER — MIDAZOLAM HCL 2 MG/2ML IJ SOLN: 2.0000 mg | Freq: Once | INTRAMUSCULAR | Status: DC

## 2019-05-25 MED ORDER — FOLIC ACID 1 MG PO TABS
1.0000 mg | ORAL_TABLET | Freq: Every day | ORAL | Status: DC
  Administered 2019-05-25 – 2019-06-08 (×15): 1 mg
  Filled 2019-05-25 (×15): qty 1

## 2019-05-25 MED ORDER — POTASSIUM CHLORIDE 20 MEQ/15ML (10%) PO SOLN
40.0000 meq | ORAL | Status: AC
  Administered 2019-05-25 (×2): 40 meq via ORAL
  Filled 2019-05-25 (×2): qty 30
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
## 2019-05-26 LAB — CBC
HCT: 27.7 % — ABNORMAL LOW (ref 39.0–52.0)
Hemoglobin: 8.5 g/dL — ABNORMAL LOW (ref 13.0–17.0)
MCH: 29 pg (ref 26.0–34.0)
MCHC: 30.7 g/dL (ref 30.0–36.0)
MCV: 94.5 fL (ref 80.0–100.0)
Platelets: 576 10*3/uL — ABNORMAL HIGH (ref 150–400)
RBC: 2.93 MIL/uL — ABNORMAL LOW (ref 4.22–5.81)
RDW: 16.2 % — ABNORMAL HIGH (ref 11.5–15.5)
WBC: 21 10*3/uL — ABNORMAL HIGH (ref 4.0–10.5)
nRBC: 0 % (ref 0.0–0.2)

## 2019-05-26 LAB — BASIC METABOLIC PANEL
Anion gap: 11 (ref 5–15)
BUN: 20 mg/dL (ref 6–20)
CO2: 25 mmol/L (ref 22–32)
Calcium: 8.3 mg/dL — ABNORMAL LOW (ref 8.9–10.3)
Chloride: 104 mmol/L (ref 98–111)
Creatinine, Ser: <0.3 mg/dL — ABNORMAL LOW (ref 0.61–1.24)
Glucose, Bld: 136 mg/dL — ABNORMAL HIGH (ref 70–99)
Potassium: 3.8 mmol/L (ref 3.5–5.1)
Sodium: 140 mmol/L (ref 135–145)

## 2019-05-26 LAB — GLUCOSE, CAPILLARY
Glucose-Capillary: 110 mg/dL — ABNORMAL HIGH (ref 70–99)
Glucose-Capillary: 113 mg/dL — ABNORMAL HIGH (ref 70–99)
Glucose-Capillary: 121 mg/dL — ABNORMAL HIGH (ref 70–99)
Glucose-Capillary: 129 mg/dL — ABNORMAL HIGH (ref 70–99)
Glucose-Capillary: 130 mg/dL — ABNORMAL HIGH (ref 70–99)
Glucose-Capillary: 142 mg/dL — ABNORMAL HIGH (ref 70–99)

## 2019-05-26 LAB — PHOSPHORUS: Phosphorus: 3 mg/dL (ref 2.5–4.6)

## 2019-05-26 LAB — MAGNESIUM: Magnesium: 2.1 mg/dL (ref 1.7–2.4)

## 2019-05-26 MED ORDER — SODIUM CHLORIDE 0.9 % IV SOLN
2.0000 g | Freq: Three times a day (TID) | INTRAVENOUS | Status: AC
  Administered 2019-05-26 – 2019-05-30 (×13): 2 g via INTRAVENOUS
  Filled 2019-05-26 (×14): qty 2
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
## 2019-05-26 NOTE — Progress Notes (Signed)
PHARMACY NOTE:  ANTIMICROBIAL RENAL DOSAGE ADJUSTMENT  Current antimicrobial regimen includes a mismatch between antimicrobial dosage and estimated renal function.  As per policy approved by the Pharmacy & Therapeutics and Medical Executive Committees, the antimicrobial dosage will be adjusted accordingly.  Current antimicrobial dosage:  Cefepime 1gm IV Q8H  Indication: PNA  Renal Function:  CrCl cannot be calculated (This lab value cannot be used to calculate CrCl because it is not a number: <0.30). []      On intermittent HD, scheduled: []      On CRRT    Antimicrobial dosage has been changed to:  Cefepime 2gm IV Q8H  Additional comments:  F/u micro   Doris Gruhn D. Mina Marble, PharmD, BCPS, [place] 05/26/2019, 12:39 PM

## 2019-05-26 NOTE — Plan of Care (Signed)
Pt did not wean on ventilator today. Copious yellow/tan secretions. Pt resting more comfortably w/ administration of PRN pain medications and Precedex gtt. Family at bedside, updated on plan of care.

## 2019-05-27 ENCOUNTER — Inpatient Hospital Stay (HOSPITAL_COMMUNITY): Payer: BC Managed Care – PPO

## 2019-05-27 LAB — CULTURE, RESPIRATORY W GRAM STAIN

## 2019-05-27 LAB — BASIC METABOLIC PANEL
Anion gap: 7 (ref 5–15)
BUN: 22 mg/dL — ABNORMAL HIGH (ref 6–20)
CO2: 26 mmol/L (ref 22–32)
Calcium: 8.1 mg/dL — ABNORMAL LOW (ref 8.9–10.3)
Chloride: 107 mmol/L (ref 98–111)
Creatinine, Ser: 0.32 mg/dL — ABNORMAL LOW (ref 0.61–1.24)
GFR calc Af Amer: >60 mL/min (ref >60)
GFR calc non Af Amer: >60 mL/min (ref >60)
Glucose, Bld: 133 mg/dL — ABNORMAL HIGH (ref 70–99)
Potassium: 3.7 mmol/L (ref 3.5–5.1)
Sodium: 140 mmol/L (ref 135–145)

## 2019-05-27 LAB — GLUCOSE, CAPILLARY
Glucose-Capillary: 105 mg/dL — ABNORMAL HIGH (ref 70–99)
Glucose-Capillary: 113 mg/dL — ABNORMAL HIGH (ref 70–99)
Glucose-Capillary: 113 mg/dL — ABNORMAL HIGH (ref 70–99)
Glucose-Capillary: 119 mg/dL — ABNORMAL HIGH (ref 70–99)
Glucose-Capillary: 120 mg/dL — ABNORMAL HIGH (ref 70–99)
Glucose-Capillary: 132 mg/dL — ABNORMAL HIGH (ref 70–99)

## 2019-05-27 LAB — CBC
HCT: 26.4 % — ABNORMAL LOW (ref 39.0–52.0)
Hemoglobin: 8.1 g/dL — ABNORMAL LOW (ref 13.0–17.0)
MCH: 29.5 pg (ref 26.0–34.0)
MCHC: 30.7 g/dL (ref 30.0–36.0)
MCV: 96 fL (ref 80.0–100.0)
Platelets: 531 10*3/uL — ABNORMAL HIGH (ref 150–400)
RBC: 2.75 MIL/uL — ABNORMAL LOW (ref 4.22–5.81)
RDW: 16.5 % — ABNORMAL HIGH (ref 11.5–15.5)
WBC: 16 10*3/uL — ABNORMAL HIGH (ref 4.0–10.5)
nRBC: 0 % (ref 0.0–0.2)

## 2019-05-27 MED ORDER — APIXABAN 5 MG PO TABS
5.0000 mg | ORAL_TABLET | Freq: Two times a day (BID) | ORAL | Status: DC
  Administered 2019-05-27 – 2019-05-28 (×3): 5 mg via ORAL
  Filled 2019-05-27 (×4): qty 1

## 2019-05-27 NOTE — Consult Note (Signed)
[place] Nurse wound follow up Wound type: I & D site.  NPWT (VAC) therapy stopped on Friday and continuing vaseline gauze.  Spoke with plastics Rodman [place], [place]) who agrees to leave VAC off until Monday and will determine if we need to re-start.  If re-started, will need to order new equipment set up.  Measurement: 5 cm x 2.5 cm x 0.1 cm with yellow wound bed (ACELL incorporating into wound bed) Wound bed:see above Drainage (amount, consistency, odor) minimal serosanguinous  Periwound:bruising, abrasion to left calf remain.  Sutures present Dressing procedure/placement/frequency: Cleanse wound to left anterior lower leg with NS and pat dry. Apply vaseline to wound bed. Cover with mepitel gauze for atraumatic dressing removal and then dry gauze.  Secure with kerlix and tape.  Change daily.  Bedside RN to perform. .  [place] team will follow through [place].  If NPWT is not re-started, we will no longer follow unless needed.  Domenic Moras MSN, RN, FNP-BC CWON Wound, Ostomy, Continence Nurse Pager [phone]

## 2019-05-27 NOTE — Discharge Instructions (Addendum)
Postoperative instructions:  Weightbearing instructions: as tolerated to both legs  Dressing instructions: Keep your dressing and/or splint clean and dry at all times.  It will be removed at your first post-operative appointment.  Your stitches and/or staples will be removed at this visit.  Incision instructions:  Do not soak your incision for 3 weeks after surgery.  If the incision gets wet, pat dry and do not scrub the incision.  Pain control:  You have been given a prescription to be taken as directed for post-operative pain control.  In addition, elevate the operative extremity above the heart at all times to prevent swelling and throbbing pain.  Take over-the-counter Colace, 100mg  by mouth twice a day while taking narcotic pain medications to help prevent constipation.  Follow up appointments: 1) 14 days for suture removal and wound check. 2) Dr. as scheduled.   -------------------------------------------------------------------------------------------------------------  After Surgery Pain Control:  After your surgery, post-surgical discomfort or pain is likely. This discomfort can last several days to a few weeks. At certain times of the day your discomfort may be more intense.  Did you receive a nerve block?  A nerve block can provide pain relief for one hour to two days after your surgery. As long as the nerve block is working, you will experience little or no sensation in the area the surgeon operated on.  As the nerve block wears off, you will begin to experience pain or discomfort. It is very important that you begin taking your prescribed pain medication before the nerve block fully wears off. Treating your pain at the first sign of the block wearing off will ensure your pain is better controlled and more tolerable when full-sensation returns. Do not wait until the pain is intolerable, as the medicine will be less effective. It is better to treat pain in advance than to try  and catch up.  General Anesthesia:  If you did not receive a nerve block during your surgery, you will need to start taking your pain medication shortly after your surgery and should continue to do so as prescribed by your surgeon.  Pain Medication:  Most commonly we prescribe Vicodin and Percocet for post-operative pain. Both of these medications contain a combination of acetaminophen (Tylenol) and a narcotic to help control pain.   It takes between 30 and 45 minutes before pain medication starts to work. It is important to take your medication before your pain level gets too intense.   Nausea is a common side effect of many pain medications. You will want to eat something before taking your pain medicine to help prevent nausea.   If you are taking a prescription pain medication that contains acetaminophen, we recommend that you do not take additional over the counter acetaminophen (Tylenol).  Other pain relieving options:   Using a cold pack to ice the affected area a few times a day (15 to 20 minutes at a time) can help to relieve pain, reduce swelling and bruising.   Elevation of the affected area can also help to reduce pain and swelling.   Information on my medicine - ELIQUIS (apixaban)  This medication education was reviewed with me or my healthcare representative as part of my discharge preparation.   Why was Eliquis prescribed for you? Eliquis was prescribed to treat blood clots that may have been found in the veins of your legs (deep vein thrombosis) or in your lungs (pulmonary embolism) and to reduce the risk of them occurring again.  What do You need to know about Eliquis ? The dose is ONE 5 mg tablet taken TWICE daily.  Eliquis may be taken with or without food.   Try to take the dose about the same time in the morning and in the evening. If you have difficulty swallowing the tablet whole please discuss with your pharmacist how to take the medication safely.  Take  Eliquis exactly as prescribed and DO NOT stop taking Eliquis without talking to the doctor who prescribed the medication.  Stopping may increase your risk of developing a new blood clot.  Refill your prescription before you run out.  After discharge, you should have regular check-up appointments with your healthcare provider that is prescribing your Eliquis.    What do you do if you miss a dose? If a dose of ELIQUIS is not taken at the scheduled time, take it as soon as possible on the same day and twice-daily administration should be resumed. The dose should not be doubled to make up for a missed dose.  Important Safety Information A possible side effect of Eliquis is bleeding. You should call your healthcare provider right away if you experience any of the following: ? Bleeding from an injury or your nose that does not stop. ? Unusual colored urine (red or dark brown) or unusual colored stools (red or black). ? Unusual bruising for unknown reasons. ? A serious fall or if you hit your head (even if there is no bleeding).  Some medicines may interact with Eliquis and might increase your risk of bleeding or clotting while on Eliquis. To help avoid this, consult your healthcare provider or pharmacist prior to using any new prescription or non-prescription medications, including herbals, vitamins, non-steroidal anti-inflammatory drugs (NSAIDs) and supplements.  This website has more information on Eliquis (apixaban): http://www.eliquis.com/eliquis/home

## 2019-05-27 NOTE — Plan of Care (Signed)
Pt moving all extremities to command. Able to move upper extremities against gravity, left lower extremity against resistance. Flicker in right lower extremity. Pt attempting to sit forward, able to look around and move head fairly well. Pt mouthed word "yes" when asked questions. Family member at bedside.
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
## 2019-05-27 NOTE — TOC Progression Note (Signed)
Transition of Care [facility]) - Progression Note    Patient Details  Name: Luke Choi MRN: [id number] Date of Birth: March 28, 1971  [facility] CM/SW Contact  [name], RN Phone Number: 05/27/2019, 5:15 PM  Clinical Narrative:  Completed FMLA paperwork for fiance and faxed to her employer.  [employer] emailed to her.  Referral to [facility] today for possible LTAC. Will need insurance auth prior to admission, if accepted; could take up to a week or more.       Expected Discharge Plan: Long Term Acute Care (LTAC) Barriers to Discharge: Continued Medical Work up, Ship broker  Expected Discharge Plan and Services Expected Discharge Plan: [place] (LTAC)   Discharge Planning Services: CM Consult   Living arrangements for the past 2 months: [place], RN, BSN  Trauma/Neuro ICU Case Manager [phone]
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
## 2019-05-28 LAB — BASIC METABOLIC PANEL
Anion gap: 9 (ref 5–15)
BUN: 22 mg/dL — ABNORMAL HIGH (ref 6–20)
CO2: 26 mmol/L (ref 22–32)
Calcium: 8.3 mg/dL — ABNORMAL LOW (ref 8.9–10.3)
Chloride: 105 mmol/L (ref 98–111)
Creatinine, Ser: <0.3 mg/dL — ABNORMAL LOW (ref 0.61–1.24)
Glucose, Bld: 122 mg/dL — ABNORMAL HIGH (ref 70–99)
Potassium: 4 mmol/L (ref 3.5–5.1)
Sodium: 140 mmol/L (ref 135–145)

## 2019-05-28 LAB — GLUCOSE, CAPILLARY
Glucose-Capillary: 114 mg/dL — ABNORMAL HIGH (ref 70–99)
Glucose-Capillary: 117 mg/dL — ABNORMAL HIGH (ref 70–99)
Glucose-Capillary: 122 mg/dL — ABNORMAL HIGH (ref 70–99)
Glucose-Capillary: 130 mg/dL — ABNORMAL HIGH (ref 70–99)
Glucose-Capillary: 139 mg/dL — ABNORMAL HIGH (ref 70–99)
Glucose-Capillary: 147 mg/dL — ABNORMAL HIGH (ref 70–99)

## 2019-05-28 LAB — CBC
HCT: 25.8 % — ABNORMAL LOW (ref 39.0–52.0)
Hemoglobin: 7.9 g/dL — ABNORMAL LOW (ref 13.0–17.0)
MCH: 29.2 pg (ref 26.0–34.0)
MCHC: 30.6 g/dL (ref 30.0–36.0)
MCV: 95.2 fL (ref 80.0–100.0)
Platelets: 499 10*3/uL — ABNORMAL HIGH (ref 150–400)
RBC: 2.71 MIL/uL — ABNORMAL LOW (ref 4.22–5.81)
RDW: 16.2 % — ABNORMAL HIGH (ref 11.5–15.5)
WBC: 13.2 10*3/uL — ABNORMAL HIGH (ref 4.0–10.5)
nRBC: 0 % (ref 0.0–0.2)
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
## 2019-05-29 LAB — GLUCOSE, CAPILLARY
Glucose-Capillary: 111 mg/dL — ABNORMAL HIGH (ref 70–99)
Glucose-Capillary: 116 mg/dL — ABNORMAL HIGH (ref 70–99)
Glucose-Capillary: 118 mg/dL — ABNORMAL HIGH (ref 70–99)
Glucose-Capillary: 118 mg/dL — ABNORMAL HIGH (ref 70–99)
Glucose-Capillary: 120 mg/dL — ABNORMAL HIGH (ref 70–99)

## 2019-05-29 LAB — CBC
HCT: 27.1 % — ABNORMAL LOW (ref 39.0–52.0)
Hemoglobin: 8.2 g/dL — ABNORMAL LOW (ref 13.0–17.0)
MCH: 28.7 pg (ref 26.0–34.0)
MCHC: 30.3 g/dL (ref 30.0–36.0)
MCV: 94.8 fL (ref 80.0–100.0)
Platelets: 503 10*3/uL — ABNORMAL HIGH (ref 150–400)
RBC: 2.86 MIL/uL — ABNORMAL LOW (ref 4.22–5.81)
RDW: 16 % — ABNORMAL HIGH (ref 11.5–15.5)
WBC: 16.1 10*3/uL — ABNORMAL HIGH (ref 4.0–10.5)
nRBC: 0 % (ref 0.0–0.2)

## 2019-05-29 MED ORDER — APIXABAN 5 MG PO TABS: 5.0000 mg | ORAL_TABLET | Freq: Two times a day (BID) | ORAL | Status: DC

## 2019-05-29 MED ORDER — APIXABAN 5 MG PO TABS
5.0000 mg | ORAL_TABLET | Freq: Two times a day (BID) | ORAL | Status: DC
  Administered 2019-05-29 – 2019-06-08 (×20): 5 mg
  Filled 2019-05-29 (×19): qty 1
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
## 2019-05-30 ENCOUNTER — Inpatient Hospital Stay (HOSPITAL_COMMUNITY): Payer: BC Managed Care – PPO

## 2019-05-30 LAB — BASIC METABOLIC PANEL
Anion gap: 10 (ref 5–15)
BUN: 20 mg/dL (ref 6–20)
CO2: 25 mmol/L (ref 22–32)
Calcium: 8.6 mg/dL — ABNORMAL LOW (ref 8.9–10.3)
Chloride: 101 mmol/L (ref 98–111)
Creatinine, Ser: 0.42 mg/dL — ABNORMAL LOW (ref 0.61–1.24)
GFR calc Af Amer: >60 mL/min (ref >60)
GFR calc non Af Amer: >60 mL/min (ref >60)
Glucose, Bld: 131 mg/dL — ABNORMAL HIGH (ref 70–99)
Potassium: 4 mmol/L (ref 3.5–5.1)
Sodium: 136 mmol/L (ref 135–145)

## 2019-05-30 LAB — CBC WITH DIFFERENTIAL/PLATELET
Abs Immature Granulocytes: 0.14 10*3/uL — ABNORMAL HIGH (ref 0.00–0.07)
Basophils Absolute: 0.1 10*3/uL (ref 0.0–0.1)
Basophils Relative: 1 %
Eosinophils Absolute: 0.4 10*3/uL (ref 0.0–0.5)
Eosinophils Relative: 2 %
HCT: 27.9 % — ABNORMAL LOW (ref 39.0–52.0)
Hemoglobin: 8.8 g/dL — ABNORMAL LOW (ref 13.0–17.0)
Immature Granulocytes: 1 %
Lymphocytes Relative: 6 %
Lymphs Abs: 1 10*3/uL (ref 0.7–4.0)
MCH: 29.2 pg (ref 26.0–34.0)
MCHC: 31.5 g/dL (ref 30.0–36.0)
MCV: 92.7 fL (ref 80.0–100.0)
Monocytes Absolute: 1.3 10*3/uL — ABNORMAL HIGH (ref 0.1–1.0)
Monocytes Relative: 8 %
Neutro Abs: 13.9 10*3/uL — ABNORMAL HIGH (ref 1.7–7.7)
Neutrophils Relative %: 82 %
Platelets: 551 10*3/uL — ABNORMAL HIGH (ref 150–400)
RBC: 3.01 MIL/uL — ABNORMAL LOW (ref 4.22–5.81)
RDW: 15.9 % — ABNORMAL HIGH (ref 11.5–15.5)
WBC: 16.7 10*3/uL — ABNORMAL HIGH (ref 4.0–10.5)
nRBC: 0 % (ref 0.0–0.2)

## 2019-05-30 LAB — GLUCOSE, CAPILLARY
Glucose-Capillary: 103 mg/dL — ABNORMAL HIGH (ref 70–99)
Glucose-Capillary: 105 mg/dL — ABNORMAL HIGH (ref 70–99)
Glucose-Capillary: 114 mg/dL — ABNORMAL HIGH (ref 70–99)
Glucose-Capillary: 117 mg/dL — ABNORMAL HIGH (ref 70–99)
Glucose-Capillary: 120 mg/dL — ABNORMAL HIGH (ref 70–99)
Glucose-Capillary: 125 mg/dL — ABNORMAL HIGH (ref 70–99)
Glucose-Capillary: 91 mg/dL (ref 70–99)

## 2019-05-30 LAB — PHOSPHORUS: Phosphorus: 3.3 mg/dL (ref 2.5–4.6)

## 2019-05-30 LAB — MAGNESIUM: Magnesium: 2.1 mg/dL (ref 1.7–2.4)

## 2019-05-30 MED ORDER — QUETIAPINE FUMARATE 50 MG PO TABS
75.0000 mg | ORAL_TABLET | Freq: Two times a day (BID) | ORAL | Status: DC
  Administered 2019-05-31 – 2019-06-01 (×3): 75 mg
  Filled 2019-05-30: qty 2
  Filled 2019-05-30: qty 3
  Filled 2019-05-30: qty 2

## 2019-05-30 MED ORDER — DOCUSATE SODIUM 50 MG/5ML PO LIQD
100.0000 mg | Freq: Every day | ORAL | Status: DC
  Administered 2019-05-31 – 2019-06-08 (×8): 100 mg
  Filled 2019-05-30 (×9): qty 10

## 2019-05-30 NOTE — Progress Notes (Signed)
Patient pulled out NG tube at shift change. Paged Trauma MD on call to see if we need to replace it or wait for coretrack team tomorrow. Dr. Bobbye Morton said to wait till tomorrow for coretrack team. Will continue to monitor.

## 2019-05-30 NOTE — Consult Note (Signed)
WOC discussed need for NPWT with plastics PA. They will place new consult if NPWT is to be re-ordered.   [address], [place], [place]
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
## 2019-05-30 NOTE — Progress Notes (Signed)
~ 4 weeks from placement of Acell Subjective: Luke Choi is doing well today.  He is off the vent, c-collar removed.  Girlfriend reports that he has been sitting up in the bedside chair.  Possibly moved to stepdown ICU per girlfriend.  No complaints.  His left lower extremity wound is doing really well.  Incorporating ACell.  No surrounding erythema, sign of infection.  No purulent drainage.  Objective: Vital signs in last 24 hours: Temp:  [98.1 F (36.7 C)-99.5 F (37.5 C)] 99.5 F (37.5 C) (10/19 1200) Pulse Rate:  [84-113] 113 (10/19 1206) Resp:  [15-29] 26 (10/19 1206) BP: (102-121)/(58-81) 110/75 (10/19 1206) SpO2:  [97 %-100 %] 98 % (10/19 1206) FiO2 (%):  [35 %-40 %] 35 % (10/19 1206) Last BM Date: 05/30/19  Intake/Output from previous day: 10/18 0701 - 10/19 0700 In: 1497.4 [I.V.:217.4; NG/GT:980; IV Piggyback:300] Out: 800 [Urine:800] Intake/Output this shift: No intake/output data recorded.  General appearance: no distress, gf at bedside Extremities: Boot on RLE, LLE wound is 4.25 cm x 1.2cm and flush with surrounding epithelial tissue. Incorporating Acell. Mild exudate. Sutures being removed from previous I&D by ortho Pulses: 2+ and symmetric   Lab Results:  CBC    Component Value Date/Time   WBC 16.7 (H) 05/30/2019 0647   RBC 3.01 (L) 05/30/2019 0647   HGB 8.8 (L) 05/30/2019 0647   HCT 27.9 (L) 05/30/2019 0647   PLT 551 (H) 05/30/2019 0647   MCV 92.7 05/30/2019 0647   MCH 29.2 05/30/2019 0647   MCHC 31.5 05/30/2019 0647   RDW 15.9 (H) 05/30/2019 0647   LYMPHSABS 1.0 05/30/2019 0647   MONOABS 1.3 (H) 05/30/2019 0647   EOSABS 0.4 05/30/2019 0647   BASOSABS 0.1 05/30/2019 0647    BMET Recent Labs    05/28/19 0459 05/30/19 0647  NA 140 136  K 4.0 4.0  CL 105 101  CO2 26 25  GLUCOSE 122* 131*  BUN 22* 20  CREATININE <0.30* 0.42*  CALCIUM 8.3* 8.6*   PT/INR No results for input(s): LABPROT, INR in the last 72 hours. ABG No results for  input(s): PHART, HCO3 in the last 72 hours.  Invalid input(s): PCO2, PO2  Studies/Results: Dg Chest Port 1 View  Result Date: 05/30/2019 CLINICAL DATA:  Tracheostomy dependent. EXAM: PORTABLE CHEST 1 VIEW COMPARISON:  Radiograph 05/27/2019, most recent CT 04/20/2019 FINDINGS: Tracheostomy tube tip at the thoracic inlet. Enteric tube remains in place. Bilateral multifocal lung opacities, slight increase in the left suprahilar region. Unchanged heart size and mediastinal contours. No large pleural effusion. No visualized pneumothorax. IMPRESSION: 1. Tracheostomy tube tip at the thoracic inlet. 2. Bilateral multifocal lung opacities, slightly progression in the left suprahilar region. Electronically Signed   By: Narda Rutherford M.D.   On: 05/30/2019 06:03   Dg Abd Portable 1v  Result Date: 05/30/2019 CLINICAL DATA:  Check gastric catheter placement EXAM: PORTABLE ABDOMEN - 1 VIEW COMPARISON:  Film from earlier in the same day. FINDINGS: Gastric catheter is been advanced further into the stomach. Scattered large and small bowel gas is noted. No bony abnormality is seen. IMPRESSION: Gastric catheter in satisfactory position. Electronically Signed   By: Alcide Clever M.D.   On: 05/30/2019 12:40   Dg Abd Portable 1v  Result Date: 05/30/2019 CLINICAL DATA:  Encounter for nasogastric (NG) tube placement EXAM: PORTABLE ABDOMEN - 1 VIEW COMPARISON:  Abdominal radiograph 04/28/2019 FINDINGS: The nasogastric tube side port projects in the distal esophagus. The abdomen is partially visualized without evidence of  obstruction. No evidence of free air. Diffuse opacities throughout the bilateral lungs. IMPRESSION: Nasogastric tube side port projects in the distal esophagus. Recommend advancement of approximately 9 cm. Electronically Signed   By: Emmaline KluverNancy  Ballantyne M.D.   On: 05/30/2019 11:03    Anti-infectives: Anti-infectives (From admission, onward)   Start     Dose/Rate Route Frequency Ordered Stop    05/26/19 1600  ceFEPIme (MAXIPIME) 2 g in sodium chloride 0.9 % 100 mL IVPB     2 g 200 mL/hr over 30 Minutes Intravenous Every 8 hours 05/26/19 1239 05/30/19 2359   05/25/19 1600  ceFEPIme (MAXIPIME) 1 g in sodium chloride 0.9 % 100 mL IVPB  Status:  Discontinued     1 g 200 mL/hr over 30 Minutes Intravenous Every 8 hours 05/25/19 1531 05/26/19 1239   05/15/19 1200  sulfamethoxazole-trimethoprim (BACTRIM) 400 mg of trimethoprim in dextrose 5 % 500 mL IVPB     400 mg of trimethoprim 350 mL/hr over 90 Minutes Intravenous Every 8 hours 05/15/19 1131 05/22/19 0611   05/05/19 1400  cefTRIAXone (ROCEPHIN) 2 g in sodium chloride 0.9 % 100 mL IVPB     2 g 200 mL/hr over 30 Minutes Intravenous Every 24 hours 05/05/19 0814 05/11/19 1357   05/04/19 0600  ceFAZolin (ANCEF) IVPB 2g/100 mL premix     2 g 200 mL/hr over 30 Minutes Intravenous On call to O.R. 05/03/19 1202 05/05/19 0559   05/01/19 1630  vancomycin (VANCOCIN) 2,000 mg in sodium chloride 0.9 % 500 mL IVPB  Status:  Discontinued     2,000 mg 250 mL/hr over 120 Minutes Intravenous Every 12 hours 05/01/19 1027 05/05/19 0814   04/29/19 0430  vancomycin (VANCOCIN) 1,250 mg in sodium chloride 0.9 % 250 mL IVPB  Status:  Discontinued     1,250 mg 166.7 mL/hr over 90 Minutes Intravenous Every 12 hours 04/28/19 1600 05/01/19 1027   04/28/19 1630  vancomycin (VANCOCIN) 1,250 mg in sodium chloride 0.9 % 250 mL IVPB  Status:  Discontinued     1,250 mg 166.7 mL/hr over 90 Minutes Intravenous Every 12 hours 04/28/19 1550 04/28/19 1600   04/28/19 1630  vancomycin (VANCOCIN) 2,250 mg in sodium chloride 0.9 % 500 mL IVPB     2,250 mg 250 mL/hr over 120 Minutes Intravenous  Once 04/28/19 1600 04/28/19 1916   04/26/19 1600  ceFEPIme (MAXIPIME) 2 g in sodium chloride 0.9 % 100 mL IVPB  Status:  Discontinued     2 g 200 mL/hr over 30 Minutes Intravenous Every 8 hours 04/26/19 1556 05/05/19 0814   04/22/19 0945  ceFAZolin (ANCEF) IVPB 2g/100 mL premix     Note to Pharmacy: Anesthesia to give preop   2 g 200 mL/hr over 30 Minutes Intravenous  Once 04/22/19 0930 04/22/19 1245   04/22/19 0945  ceFAZolin (ANCEF) IVPB 2g/100 mL premix  Status:  Discontinued     2 g 200 mL/hr over 30 Minutes Intravenous Every 8 hours 04/22/19 0930 04/22/19 1330   04/20/19 2130  penicillin G potassium 2 Million Units in dextrose 5 % 50 mL IVPB     2 Million Units 100 mL/hr over 30 Minutes Intravenous STAT 04/20/19 2104 04/20/19 2216   04/20/19 2130  gentamicin (GARAMYCIN) 400 mg in dextrose 5 % 50 mL IVPB     5 mg/kg  79.8 kg 120 mL/hr over 30 Minutes Intravenous STAT 04/20/19 2115 04/20/19 2228   04/20/19 1900  ceFAZolin (ANCEF) IVPB 2g/100 mL premix     2  g 200 mL/hr over 30 Minutes Intravenous  Once 04/20/19 1851 04/20/19 2036      Assessment/Plan: s/p Procedure(s):  Patient's left lower extremity wound is incorporating ACell nicely.  Good granulation tissue noted.  Continue with Vaseline gauze, K-Y jelly, 4 x 4, Kerlix.   His wound has significantly decreased in size since last evaluation 5 days ago.  It is now 4.25 x 1.25 cm.  No sign of infection.  Continue with daily dressing changes. Do not suspect patient will require skin graft if he begins to epithelialize. Will continue to follow.       LOS: 40 days    Charlies Constable, PA-C 05/30/2019

## 2019-05-30 NOTE — Progress Notes (Signed)
Cortrack tube is clogged, cortrac team unable to place new one until Tues. NG 10 F placed in L nare, X ray recommended advancing 9 cm. Tube advanced, new x rays ordered.
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
## 2019-05-31 LAB — MAGNESIUM: Magnesium: 2 mg/dL (ref 1.7–2.4)

## 2019-05-31 LAB — BASIC METABOLIC PANEL
Anion gap: 12 (ref 5–15)
BUN: 19 mg/dL (ref 6–20)
CO2: 24 mmol/L (ref 22–32)
Calcium: 8.7 mg/dL — ABNORMAL LOW (ref 8.9–10.3)
Chloride: 99 mmol/L (ref 98–111)
Creatinine, Ser: 0.32 mg/dL — ABNORMAL LOW (ref 0.61–1.24)
GFR calc Af Amer: >60 mL/min (ref >60)
GFR calc non Af Amer: >60 mL/min (ref >60)
Glucose, Bld: 107 mg/dL — ABNORMAL HIGH (ref 70–99)
Potassium: 3.7 mmol/L (ref 3.5–5.1)
Sodium: 135 mmol/L (ref 135–145)

## 2019-05-31 LAB — GLUCOSE, CAPILLARY
Glucose-Capillary: 109 mg/dL — ABNORMAL HIGH (ref 70–99)
Glucose-Capillary: 111 mg/dL — ABNORMAL HIGH (ref 70–99)
Glucose-Capillary: 104 mg/dL — ABNORMAL HIGH (ref 70–99)
Glucose-Capillary: 119 mg/dL — ABNORMAL HIGH (ref 70–99)
Glucose-Capillary: 135 mg/dL — ABNORMAL HIGH (ref 70–99)
Glucose-Capillary: 130 mg/dL — ABNORMAL HIGH (ref 70–99)

## 2019-05-31 LAB — PHOSPHORUS: Phosphorus: 3.4 mg/dL (ref 2.5–4.6)

## 2019-05-31 MED ORDER — HYPROMELLOSE (GONIOSCOPIC) 2.5 % OP SOLN
1.0000 [drp] | OPHTHALMIC | Status: DC | PRN
  Filled 2019-05-31 (×2): qty 15

## 2019-05-31 NOTE — Progress Notes (Signed)
Transitions of Care Pharmacist Note  Marlo Goodrich is a 48 y.o. male that has been diagnosed with DVT and will be prescribed Eliquis (apixaban) at discharge.   Patient Education: I provided the following education on Eliquis to the patient's wife via phone call: How to take the medication Described what the medication is Signs of bleeding Signs/symptoms of VTE and stroke  Answered their questions   Of note, the patient's wife, Carolynn Serve, has been a paramedic for 20 years.  Discharge Medications Plan: The patient's wife wants to have the patient's discharge medications filled by the Transitions of Care pharmacy rather than their usual pharmacy.  The discharge orders pharmacy has been changed to the Transitions of Care pharmacy, the patient will receive a phone call regarding co-pay, and their medications will be delivered by the Transitions of Care pharmacy.   Insurance information: N/A   Thank you,   Eddie Candle, PharmD PGY-1 Pharmacy Resident   May 31, 2019

## 2019-05-31 NOTE — Procedures (Signed)
Cortrak  Person Inserting Tube:  Stephaun Million E, RD Tube Type:  Cortrak - 43 inches Tube Location:  Left nare Initial Placement:  Stomach Secured by: Bridle Technique Used to Measure Tube Placement:  Documented cm marking at nare/ corner of mouth Cortrak Secured At:  80 cm   Cortrak Tube Team Note:  Consult received to place a Cortrak feeding tube.   No x-ray is required. RN may begin using tube.   If the tube becomes dislodged please keep the tube and contact the Cortrak team at [email or web address] (password [email or web address]) for replacement.  If after hours and replacement cannot be delayed, place a NG tube and confirm placement with an abdominal x-ray.    Zafar Debrosse, MS, RD, LDN Office: [phone] Pager: [phone] After Hours/Weekend Pager: [phone]    
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
## 2019-05-31 NOTE — Progress Notes (Signed)
Inpatient Rehabilitation-Admissions Coordinator   [facility] will contact MD and request IP Rehab Consult Order at this time.   Jhonnie Garner, OTR/L  Rehab Admissions Coordinator  [phone] 05/31/2019 2:43 PM
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
## 2019-05-31 NOTE — Progress Notes (Signed)
Pt transferred to 4 No P room 10. Bedside handoff given to [employer]. S.O. [name] at bedside.

## 2019-06-01 ENCOUNTER — Inpatient Hospital Stay (HOSPITAL_COMMUNITY): Payer: BC Managed Care – PPO

## 2019-06-01 LAB — BLOOD GAS, ARTERIAL
Acid-Base Excess: 10.6 mmol/L — ABNORMAL HIGH (ref 0.0–2.0)
Bicarbonate: 37.1 mmol/L — ABNORMAL HIGH (ref 20.0–28.0)
Drawn by: 34762
FIO2: 60
MECHVT: 430 mL
O2 Saturation: 96.8 %
PEEP: 14 cmH2O
Patient temperature: 99.9
RATE: 35 resp/min
pCO2 arterial: 79.9 mmHg (ref 32.0–48.0)
pH, Arterial: 7.292 — ABNORMAL LOW (ref 7.350–7.450)
pO2, Arterial: 100 mmHg (ref 83.0–108.0)

## 2019-06-01 LAB — BASIC METABOLIC PANEL
Anion gap: 11 (ref 5–15)
BUN: 18 mg/dL (ref 6–20)
CO2: 24 mmol/L (ref 22–32)
Calcium: 9 mg/dL (ref 8.9–10.3)
Chloride: 100 mmol/L (ref 98–111)
Creatinine, Ser: 0.32 mg/dL — ABNORMAL LOW (ref 0.61–1.24)
GFR calc Af Amer: >60 mL/min (ref >60)
GFR calc non Af Amer: >60 mL/min (ref >60)
Glucose, Bld: 139 mg/dL — ABNORMAL HIGH (ref 70–99)
Potassium: 3.7 mmol/L (ref 3.5–5.1)
Sodium: 135 mmol/L (ref 135–145)

## 2019-06-01 LAB — GLUCOSE, CAPILLARY
Glucose-Capillary: 134 mg/dL — ABNORMAL HIGH (ref 70–99)
Glucose-Capillary: 118 mg/dL — ABNORMAL HIGH (ref 70–99)
Glucose-Capillary: 114 mg/dL — ABNORMAL HIGH (ref 70–99)
Glucose-Capillary: 114 mg/dL — ABNORMAL HIGH (ref 70–99)
Glucose-Capillary: 128 mg/dL — ABNORMAL HIGH (ref 70–99)

## 2019-06-01 LAB — URINALYSIS, ROUTINE W REFLEX MICROSCOPIC
Bilirubin Urine: NEGATIVE
Glucose, UA: NEGATIVE mg/dL
Hgb urine dipstick: NEGATIVE
Ketones, ur: NEGATIVE mg/dL
Leukocytes,Ua: NEGATIVE
Nitrite: NEGATIVE
Protein, ur: NEGATIVE mg/dL
Specific Gravity, Urine: 1.018 (ref 1.005–1.030)
pH: 8 (ref 5.0–8.0)

## 2019-06-01 LAB — CBC
HCT: 30.8 % — ABNORMAL LOW (ref 39.0–52.0)
Hemoglobin: 9.8 g/dL — ABNORMAL LOW (ref 13.0–17.0)
MCH: 29.3 pg (ref 26.0–34.0)
MCHC: 31.8 g/dL (ref 30.0–36.0)
MCV: 91.9 fL (ref 80.0–100.0)
Platelets: 573 10*3/uL — ABNORMAL HIGH (ref 150–400)
RBC: 3.35 MIL/uL — ABNORMAL LOW (ref 4.22–5.81)
RDW: 16.3 % — ABNORMAL HIGH (ref 11.5–15.5)
WBC: 14.4 10*3/uL — ABNORMAL HIGH (ref 4.0–10.5)
nRBC: 0 % (ref 0.0–0.2)

## 2019-06-01 LAB — PHOSPHORUS: Phosphorus: 2.6 mg/dL (ref 2.5–4.6)

## 2019-06-01 LAB — MAGNESIUM: Magnesium: 2.2 mg/dL (ref 1.7–2.4)

## 2019-06-01 MED ORDER — QUETIAPINE FUMARATE 50 MG PO TABS
50.0000 mg | ORAL_TABLET | Freq: Two times a day (BID) | ORAL | Status: DC
  Administered 2019-06-01 – 2019-06-02 (×3): 50 mg
  Filled 2019-06-01 (×3): qty 1

## 2019-06-01 MED ORDER — MORPHINE SULFATE (PF) 2 MG/ML IV SOLN: 1.0000 mg | Freq: Four times a day (QID) | INTRAVENOUS | Status: DC | PRN

## 2019-06-01 NOTE — Progress Notes (Signed)
Inpatient Rehab Admissions:  Inpatient Rehab Consult received.  I met with patient and significant other at the bedside for rehabilitation assessment and to discuss goals and expectations of an inpatient rehab admission.  [name] (SO) interested in CIR program and will ensure pt has 24/7 assist at discharge.  Spoke with Brigid Re (PA) and Dr. Bobbye Morton who report pt's trach can be changed today.  Will likely be ready to transition to CIR tomorrow. I will open insurance for prior authorization and plan for possible admission tomorrow pending approval and bed availability.   Signed: Shann Medal, PT, DPT Admissions Coordinator [phone] 06/01/19  12:58 PM

## 2019-06-01 NOTE — Procedures (Signed)
Tracheostomy Change Note  Patient Details:   Name: Luke Choi DOB: 1971/03/07 MRN: [id number]    Airway Documentation:     Evaluation  O2 sats: stable throughout Complications: No apparent complications Patient did tolerate procedure well. Bilateral Breath Sounds: Clear, Diminished  Pt sutures removed per physician order. Trach changed to #6 cuffless Shiley. ETCO2 with positive color change. Pt tolerated well and VSS throughout. RT will continue to monitor.   Sharla Kidney 06/01/2019, 2:55 PM
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
## 2019-06-02 LAB — CBC
HCT: 31.7 % — ABNORMAL LOW (ref 39.0–52.0)
Hemoglobin: 9.7 g/dL — ABNORMAL LOW (ref 13.0–17.0)
MCH: 28.5 pg (ref 26.0–34.0)
MCHC: 30.6 g/dL (ref 30.0–36.0)
MCV: 93.2 fL (ref 80.0–100.0)
Platelets: 577 10*3/uL — ABNORMAL HIGH (ref 150–400)
RBC: 3.4 MIL/uL — ABNORMAL LOW (ref 4.22–5.81)
RDW: 16.4 % — ABNORMAL HIGH (ref 11.5–15.5)
WBC: 13.5 10*3/uL — ABNORMAL HIGH (ref 4.0–10.5)
nRBC: 0 % (ref 0.0–0.2)

## 2019-06-02 LAB — BASIC METABOLIC PANEL
Anion gap: 11 (ref 5–15)
BUN: 19 mg/dL (ref 6–20)
CO2: 23 mmol/L (ref 22–32)
Calcium: 9.1 mg/dL (ref 8.9–10.3)
Chloride: 104 mmol/L (ref 98–111)
Creatinine, Ser: <0.3 mg/dL — ABNORMAL LOW (ref 0.61–1.24)
Glucose, Bld: 106 mg/dL — ABNORMAL HIGH (ref 70–99)
Potassium: 4.2 mmol/L (ref 3.5–5.1)
Sodium: 138 mmol/L (ref 135–145)

## 2019-06-02 LAB — GLUCOSE, CAPILLARY
Glucose-Capillary: 106 mg/dL — ABNORMAL HIGH (ref 70–99)
Glucose-Capillary: 112 mg/dL — ABNORMAL HIGH (ref 70–99)
Glucose-Capillary: 113 mg/dL — ABNORMAL HIGH (ref 70–99)
Glucose-Capillary: 119 mg/dL — ABNORMAL HIGH (ref 70–99)
Glucose-Capillary: 123 mg/dL — ABNORMAL HIGH (ref 70–99)
Glucose-Capillary: 126 mg/dL — ABNORMAL HIGH (ref 70–99)
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
## 2019-06-02 NOTE — Progress Notes (Signed)
SLP Cancellation Note  Patient Details Name: Luke Choi MRN: [id number] DOB: September 02, 1970   Cancelled treatment:       Reason Eval/Treat Not Completed: Fatigue/lethargy limiting ability to participate   PMSV treatment provided, however pt with increased lethargy/fatigue and was not appropriate for PO trials. BSE will be deferred to next available time when pt is able to sustained improved arousal/alertness.    Adell Koval 06/02/2019, 11:16 AM
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
## 2019-06-02 NOTE — Progress Notes (Signed)
Inpatient Rehab Admissions Coordinator:    Spoke with mother over the phone.  Awaiting insurance auth and hopefully will hear sometime tomorrow.   Shann Medal, PT, DPT Admissions Coordinator [phone] 06/02/19  3:14 PM

## 2019-06-03 ENCOUNTER — Inpatient Hospital Stay (HOSPITAL_COMMUNITY): Payer: BC Managed Care – PPO

## 2019-06-03 DIAGNOSIS — J9311 Primary spontaneous pneumothorax: Secondary | ICD-10-CM

## 2019-06-03 LAB — GLUCOSE, CAPILLARY
Glucose-Capillary: 110 mg/dL — ABNORMAL HIGH (ref 70–99)
Glucose-Capillary: 111 mg/dL — ABNORMAL HIGH (ref 70–99)
Glucose-Capillary: 114 mg/dL — ABNORMAL HIGH (ref 70–99)
Glucose-Capillary: 115 mg/dL — ABNORMAL HIGH (ref 70–99)
Glucose-Capillary: 116 mg/dL — ABNORMAL HIGH (ref 70–99)
Glucose-Capillary: 127 mg/dL — ABNORMAL HIGH (ref 70–99)

## 2019-06-03 LAB — URINALYSIS, ROUTINE W REFLEX MICROSCOPIC
Bilirubin Urine: NEGATIVE
Glucose, UA: NEGATIVE mg/dL
Hgb urine dipstick: NEGATIVE
Ketones, ur: NEGATIVE mg/dL
Leukocytes,Ua: NEGATIVE
Nitrite: NEGATIVE
Protein, ur: NEGATIVE mg/dL
Specific Gravity, Urine: 1.025 (ref 1.005–1.030)
pH: 6 (ref 5.0–8.0)

## 2019-06-03 LAB — CBC
HCT: 32.7 % — ABNORMAL LOW (ref 39.0–52.0)
Hemoglobin: 10.2 g/dL — ABNORMAL LOW (ref 13.0–17.0)
MCH: 28.8 pg (ref 26.0–34.0)
MCHC: 31.2 g/dL (ref 30.0–36.0)
MCV: 92.4 fL (ref 80.0–100.0)
Platelets: 619 10*3/uL — ABNORMAL HIGH (ref 150–400)
RBC: 3.54 MIL/uL — ABNORMAL LOW (ref 4.22–5.81)
RDW: 16.7 % — ABNORMAL HIGH (ref 11.5–15.5)
WBC: 17.5 10*3/uL — ABNORMAL HIGH (ref 4.0–10.5)
nRBC: 0 % (ref 0.0–0.2)

## 2019-06-03 MED ORDER — METHOCARBAMOL 500 MG PO TABS
500.0000 mg | ORAL_TABLET | Freq: Three times a day (TID) | ORAL | Status: DC | PRN
  Administered 2019-06-03 – 2019-06-07 (×7): 500 mg via ORAL
  Filled 2019-06-03 (×9): qty 1

## 2019-06-03 MED ORDER — LIDOCAINE HCL (PF) 1 % IJ SOLN
INTRAMUSCULAR | Status: AC
  Administered 2019-06-03: 30 mL via INTRAVENOUS
  Filled 2019-06-03: qty 30

## 2019-06-03 MED ORDER — QUETIAPINE FUMARATE 50 MG PO TABS
25.0000 mg | ORAL_TABLET | Freq: Two times a day (BID) | ORAL | Status: DC
  Administered 2019-06-03 – 2019-06-06 (×8): 25 mg
  Filled 2019-06-03 (×8): qty 1

## 2019-06-03 MED ORDER — GLYCOPYRROLATE 0.2 MG/ML IJ SOLN
0.1000 mg | Freq: Three times a day (TID) | INTRAMUSCULAR | Status: DC
  Administered 2019-06-03 – 2019-06-07 (×15): 0.1 mg via INTRAVENOUS
  Filled 2019-06-03 (×16): qty 1

## 2019-06-03 MED ORDER — FENTANYL CITRATE (PF) 100 MCG/2ML IJ SOLN
100.0000 ug | Freq: Once | INTRAMUSCULAR | Status: AC
  Administered 2019-06-03: 16:00:00 100 ug via INTRAVENOUS
  Filled 2019-06-03: qty 2

## 2019-06-03 NOTE — Progress Notes (Signed)
Inpatient Rehab Admissions Coordinator:    Per Brigid Re, PA, pt not medically ready for admission today.  Will f/u on Monday.   Shann Medal, PT, DPT Admissions Coordinator [phone] 06/03/19  10:30 AM
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
## 2019-06-03 NOTE — Procedures (Signed)
   Procedure Note  Date: 06/03/2019  Procedure: tube thoracostomy--right    Pre-op diagnosis: right pneumothorax, recurrent Post-op diagnosis: same  Surgeon: Jesusita Oka, MD  Anesthesia: local   EBL: <5cc procedural Drains/Implants: 27F chest tube Specimen: none  Description of procedure: Time-out was performed verifying correct patient, procedure, site, laterality, and signature of informed consent. Thirty cc's of local anesthetic was infiltrated into the tissues just over the fourth intercostal space.  A longitudinal incision was made parallel to the rib at the fourth intercostal space. This incision was deepened down through the muscle until the pleural cavity was entered. An audible air rush was encountered upon entry and a 27F chest tube was inserted through this tract. The tube was secured at 12cm at the skin with suture and connected to an atrium at -20cm water wall suction. Immediate output from the chest tube was 0cc. The site was dressed with xeroform, gauze, and tape. The patient tolerated the procedure well. There were no complications. Follow up chest x-ray was ordered to confirm tube positioning, complete evacuation, and complete lung re-expansion.    Jesusita Oka, MD General and [facility]
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
## 2019-06-04 ENCOUNTER — Inpatient Hospital Stay (HOSPITAL_COMMUNITY): Payer: BC Managed Care – PPO

## 2019-06-04 LAB — BASIC METABOLIC PANEL
Anion gap: 11 (ref 5–15)
BUN: 23 mg/dL — ABNORMAL HIGH (ref 6–20)
CO2: 23 mmol/L (ref 22–32)
Calcium: 9.3 mg/dL (ref 8.9–10.3)
Chloride: 104 mmol/L (ref 98–111)
Creatinine, Ser: 0.41 mg/dL — ABNORMAL LOW (ref 0.61–1.24)
GFR calc Af Amer: >60 mL/min (ref >60)
GFR calc non Af Amer: >60 mL/min (ref >60)
Glucose, Bld: 155 mg/dL — ABNORMAL HIGH (ref 70–99)
Potassium: 4 mmol/L (ref 3.5–5.1)
Sodium: 138 mmol/L (ref 135–145)

## 2019-06-04 LAB — GLUCOSE, CAPILLARY
Glucose-Capillary: 102 mg/dL — ABNORMAL HIGH (ref 70–99)
Glucose-Capillary: 103 mg/dL — ABNORMAL HIGH (ref 70–99)
Glucose-Capillary: 111 mg/dL — ABNORMAL HIGH (ref 70–99)
Glucose-Capillary: 119 mg/dL — ABNORMAL HIGH (ref 70–99)
Glucose-Capillary: 119 mg/dL — ABNORMAL HIGH (ref 70–99)
Glucose-Capillary: 119 mg/dL — ABNORMAL HIGH (ref 70–99)
Glucose-Capillary: 121 mg/dL — ABNORMAL HIGH (ref 70–99)

## 2019-06-04 LAB — CBC
HCT: 31.5 % — ABNORMAL LOW (ref 39.0–52.0)
Hemoglobin: 9.9 g/dL — ABNORMAL LOW (ref 13.0–17.0)
MCH: 28.8 pg (ref 26.0–34.0)
MCHC: 31.4 g/dL (ref 30.0–36.0)
MCV: 91.6 fL (ref 80.0–100.0)
Platelets: 592 10*3/uL — ABNORMAL HIGH (ref 150–400)
RBC: 3.44 MIL/uL — ABNORMAL LOW (ref 4.22–5.81)
RDW: 16.3 % — ABNORMAL HIGH (ref 11.5–15.5)
WBC: 15.2 10*3/uL — ABNORMAL HIGH (ref 4.0–10.5)
nRBC: 0 % (ref 0.0–0.2)

## 2019-06-04 LAB — URINE CULTURE: Culture: NO GROWTH

## 2019-06-04 MED ORDER — LIDOCAINE 5 % EX PTCH
1.0000 | MEDICATED_PATCH | CUTANEOUS | Status: DC
  Administered 2019-06-04 – 2019-06-06 (×3): 1 via TRANSDERMAL
  Filled 2019-06-04 (×6): qty 1
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
## 2019-06-05 ENCOUNTER — Inpatient Hospital Stay (HOSPITAL_COMMUNITY): Payer: BC Managed Care – PPO

## 2019-06-05 LAB — GLUCOSE, CAPILLARY
Glucose-Capillary: 111 mg/dL — ABNORMAL HIGH (ref 70–99)
Glucose-Capillary: 112 mg/dL — ABNORMAL HIGH (ref 70–99)
Glucose-Capillary: 115 mg/dL — ABNORMAL HIGH (ref 70–99)
Glucose-Capillary: 120 mg/dL — ABNORMAL HIGH (ref 70–99)
Glucose-Capillary: 120 mg/dL — ABNORMAL HIGH (ref 70–99)
Glucose-Capillary: 128 mg/dL — ABNORMAL HIGH (ref 70–99)

## 2019-06-05 LAB — CBC
HCT: 32.2 % — ABNORMAL LOW (ref 39.0–52.0)
Hemoglobin: 10.2 g/dL — ABNORMAL LOW (ref 13.0–17.0)
MCH: 29.1 pg (ref 26.0–34.0)
MCHC: 31.7 g/dL (ref 30.0–36.0)
MCV: 92 fL (ref 80.0–100.0)
Platelets: 575 10*3/uL — ABNORMAL HIGH (ref 150–400)
RBC: 3.5 MIL/uL — ABNORMAL LOW (ref 4.22–5.81)
RDW: 16.4 % — ABNORMAL HIGH (ref 11.5–15.5)
WBC: 18.9 10*3/uL — ABNORMAL HIGH (ref 4.0–10.5)
nRBC: 0 % (ref 0.0–0.2)

## 2019-06-05 MED ORDER — LORAZEPAM 2 MG/ML IJ SOLN
INTRAMUSCULAR | Status: AC
  Administered 2019-06-05: 2 mg
  Filled 2019-06-05: qty 1

## 2019-06-05 MED ORDER — LORAZEPAM 2 MG/ML IJ SOLN
2.0000 mg | INTRAMUSCULAR | Status: DC | PRN
  Administered 2019-06-06 – 2019-06-08 (×4): 2 mg via INTRAVENOUS
  Filled 2019-06-05 (×4): qty 1

## 2019-06-05 MED ORDER — CHLORHEXIDINE GLUCONATE 0.12 % MT SOLN
15.0000 mL | Freq: Two times a day (BID) | OROMUCOSAL | Status: DC
  Administered 2019-06-05 – 2019-06-08 (×6): 15 mL via OROMUCOSAL
  Filled 2019-06-05 (×6): qty 15

## 2019-06-05 MED ORDER — ORAL CARE MOUTH RINSE
15.0000 mL | Freq: Two times a day (BID) | OROMUCOSAL | Status: DC
  Administered 2019-06-05 – 2019-06-08 (×6): 15 mL via OROMUCOSAL

## 2019-06-05 MED ORDER — HALOPERIDOL LACTATE 5 MG/ML IJ SOLN: 5.0000 mg | Freq: Four times a day (QID) | INTRAMUSCULAR | Status: DC | PRN

## 2019-06-05 NOTE — Progress Notes (Signed)
Chest tube to water seal per order.
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
## 2019-06-05 NOTE — Progress Notes (Signed)
Overnight, pt has had increased secretions and needed suctioning more often than previous shift. Trach care also provided multiple times. Secretion thick and yellow-green tinged. Pt has been afebrile. Will continue to monitor.

## 2019-06-06 ENCOUNTER — Inpatient Hospital Stay (HOSPITAL_COMMUNITY): Payer: BC Managed Care – PPO

## 2019-06-06 LAB — GLUCOSE, CAPILLARY
Glucose-Capillary: 105 mg/dL — ABNORMAL HIGH (ref 70–99)
Glucose-Capillary: 112 mg/dL — ABNORMAL HIGH (ref 70–99)
Glucose-Capillary: 117 mg/dL — ABNORMAL HIGH (ref 70–99)
Glucose-Capillary: 128 mg/dL — ABNORMAL HIGH (ref 70–99)
Glucose-Capillary: 96 mg/dL (ref 70–99)
Glucose-Capillary: 99 mg/dL (ref 70–99)

## 2019-06-06 MED ORDER — PRO-STAT SUGAR FREE PO LIQD
60.0000 mL | Freq: Two times a day (BID) | ORAL | Status: DC
  Administered 2019-06-06 – 2019-06-08 (×5): 60 mL
  Filled 2019-06-06 (×5): qty 60

## 2019-06-06 MED ORDER — OSMOLITE 1.5 CAL PO LIQD
1000.0000 mL | ORAL | Status: DC
  Administered 2019-06-06 – 2019-06-07 (×3): 1000 mL
  Filled 2019-06-06 (×4): qty 1000
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
## 2019-06-06 NOTE — Progress Notes (Addendum)
5 weeks post placement of Acell Subjective: Luke Choi is resting in bed. Fiance at bedside.  Moving LLE. Mildly sedated Nonverbal.   Objective: Vital signs in last 24 hours: Temp:  [97.6 F (36.4 C)-98.9 F (37.2 C)] 98.9 F (37.2 C) (10/26 0857) Pulse Rate:  [86-118] 93 (10/26 0857) Resp:  [14-27] 17 (10/26 0857) BP: (107-134)/(68-81) 118/79 (10/26 0857) SpO2:  [91 %-100 %] 100 % (10/26 0857) FiO2 (%):  [28 %-35 %] 28 % (10/26 0857) Last BM Date: 06/04/19  Intake/Output from previous day: 10/25 0701 - 10/26 0700 In: 1076.4 [I.V.:10; NG/GT:1066.4] Out: 1000 [Urine:550; Emesis/NG output:450] Intake/Output this shift: Total I/O In: 0  Out: 350 [Urine:350]  General appearance: alert, no distress, fiance at bedside, trach tube in place Chest: Chest tube in place R lateral chest. Extremities: Boot on RLE, LLE wound is 3.25 cm x 1 cm and flush with surrounding epithelial tissue. Superior to wound is incisional wound with mild exudate, petroleum gauze in place. No erythema, drainage, swelling noted. Pulses: 2+ and symmetric Skin: Warm, dry  Lab Results:  CBC    Component Value Date/Time   WBC 18.9 (H) 06/05/2019 0657   RBC 3.50 (L) 06/05/2019 0657   HGB 10.2 (L) 06/05/2019 0657   HCT 32.2 (L) 06/05/2019 0657   PLT 575 (H) 06/05/2019 0657   MCV 92.0 06/05/2019 0657   MCH 29.1 06/05/2019 0657   MCHC 31.7 06/05/2019 0657   RDW 16.4 (H) 06/05/2019 0657   LYMPHSABS 1.0 05/30/2019 0647   MONOABS 1.3 (H) 05/30/2019 0647   EOSABS 0.4 05/30/2019 0647   BASOSABS 0.1 05/30/2019 0647    BMET Recent Labs    06/04/19 0508  NA 138  K 4.0  CL 104  CO2 23  GLUCOSE 155*  BUN 23*  CREATININE 0.41*  CALCIUM 9.3   PT/INR No results for input(s): LABPROT, INR in the last 72 hours. ABG No results for input(s): PHART, HCO3 in the last 72 hours.  Invalid input(s): PCO2, PO2  Studies/Results: Dg Chest Port 1 View  Result Date: 06/06/2019 CLINICAL DATA:  Chest tube  assessment. EXAM: PORTABLE CHEST 1 VIEW COMPARISON:  06/05/2019 and 06/03/2019 FINDINGS: Right-sided chest tube unchanged. Tracheostomy tube unchanged. Enteric tube courses into the stomach and off the film as tip is not visualized. Lungs are adequately inflated with persistent opacification over the lung apices unchanged mild stable interstitial changes over the lung bases. No new airspace process or effusion. No definite right apical pneumothorax visualized. Cardiomediastinal silhouette and remainder of the exam is unchanged. IMPRESSION: Stable biapical/upper lobe opacification and stable interstitial changes over the lung bases. Tubes and lines as described. No right-sided pneumothorax visualized. Electronically Signed   By: Elberta Fortisaniel  Boyle M.D.   On: 06/06/2019 07:23   Dg Chest Port 1 View  Result Date: 06/05/2019 CLINICAL DATA:  Pneumothorax on the right EXAM: PORTABLE CHEST 1 VIEW COMPARISON:  Chest radiograph dated 06/04/2019 FINDINGS: A tracheostomy tube terminates in the midthoracic trachea. An enteric tube enters the stomach and terminates below the field of view. A right-sided thoracostomy tube is unchanged in position with tip overlying the right mid lung laterally. The cardiac silhouette is unchanged. Mild bilateral upper lung airspace opacities are unchanged. Mild bibasilar interstitial and airspace opacities are also unchanged. There is no significant pleural effusion or pneumothorax. A scapular fracture is redemonstrated. Known right-sided rib fractures are better seen on prior CT. IMPRESSION: 1. No significant interval change in the appearance of the chest. No significant pneumothorax with a  right-sided thoracostomy tube in unchanged position. Electronically Signed   By: Zerita Boers M.D.   On: 06/05/2019 15:33    Anti-infectives: Anti-infectives (From admission, onward)   Start     Dose/Rate Route Frequency Ordered Stop   05/26/19 1600  ceFEPIme (MAXIPIME) 2 g in sodium chloride 0.9 % 100  mL IVPB     2 g 200 mL/hr over 30 Minutes Intravenous Every 8 hours 05/26/19 1239 05/30/19 1900   05/25/19 1600  ceFEPIme (MAXIPIME) 1 g in sodium chloride 0.9 % 100 mL IVPB  Status:  Discontinued     1 g 200 mL/hr over 30 Minutes Intravenous Every 8 hours 05/25/19 1531 05/26/19 1239   05/15/19 1200  sulfamethoxazole-trimethoprim (BACTRIM) 400 mg of trimethoprim in dextrose 5 % 500 mL IVPB     400 mg of trimethoprim 350 mL/hr over 90 Minutes Intravenous Every 8 hours 05/15/19 1131 05/22/19 0611   05/05/19 1400  cefTRIAXone (ROCEPHIN) 2 g in sodium chloride 0.9 % 100 mL IVPB     2 g 200 mL/hr over 30 Minutes Intravenous Every 24 hours 05/05/19 0814 05/11/19 1357   05/04/19 0600  ceFAZolin (ANCEF) IVPB 2g/100 mL premix     2 g 200 mL/hr over 30 Minutes Intravenous On call to O.R. 05/03/19 1202 05/05/19 0559   05/01/19 1630  vancomycin (VANCOCIN) 2,000 mg in sodium chloride 0.9 % 500 mL IVPB  Status:  Discontinued     2,000 mg 250 mL/hr over 120 Minutes Intravenous Every 12 hours 05/01/19 1027 05/05/19 0814   04/29/19 0430  vancomycin (VANCOCIN) 1,250 mg in sodium chloride 0.9 % 250 mL IVPB  Status:  Discontinued     1,250 mg 166.7 mL/hr over 90 Minutes Intravenous Every 12 hours 04/28/19 1600 05/01/19 1027   04/28/19 1630  vancomycin (VANCOCIN) 1,250 mg in sodium chloride 0.9 % 250 mL IVPB  Status:  Discontinued     1,250 mg 166.7 mL/hr over 90 Minutes Intravenous Every 12 hours 04/28/19 1550 04/28/19 1600   04/28/19 1630  vancomycin (VANCOCIN) 2,250 mg in sodium chloride 0.9 % 500 mL IVPB     2,250 mg 250 mL/hr over 120 Minutes Intravenous  Once 04/28/19 1600 04/28/19 1916   04/26/19 1600  ceFEPIme (MAXIPIME) 2 g in sodium chloride 0.9 % 100 mL IVPB  Status:  Discontinued     2 g 200 mL/hr over 30 Minutes Intravenous Every 8 hours 04/26/19 1556 05/05/19 0814   04/22/19 0945  ceFAZolin (ANCEF) IVPB 2g/100 mL premix    Note to Pharmacy: Anesthesia to give preop   2 g 200 mL/hr over 30  Minutes Intravenous  Once 04/22/19 0930 04/22/19 1245   04/22/19 0945  ceFAZolin (ANCEF) IVPB 2g/100 mL premix  Status:  Discontinued     2 g 200 mL/hr over 30 Minutes Intravenous Every 8 hours 04/22/19 0930 04/22/19 1330   04/20/19 2130  penicillin G potassium 2 Million Units in dextrose 5 % 50 mL IVPB     2 Million Units 100 mL/hr over 30 Minutes Intravenous STAT 04/20/19 2104 04/20/19 2216   04/20/19 2130  gentamicin (GARAMYCIN) 400 mg in dextrose 5 % 50 mL IVPB     5 mg/kg  79.8 kg 120 mL/hr over 30 Minutes Intravenous STAT 04/20/19 2115 04/20/19 2228   04/20/19 1900  ceFAZolin (ANCEF) IVPB 2g/100 mL premix     2 g 200 mL/hr over 30 Minutes Intravenous  Once 04/20/19 1851 04/20/19 2036      Assessment/Plan: s/p Procedure(s): ~5  weeks from placement of Acell to LLE wound  Acell incorporated, wound healing well.   Continue with daily dressing changes. Change regimen to vaseline, gauze, kerlix on LLE wound   Pictures were obtained of the patient and placed in the chart with the patient's or guardian's permission.   LOS: 47 days    Leslee Home, PA-C 06/06/2019
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
## 2019-06-06 NOTE — Progress Notes (Signed)
R chest tube removed per MD order. Pt tolerated well. Catheter intact. Site covered with occlusive dressing.
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
## 2019-06-07 ENCOUNTER — Inpatient Hospital Stay (HOSPITAL_COMMUNITY): Payer: BC Managed Care – PPO

## 2019-06-07 LAB — GLUCOSE, CAPILLARY
Glucose-Capillary: 108 mg/dL — ABNORMAL HIGH (ref 70–99)
Glucose-Capillary: 112 mg/dL — ABNORMAL HIGH (ref 70–99)
Glucose-Capillary: 113 mg/dL — ABNORMAL HIGH (ref 70–99)
Glucose-Capillary: 117 mg/dL — ABNORMAL HIGH (ref 70–99)
Glucose-Capillary: 95 mg/dL (ref 70–99)

## 2019-06-07 LAB — CBC
HCT: 35.4 % — ABNORMAL LOW (ref 39.0–52.0)
Hemoglobin: 10.9 g/dL — ABNORMAL LOW (ref 13.0–17.0)
MCH: 28.8 pg (ref 26.0–34.0)
MCHC: 30.8 g/dL (ref 30.0–36.0)
MCV: 93.4 fL (ref 80.0–100.0)
Platelets: 575 10*3/uL — ABNORMAL HIGH (ref 150–400)
RBC: 3.79 MIL/uL — ABNORMAL LOW (ref 4.22–5.81)
RDW: 15.9 % — ABNORMAL HIGH (ref 11.5–15.5)
WBC: 12 10*3/uL — ABNORMAL HIGH (ref 4.0–10.5)
nRBC: 0 % (ref 0.0–0.2)

## 2019-06-07 MED ORDER — CLONAZEPAM 0.1 MG/ML ORAL SUSPENSION: 0.2500 mg | Freq: Two times a day (BID) | ORAL | Status: DC

## 2019-06-07 MED ORDER — CLONAZEPAM 0.25 MG PO TBDP
0.2500 mg | ORAL_TABLET | Freq: Two times a day (BID) | ORAL | Status: DC
  Administered 2019-06-07 – 2019-06-08 (×2): 0.25 mg via ORAL
  Filled 2019-06-07 (×2): qty 1

## 2019-06-07 MED ORDER — CLONAZEPAM 0.5 MG PO TABS
0.2500 mg | ORAL_TABLET | Freq: Two times a day (BID) | ORAL | Status: DC
  Administered 2019-06-07: 0.25 mg
  Filled 2019-06-07: qty 1

## 2019-06-07 MED ORDER — QUETIAPINE FUMARATE 50 MG PO TABS
25.0000 mg | ORAL_TABLET | Freq: Two times a day (BID) | ORAL | Status: DC
  Administered 2019-06-07 – 2019-06-08 (×3): 25 mg
  Filled 2019-06-07 (×3): qty 1

## 2019-06-07 MED ORDER — QUETIAPINE FUMARATE 50 MG PO TABS: 50.0000 mg | ORAL_TABLET | Freq: Two times a day (BID) | ORAL | Status: DC

## 2019-06-07 MED ORDER — LIDOCAINE 5 % EX PTCH
1.0000 | MEDICATED_PATCH | Freq: Every day | CUTANEOUS | Status: DC | PRN
  Filled 2019-06-07: qty 1

## 2019-06-07 NOTE — Procedures (Signed)
Tracheostomy Change Note  Patient Details:   Name: Luke Choi DOB: [birth year] MRN: [id number]    Airway Documentation:    trach changed to #4 shiley cuffless per order. + easy cap change (purple to yellow), no distress noted. VSS  Evaluation  O2 sats: stable throughout Complications: No apparent complications Patient did tolerate procedure well. Bilateral Breath Sounds: Other (Comment)(coarse)    Lenna Sciara 06/07/2019, 12:50 PM
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
## 2019-06-08 ENCOUNTER — Other Ambulatory Visit: Payer: Self-pay

## 2019-06-08 ENCOUNTER — Encounter (HOSPITAL_COMMUNITY): Payer: Self-pay

## 2019-06-08 ENCOUNTER — Inpatient Hospital Stay (HOSPITAL_COMMUNITY)
Admission: RE | Admit: 2019-06-08 | Discharge: 2019-06-10 | DRG: 949 | Disposition: A | Discharge status: Other Acute Inpatient Hospital | Payer: BC Managed Care – PPO | Source: Intra-hospital | Attending: Physical Medicine & Rehabilitation | Admitting: Physical Medicine & Rehabilitation

## 2019-06-08 ENCOUNTER — Inpatient Hospital Stay (HOSPITAL_COMMUNITY): Payer: BC Managed Care – PPO

## 2019-06-08 ENCOUNTER — Encounter: Payer: Self-pay | Admitting: Internal Medicine

## 2019-06-08 DIAGNOSIS — Z430 Encounter for attention to tracheostomy: Secondary | ICD-10-CM

## 2019-06-08 DIAGNOSIS — S42101D Fracture of unspecified part of scapula, right shoulder, subsequent encounter for fracture with routine healing: Secondary | ICD-10-CM | POA: Diagnosis not present

## 2019-06-08 DIAGNOSIS — I82411 Acute embolism and thrombosis of right femoral vein: Secondary | ICD-10-CM | POA: Diagnosis not present

## 2019-06-08 DIAGNOSIS — F1010 Alcohol abuse, uncomplicated: Secondary | ICD-10-CM | POA: Diagnosis present

## 2019-06-08 DIAGNOSIS — S8251XS Displaced fracture of medial malleolus of right tibia, sequela: Secondary | ICD-10-CM | POA: Diagnosis not present

## 2019-06-08 DIAGNOSIS — S069XAA Unspecified intracranial injury with loss of consciousness status unknown, initial encounter: Secondary | ICD-10-CM | POA: Diagnosis present

## 2019-06-08 DIAGNOSIS — Z7141 Alcohol abuse counseling and surveillance of alcoholic: Secondary | ICD-10-CM | POA: Diagnosis not present

## 2019-06-08 DIAGNOSIS — I82451 Acute embolism and thrombosis of right peroneal vein: Secondary | ICD-10-CM | POA: Diagnosis present

## 2019-06-08 DIAGNOSIS — S2241XD Multiple fractures of ribs, right side, subsequent encounter for fracture with routine healing: Secondary | ICD-10-CM

## 2019-06-08 DIAGNOSIS — S8251XD Displaced fracture of medial malleolus of right tibia, subsequent encounter for closed fracture with routine healing: Secondary | ICD-10-CM

## 2019-06-08 DIAGNOSIS — S06369D Traumatic hemorrhage of cerebrum, unspecified, with loss of consciousness of unspecified duration, subsequent encounter: Secondary | ICD-10-CM | POA: Diagnosis not present

## 2019-06-08 DIAGNOSIS — R1310 Dysphagia, unspecified: Secondary | ICD-10-CM

## 2019-06-08 DIAGNOSIS — R1312 Dysphagia, oropharyngeal phase: Secondary | ICD-10-CM | POA: Diagnosis not present

## 2019-06-08 DIAGNOSIS — Z978 Presence of other specified devices: Secondary | ICD-10-CM | POA: Diagnosis not present

## 2019-06-08 DIAGNOSIS — T07XXXA Unspecified multiple injuries, initial encounter: Secondary | ICD-10-CM | POA: Diagnosis not present

## 2019-06-08 DIAGNOSIS — S069X3S Unspecified intracranial injury with loss of consciousness of 1 hour to 5 hours 59 minutes, sequela: Secondary | ICD-10-CM | POA: Diagnosis not present

## 2019-06-08 DIAGNOSIS — J9601 Acute respiratory failure with hypoxia: Secondary | ICD-10-CM | POA: Diagnosis not present

## 2019-06-08 DIAGNOSIS — J939 Pneumothorax, unspecified: Secondary | ICD-10-CM | POA: Diagnosis not present

## 2019-06-08 DIAGNOSIS — I82431 Acute embolism and thrombosis of right popliteal vein: Secondary | ICD-10-CM | POA: Diagnosis present

## 2019-06-08 DIAGNOSIS — S72111D Displaced fracture of greater trochanter of right femur, subsequent encounter for closed fracture with routine healing: Secondary | ICD-10-CM

## 2019-06-08 DIAGNOSIS — R0602 Shortness of breath: Secondary | ICD-10-CM

## 2019-06-08 DIAGNOSIS — R000 Tachycardia, unspecified: Secondary | ICD-10-CM | POA: Diagnosis not present

## 2019-06-08 DIAGNOSIS — Z930 Tracheostomy status: Secondary | ICD-10-CM

## 2019-06-08 DIAGNOSIS — I119 Hypertensive heart disease without heart failure: Secondary | ICD-10-CM | POA: Diagnosis not present

## 2019-06-08 DIAGNOSIS — S062X9D Diffuse traumatic brain injury with loss of consciousness of unspecified duration, subsequent encounter: Principal | ICD-10-CM

## 2019-06-08 DIAGNOSIS — D62 Acute posthemorrhagic anemia: Secondary | ICD-10-CM | POA: Diagnosis not present

## 2019-06-08 DIAGNOSIS — S72114S Nondisplaced fracture of greater trochanter of right femur, sequela: Secondary | ICD-10-CM | POA: Diagnosis not present

## 2019-06-08 DIAGNOSIS — I10 Essential (primary) hypertension: Secondary | ICD-10-CM

## 2019-06-08 DIAGNOSIS — I82441 Acute embolism and thrombosis of right tibial vein: Secondary | ICD-10-CM | POA: Diagnosis not present

## 2019-06-08 DIAGNOSIS — R0902 Hypoxemia: Secondary | ICD-10-CM

## 2019-06-08 DIAGNOSIS — S069X9A Unspecified intracranial injury with loss of consciousness of unspecified duration, initial encounter: Secondary | ICD-10-CM | POA: Diagnosis present

## 2019-06-08 DIAGNOSIS — R4689 Other symptoms and signs involving appearance and behavior: Secondary | ICD-10-CM | POA: Diagnosis not present

## 2019-06-08 LAB — GLUCOSE, CAPILLARY
Glucose-Capillary: 93 mg/dL (ref 70–99)
Glucose-Capillary: 112 mg/dL — ABNORMAL HIGH (ref 70–99)
Glucose-Capillary: 130 mg/dL — ABNORMAL HIGH (ref 70–99)
Glucose-Capillary: 108 mg/dL — ABNORMAL HIGH (ref 70–99)
Glucose-Capillary: 112 mg/dL — ABNORMAL HIGH (ref 70–99)
Glucose-Capillary: 128 mg/dL — ABNORMAL HIGH (ref 70–99)

## 2019-06-08 MED ORDER — FOLIC ACID 1 MG PO TABS
1.0000 mg | ORAL_TABLET | Freq: Every day | ORAL | Status: DC
  Administered 2019-06-09: 1 mg
  Filled 2019-06-08: qty 1

## 2019-06-08 MED ORDER — ADULT MULTIVITAMIN LIQUID CH
15.0000 mL | Freq: Every day | ORAL | Status: DC
  Administered 2019-06-09: 15 mL via ORAL
  Filled 2019-06-08 (×3): qty 15

## 2019-06-08 MED ORDER — HYPROMELLOSE (GONIOSCOPIC) 2.5 % OP SOLN: 1.0000 [drp] | OPHTHALMIC | Status: DC | PRN

## 2019-06-08 MED ORDER — OXYCODONE HCL 5 MG/5ML PO SOLN
5.0000 mg | ORAL | Status: DC | PRN
  Administered 2019-06-09 (×2): 5 mg
  Administered 2019-06-09: 10 mg
  Filled 2019-06-08 (×3): qty 10

## 2019-06-08 MED ORDER — IPRATROPIUM-ALBUTEROL 0.5-2.5 (3) MG/3ML IN SOLN: 3.0000 mL | RESPIRATORY_TRACT | Status: DC | PRN

## 2019-06-08 MED ORDER — ADULT MULTIVITAMIN W/MINERALS CH: 1.0000 | ORAL_TABLET | Freq: Every day | ORAL | Status: DC

## 2019-06-08 MED ORDER — INSULIN ASPART 100 UNIT/ML ~~LOC~~ SOLN
0.0000 [IU] | SUBCUTANEOUS | Status: DC
  Administered 2019-06-08: 2 [IU] via SUBCUTANEOUS

## 2019-06-08 MED ORDER — FREE WATER
100.0000 mL | Freq: Four times a day (QID) | Status: DC
  Administered 2019-06-08 – 2019-06-09 (×5): 100 mL

## 2019-06-08 MED ORDER — PRO-STAT SUGAR FREE PO LIQD
60.0000 mL | Freq: Two times a day (BID) | ORAL | Status: DC
  Administered 2019-06-08 – 2019-06-09 (×3): 60 mL
  Filled 2019-06-08 (×3): qty 60

## 2019-06-08 MED ORDER — LIDOCAINE 5 % EX PTCH: 1.0000 | MEDICATED_PATCH | Freq: Every day | CUTANEOUS | Status: DC | PRN

## 2019-06-08 MED ORDER — METOPROLOL TARTRATE 25 MG PO TABS
25.0000 mg | ORAL_TABLET | Freq: Once | ORAL | Status: AC
  Administered 2019-06-08: 25 mg
  Filled 2019-06-08: qty 1

## 2019-06-08 MED ORDER — GLYCOPYRROLATE 0.2 MG/ML IJ SOLN: 0.2000 mg | Freq: Three times a day (TID) | INTRAMUSCULAR | Status: DC

## 2019-06-08 MED ORDER — VITAMIN B-1 100 MG PO TABS
100.0000 mg | ORAL_TABLET | Freq: Every day | ORAL | Status: DC
  Administered 2019-06-09: 100 mg
  Filled 2019-06-08: qty 1

## 2019-06-08 MED ORDER — ACETAMINOPHEN 650 MG RE SUPP: 650.0000 mg | Freq: Four times a day (QID) | RECTAL | Status: DC | PRN

## 2019-06-08 MED ORDER — CHLORHEXIDINE GLUCONATE 0.12 % MT SOLN
15.0000 mL | Freq: Two times a day (BID) | OROMUCOSAL | Status: DC
  Filled 2019-06-08 (×3): qty 15

## 2019-06-08 MED ORDER — LORAZEPAM 2 MG/ML IJ SOLN
2.0000 mg | INTRAMUSCULAR | Status: DC | PRN
  Administered 2019-06-09: 2 mg via INTRAVENOUS
  Filled 2019-06-08 (×2): qty 1

## 2019-06-08 MED ORDER — CLONAZEPAM 0.25 MG PO TBDP
0.2500 mg | ORAL_TABLET | Freq: Two times a day (BID) | ORAL | Status: DC
  Administered 2019-06-08 – 2019-06-09 (×3): 0.25 mg
  Filled 2019-06-08 (×4): qty 1

## 2019-06-08 MED ORDER — METHOCARBAMOL 500 MG PO TABS
500.0000 mg | ORAL_TABLET | Freq: Three times a day (TID) | ORAL | Status: DC | PRN
  Administered 2019-06-09 – 2019-06-10 (×2): 500 mg via ORAL
  Filled 2019-06-08 (×2): qty 1

## 2019-06-08 MED ORDER — JUVEN PO PACK
1.0000 | PACK | Freq: Two times a day (BID) | ORAL | Status: DC
  Administered 2019-06-09 (×2): 1
  Filled 2019-06-08 (×2): qty 1

## 2019-06-08 MED ORDER — QUETIAPINE FUMARATE 50 MG PO TABS: 50.0000 mg | ORAL_TABLET | Freq: Two times a day (BID) | ORAL | Status: DC

## 2019-06-08 MED ORDER — ACETAMINOPHEN 160 MG/5ML PO SOLN: 650.0000 mg | ORAL | Status: DC | PRN

## 2019-06-08 MED ORDER — APIXABAN 5 MG PO TABS
5.0000 mg | ORAL_TABLET | Freq: Two times a day (BID) | ORAL | Status: DC
  Administered 2019-06-08 – 2019-06-09 (×3): 5 mg
  Filled 2019-06-08 (×3): qty 1

## 2019-06-08 MED ORDER — OSMOLITE 1.5 CAL PO LIQD
1000.0000 mL | ORAL | Status: DC
  Administered 2019-06-08 – 2019-06-09 (×2): 1000 mL
  Filled 2019-06-08 (×2): qty 1000

## 2019-06-08 MED ORDER — DOCUSATE SODIUM 50 MG/5ML PO LIQD
100.0000 mg | Freq: Every day | ORAL | Status: DC
  Administered 2019-06-09: 100 mg
  Filled 2019-06-08: qty 10

## 2019-06-08 MED ORDER — QUETIAPINE FUMARATE 50 MG PO TABS
50.0000 mg | ORAL_TABLET | Freq: Once | ORAL | Status: AC
  Administered 2019-06-08: 50 mg via ORAL
  Filled 2019-06-08: qty 1

## 2019-06-08 MED ORDER — METOPROLOL TARTRATE 25 MG/10 ML ORAL SUSPENSION
75.0000 mg | Freq: Three times a day (TID) | ORAL | Status: DC
  Administered 2019-06-08 – 2019-06-09 (×4): 75 mg
  Filled 2019-06-08 (×6): qty 30
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
## 2019-06-08 NOTE — Progress Notes (Addendum)
Inpatient Rehab Admissions Coordinator:   Awaiting update from insurance.  Denied yesterday, Dr. Bobbye Morton to complete peer to peer review to attempt to overturn denial.    Addendum: I have insurance authorization and a bed available for pt to admit to CIR today. Dr. Bobbye Morton cleared pt for d/c.  Will let pt/family, CM, and RN know.   Shann Medal, PT, DPT Admissions Coordinator [phone] 06/08/19  11:27 AM
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
## 2019-06-08 NOTE — Progress Notes (Signed)
Pts trach had lg amount of mucous clogging the opening of the trach. Pt continued to cough and take the take the trach collar off. Pt is wide awake, appears restless and uncomfortable, denies pain at this time. Pt O2 is 93%. Call light in reach, will continue to monitor.

## 2019-06-08 NOTE — Progress Notes (Signed)
Pt arrived to unit via hospital bed with girlfriend and belongings. Pt in no distress. Pt and family educated to fall policy, personal belongings policy, and visitor policy. Pt demonstrated correct use of call bell system, bed alarm on and in low position, will continue to monitor. Larina Earthly

## 2019-06-08 NOTE — TOC Transition Note (Signed)
Transition of Care [facility]) - CM/SW Discharge Note   Patient Details  Name: Luke Choi MRN: [id number] Date of Birth: 1971-05-04  [facility] CM/SW Contact:  [name], RN Phone Number: 06/08/2019, 3:56 PM   Clinical Narrative:  Insurance authorization received and bed available for admission to [facility] today.  Pt medically stable for discharge, per MD.       Final next level of care: IP Rehab Facility Barriers to Discharge: Barriers Resolved   Patient Goals and CMS Choice   CMS Medicare.gov Compare Post Acute Care list provided to:: Patient Represenative (must comment) Choice offered to / list presented to : Spouse  Discharge Placement                       Discharge Plan and Services   Discharge Planning Services: CM Consult Post Acute Care Choice: IP Rehab              [name], RN, BSN  Trauma/Neuro ICU Case Manager [phone]

## 2019-06-08 NOTE — Progress Notes (Signed)
SLP Cancellation Note  Patient Details Name: Luke Choi MRN: [id number] DOB: 08/15/1970   Cancelled treatment:        Pt placed in [place] chair. Heart rate in upper 130's, increased work of breathing although RR in normal range. With PMV donned pt verbalizing "no", appeared very anxious and eyes open but rolling back upward intermittently. Transferred back to bed and transport staff took pt back to room.   Will re-attempt tomorrow.    Houston Siren 06/08/2019, 10:54 AM   Orbie Pyo Colvin Caroli.Ed Risk analyst [phone] Office [phone]

## 2019-06-08 NOTE — Progress Notes (Signed)
Patient transferring to 4W10. Nurse and family with him. Report called to 4W RT.

## 2019-06-09 ENCOUNTER — Inpatient Hospital Stay (HOSPITAL_COMMUNITY): Payer: BC Managed Care – PPO

## 2019-06-09 ENCOUNTER — Inpatient Hospital Stay (HOSPITAL_COMMUNITY): Payer: BC Managed Care – PPO | Admitting: Speech Pathology

## 2019-06-09 ENCOUNTER — Inpatient Hospital Stay (HOSPITAL_COMMUNITY): Payer: Self-pay | Admitting: Physical Therapy

## 2019-06-09 DIAGNOSIS — R4689 Other symptoms and signs involving appearance and behavior: Secondary | ICD-10-CM

## 2019-06-09 DIAGNOSIS — Z930 Tracheostomy status: Secondary | ICD-10-CM

## 2019-06-09 DIAGNOSIS — S069X3S Unspecified intracranial injury with loss of consciousness of 1 hour to 5 hours 59 minutes, sequela: Secondary | ICD-10-CM

## 2019-06-09 DIAGNOSIS — S72114S Nondisplaced fracture of greater trochanter of right femur, sequela: Secondary | ICD-10-CM

## 2019-06-09 DIAGNOSIS — S069X0S Unspecified intracranial injury without loss of consciousness, sequela: Secondary | ICD-10-CM

## 2019-06-09 DIAGNOSIS — S8251XS Displaced fracture of medial malleolus of right tibia, sequela: Secondary | ICD-10-CM | POA: Diagnosis not present

## 2019-06-09 LAB — CBC WITH DIFFERENTIAL/PLATELET
Abs Immature Granulocytes: 0.13 10*3/uL — ABNORMAL HIGH (ref 0.00–0.07)
Basophils Absolute: 0.1 10*3/uL (ref 0.0–0.1)
Basophils Relative: 1 %
Eosinophils Absolute: 0.4 10*3/uL (ref 0.0–0.5)
Eosinophils Relative: 3 %
HCT: 39.7 % (ref 39.0–52.0)
Hemoglobin: 12.2 g/dL — ABNORMAL LOW (ref 13.0–17.0)
Immature Granulocytes: 1 %
Lymphocytes Relative: 9 %
Lymphs Abs: 1.5 10*3/uL (ref 0.7–4.0)
MCH: 28.2 pg (ref 26.0–34.0)
MCHC: 30.7 g/dL (ref 30.0–36.0)
MCV: 91.9 fL (ref 80.0–100.0)
Monocytes Absolute: 1.2 10*3/uL — ABNORMAL HIGH (ref 0.1–1.0)
Monocytes Relative: 7 %
Neutro Abs: 13.6 10*3/uL — ABNORMAL HIGH (ref 1.7–7.7)
Neutrophils Relative %: 79 %
Platelets: 588 10*3/uL — ABNORMAL HIGH (ref 150–400)
RBC: 4.32 MIL/uL (ref 4.22–5.81)
RDW: 15.6 % — ABNORMAL HIGH (ref 11.5–15.5)
WBC: 17 10*3/uL — ABNORMAL HIGH (ref 4.0–10.5)
nRBC: 0 % (ref 0.0–0.2)

## 2019-06-09 LAB — COMPREHENSIVE METABOLIC PANEL
ALT: 64 U/L — ABNORMAL HIGH (ref 0–44)
AST: 29 U/L (ref 15–41)
Albumin: 3.1 g/dL — ABNORMAL LOW (ref 3.5–5.0)
Alkaline Phosphatase: 168 U/L — ABNORMAL HIGH (ref 38–126)
Anion gap: 12 (ref 5–15)
BUN: 17 mg/dL (ref 6–20)
CO2: 24 mmol/L (ref 22–32)
Calcium: 9.5 mg/dL (ref 8.9–10.3)
Chloride: 101 mmol/L (ref 98–111)
Creatinine, Ser: 0.45 mg/dL — ABNORMAL LOW (ref 0.61–1.24)
GFR calc Af Amer: >60 mL/min (ref >60)
GFR calc non Af Amer: >60 mL/min (ref >60)
Glucose, Bld: 125 mg/dL — ABNORMAL HIGH (ref 70–99)
Potassium: 4.2 mmol/L (ref 3.5–5.1)
Sodium: 137 mmol/L (ref 135–145)
Total Bilirubin: 0.7 mg/dL (ref 0.3–1.2)
Total Protein: 8.1 g/dL (ref 6.5–8.1)

## 2019-06-09 LAB — GLUCOSE, CAPILLARY
Glucose-Capillary: 107 mg/dL — ABNORMAL HIGH (ref 70–99)
Glucose-Capillary: 108 mg/dL — ABNORMAL HIGH (ref 70–99)
Glucose-Capillary: 111 mg/dL — ABNORMAL HIGH (ref 70–99)
Glucose-Capillary: 113 mg/dL — ABNORMAL HIGH (ref 70–99)
Glucose-Capillary: 120 mg/dL — ABNORMAL HIGH (ref 70–99)
Glucose-Capillary: 96 mg/dL (ref 70–99)

## 2019-06-09 MED ORDER — OSMOLITE 1.5 CAL PO LIQD: 1000.0000 mL | ORAL | Status: DC

## 2019-06-09 MED ORDER — METHYLPHENIDATE HCL 5 MG PO TABS
5.0000 mg | ORAL_TABLET | Freq: Two times a day (BID) | ORAL | Status: DC
  Administered 2019-06-09: 5 mg via NASOGASTRIC
  Filled 2019-06-09: qty 1

## 2019-06-09 MED ORDER — QUETIAPINE FUMARATE 50 MG PO TABS
50.0000 mg | ORAL_TABLET | Freq: Every day | ORAL | Status: DC
  Administered 2019-06-09: 50 mg via NASOGASTRIC
  Filled 2019-06-09: qty 1

## 2019-06-09 MED ORDER — OSMOLITE 1.5 CAL PO LIQD
1000.0000 mL | ORAL | Status: DC
  Filled 2019-06-09: qty 1000

## 2019-06-09 MED ORDER — LORAZEPAM 1 MG PO TABS
2.0000 mg | ORAL_TABLET | ORAL | Status: DC | PRN
  Administered 2019-06-09 (×2): 2 mg via ORAL
  Filled 2019-06-09 (×3): qty 2

## 2019-06-09 NOTE — Progress Notes (Signed)
Modified Barium Swallow Progress Note  Patient Details  Name: Luke Choi MRN: [id number] Date of Birth: Sep 29, 1970  Today's Date: 06/09/2019  Modified Barium Swallow completed.  Full report located under Chart Review in the Imaging Section.  Brief recommendations include the following:  Clinical Impression    Pt presents with mild oropharyngeal dysphagia. Pt's oral phase is c/b decreased lingual manipulation and prolonged oral transit. This is likely multifactoral in nature as pt has been NPO for 60 days and is also likely related to internal distractions related to anxiety surrounding study. Pt required lots of encouragment, emotional support with this wirter and his wife during study d/t high anxiety levels, restlessness and difficulty overall coping with task. Pt's pharyngeal phase demonstrates mildly delayed swallow initiation with thin liquids to pyriform sinuses and as a result pt with penetration to cords when consuming thin liquids via straw. However, when consuming honey thick, nectar thick and thin liquids via cup, pt with good airway protrection. Throughout study, pt had mild pharyngeal residue in vallecula and pyriform sinuses, suspect if Cortrak is removed, pt with experience decreased residuals. Due to pt's anxiety level, trials were only administered with PMSV in PLACE. While pt continues to be at higher risk of aspiration, pt is appropriate for return to PO intake with recommendation of dysphagia 1 with thin liquids via cup, oral care prior to intake to reduce bacterial load, PMSV in place, and full supervision.     Swallow Evaluation Recommendations       SLP Diet Recommendations: Dysphagia 1 (Puree) solids;Thin liquid   Liquid Administration via: Cup   Medication Administration: Crushed with puree   Supervision: Staff to assist with self feeding;Full supervision/cueing for compensatory strategies;Full assist for feeding   Compensations: Minimize environmental  distractions;Slow rate;Small sips/bites;Other (Comment)(PMSV must in place, NO straws)   Postural Changes: Seated upright at 90 degrees   Oral Care Recommendations: Oral care before and after PO        Bricia Taher 06/09/2019,4:57 PM
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
## 2019-06-09 NOTE — Progress Notes (Signed)
Pt was found in bed with trach pulled out, condom cath pulled off, with all the covers in the floor. No signs of distress, O2 stat 90%. RT was called to reinsert trach. Pt stated multiple times he did not want it back in, pt was educated by RT on importance of leaving it in. Pt was very  Anxious, IV ativan was given as PRN order at 0215, RT placed trach in, pt continues to be irritable and anxious. Pt kicking legs, hanging them over the side rails. When asked the pt stated that his legs were hurting, but unable to identify exaction location. Robaxin and Oxy 5 mg given to help with the pain. Pt still restless in bed at this time, will continue to monitor closely.

## 2019-06-09 NOTE — Progress Notes (Signed)
Occupational Therapy Session Note  Patient Details  Name: Luke Choi MRN: [id number] Date of Birth: 10/07/1970  Today's Date: 06/09/2019 OT Individual Time: 1420-1445 OT Individual Time Calculation (min): 25 min    Short Term Goals: Week 1:  OT Short Term Goal 1 (Week 1): Pt with attend to one grooming task with max VC OT Short Term Goal 2 (Week 1): Pt will initate rolling with max VC to demonstrate improved command following for LB dressing OT Short Term Goal 3 (Week 1): Pt will complete 1/4 steps for UB dressing with max A  Skilled Therapeutic Interventions/Progress Updates:  1:1. Pt received in bed with fiance present. Educated fiance on decreasing stimulation to decrease aggitation/sensory input via turning TV off, turning lights off, closing blinds and decreasing sounds by keeping door shut. Pt just received ativan and agreeable to OT by verbalizing "yes" pt states, "no" in response to pain. Pt able to select preferred clothing in field of 2 at midline by pointing and stating pants v shorts. Pt requires total +2 A for donning pants at bed level rolling for clothing management and shirt at EOB with +2 providing total A for sitting balance. Pt unable to follow any commands requirng HOH A for all functional steps of dressing. Exited session with pt seated in bed exit alarm on and call lightin reach Therapy Documentation Precautions:  Precautions Precautions: Fall Precaution Comments: per trauma beyond 6wk restriction for hip abduction so no ROM restrictions Required Braces or Orthoses: Other Brace(RLE CAM BOOT in WB) Cervical Brace: Hard collar, At all times Other Brace: Rt CAM boot Restrictions Weight Bearing Restrictions: Yes RUE Weight Bearing: Weight bearing as tolerated(shoulder sling for comfort) RLE Weight Bearing: Weight bearing as tolerated LLE Weight Bearing: Weight bearing as tolerated General:   Vital Signs: Therapy Vitals Temp: 97.7 F (36.5 C) Temp Source:  Axillary Pulse Rate: (!) 131 Resp: (!) 30 BP: 128/89 Patient Position (if appropriate): Lying Oxygen Therapy SpO2: 92 % O2 Device: Tracheostomy Collar O2 Flow Rate (L/min): 8 L/min FiO2 (%): 35 % Pain:   ADL: ADL Eating: Unable to assess Grooming: Unable to assess Upper Body Bathing: Unable to assess Lower Body Bathing: Unable to assess Upper Body Dressing: Unable to assess Lower Body Dressing: Unable to assess Toileting: Unable to assess Toilet Transfer Method: Unable to assess Tub/Shower Transfer: Not assessed Social research officer, government: Unable to assess Social research officer, government Method: Unable to assess Vision Baseline Vision/History: Wears glasses Wears Glasses: At all times Patient Visual Report: Other (comment)(difficult to assess 2/2 agitation, will continue to assess) Vision Assessment?: Vision impaired- to be further tested in functional context(difficult to formally assess vision and perception 2/2 agitation, will continue to assess) Perception  Perception: Impaired Praxis Praxis: Impaired Praxis Impairment Details: Perseveration;Motor planning Exercises:   Other Treatments:     Therapy/Group: Individual Therapy  Tonny Branch 06/09/2019, 3:51 PM

## 2019-06-09 NOTE — Progress Notes (Signed)
RT called by RN stating patient had pulled their trach out.  RT came to bedside patient satting 88-90.  RT reinserted new #4 shiley uncuffed without incident.  Patient able to phonate with vitals stable on 8L 35% ATC.  RT checked trach placement with EtCO2 detector.  Positive color change noted.  RT will continue to monitor.

## 2019-06-09 NOTE — Discharge Instructions (Signed)
Inpatient Rehab Discharge Instructions  Luke Choi Discharge date and time: No discharge date for patient encounter.   Activities/Precautions/ Functional Status: Activity: activity as tolerated Diet:  Wound Care: keep wound clean and dry Functional status:  ___ No restrictions     ___ Walk up steps independently ___ 24/7 supervision/assistance   ___ Walk up steps with assistance ___ Intermittent supervision/assistance  ___ Bathe/dress independently ___ Walk with walker     _x__ Bathe/dress with assistance ___ Walk Independently    ___ Shower independently ___ Walk with assistance    ___ Shower with assistance ___ No alcohol     ___ Return to work/school ________  Special Instructions: No driving smoking or alcohol   My questions have been answered and I understand these instructions. I will adhere to these goals and the provided educational materials after my discharge from the hospital.  Patient/Caregiver Signature _______________________________ Date __________  Clinician Signature _______________________________________ Date __________  Please bring this form and your medication list with you to all your follow-up doctor's appointments.

## 2019-06-09 NOTE — Progress Notes (Signed)
Patient information reviewed and entered into eRehab System by Becky Cayden Granholm, PPS coordinator. Information including medical coding, function ability, and quality indicators will be reviewed and updated through discharge.   

## 2019-06-09 NOTE — Progress Notes (Signed)
Pt still very irritable and restless at this time. Pt denies leg pain, but continues to kick his legs. Pt refusing to let this nurse assemble the adult brief. Trach with medium amount of thick, yellow mucous, pt continues to cough during suction. Bed In lowest position. Will continue to monitor.

## 2019-06-09 NOTE — Plan of Care (Signed)
  Problem: Consults Goal: RH BRAIN INJURY PATIENT EDUCATION Description: Description: See Patient Education module for eduction specifics Outcome: Progressing Goal: Skin Care Protocol Initiated - if Braden Score 18 or less Description: If consults are not indicated, leave blank or document N/A Outcome: Progressing Goal: Nutrition Consult-if indicated Outcome: Progressing   Problem: RH BOWEL ELIMINATION Goal: RH STG MANAGE BOWEL WITH ASSISTANCE Description: STG Manage Bowel with mod Assistance. Outcome: Progressing Goal: RH STG MANAGE BOWEL W/MEDICATION W/ASSISTANCE Description: STG Manage Bowel with Medication with mod Assistance. Outcome: Progressing   Problem: RH BLADDER ELIMINATION Goal: RH STG MANAGE BLADDER WITH ASSISTANCE Description: STG Manage Bladder With mod Assistance Outcome: Progressing Goal: RH STG MANAGE BLADDER WITH MEDICATION WITH ASSISTANCE Description: STG Manage Bladder With Medication With mod Assistance. Outcome: Progressing   Problem: RH SKIN INTEGRITY Goal: RH STG SKIN FREE OF INFECTION/BREAKDOWN Description: Skin to remain free from breakdown while on rehab with mod assist. Outcome: Progressing Goal: RH STG MAINTAIN SKIN INTEGRITY WITH ASSISTANCE Description: STG Maintain Skin Integrity With mod Assistance. Outcome: Progressing Goal: RH STG ABLE TO PERFORM INCISION/WOUND CARE W/ASSISTANCE Description: STG Able To Perform Incision/Wound Care With mod Assistance. Outcome: Progressing   Problem: RH SAFETY Goal: RH STG ADHERE TO SAFETY PRECAUTIONS W/ASSISTANCE/DEVICE Description: STG Adhere to Safety Precautions With mod Assistance and appropriate assistive Device. Outcome: Progressing Goal: RH STG DECREASED RISK OF FALL WITH ASSISTANCE Description: STG Decreased Risk of Fall With mod Assistance. Outcome: Progressing   Problem: RH COGNITION-NURSING Goal: RH STG USES MEMORY AIDS/STRATEGIES W/ASSIST TO PROBLEM SOLVE Description: STG Uses Memory  Aids/Strategies With supervision Assistance to Problem Solve. Outcome: Progressing   Problem: RH PAIN MANAGEMENT Goal: RH STG PAIN MANAGED AT OR BELOW PT'S PAIN GOAL Description: <3 on a 0-10 pain scale Outcome: Progressing   Problem: RH KNOWLEDGE DEFICIT BRAIN INJURY Goal: RH STG INCREASE KNOWLEDGE OF SELF CARE AFTER BRAIN INJURY Description: Patient and caregiver will demonstrate knowledge of how to care for injuries at home with mod assist from rehab staff. Outcome: Progressing Goal: RH STG INCREASE KNOWLEDGE OF DYSPHAGIA/FLUID INTAKE Description: Patient and caregiver will demonstrate knowledge of dietary restrictions with mod assist once patient starts on a diet plan. Outcome: Progressing
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
## 2019-06-09 NOTE — Progress Notes (Signed)
Speech Language Pathology Daily Session Note  Patient Details  Name: Aedan Geimer MRN: [id number] Date of Birth: May 28, 1971  Today's Date: 06/09/2019 SLP Individual Time: 7253-6644 SLP Individual Time Calculation (min): 13 min  Short Term Goals: Week 1:  Pt will complete instrumental swallow study to determine ability to return to PO intake.   Skilled Therapeutic Interventions:   Skilled treatment session focused on education following MBS. Education was completed with pt and his wife. Recommend dysphagia 1 diet with thin liquids via cup, oral care prior to PO intake and must have PMSV in place when consuming, fully alert. All questions answered to wife's satisfaction. Information posted in room and education provided to pt's PA and RD with plan established to leave Cortrak in place until consistent PO intake is established with tube feedings in place at night.   Pain    Therapy/Group: Individual Therapy  Akia Desroches 06/09/2019, 4:58 PM
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
## 2019-06-10 ENCOUNTER — Inpatient Hospital Stay (HOSPITAL_COMMUNITY): Payer: BC Managed Care – PPO

## 2019-06-10 ENCOUNTER — Inpatient Hospital Stay (HOSPITAL_COMMUNITY): Payer: BC Managed Care – PPO | Admitting: Physical Therapy

## 2019-06-10 ENCOUNTER — Inpatient Hospital Stay (HOSPITAL_COMMUNITY): Payer: BC Managed Care – PPO | Admitting: Speech Pathology

## 2019-06-10 ENCOUNTER — Inpatient Hospital Stay (HOSPITAL_COMMUNITY)
Admission: AD | Admit: 2019-06-10 | Discharge: 2019-07-05 | DRG: 163 | Disposition: A | Discharge status: Rehabilitation Facility | Payer: BC Managed Care – PPO | Source: Ambulatory Visit | Attending: Surgery | Admitting: Surgery

## 2019-06-10 DIAGNOSIS — Z8709 Personal history of other diseases of the respiratory system: Secondary | ICD-10-CM

## 2019-06-10 DIAGNOSIS — S066X9D Traumatic subarachnoid hemorrhage with loss of consciousness of unspecified duration, subsequent encounter: Secondary | ICD-10-CM | POA: Diagnosis not present

## 2019-06-10 DIAGNOSIS — I82451 Acute embolism and thrombosis of right peroneal vein: Secondary | ICD-10-CM | POA: Diagnosis not present

## 2019-06-10 DIAGNOSIS — J189 Pneumonia, unspecified organism: Secondary | ICD-10-CM | POA: Diagnosis present

## 2019-06-10 DIAGNOSIS — K0889 Other specified disorders of teeth and supporting structures: Secondary | ICD-10-CM | POA: Diagnosis present

## 2019-06-10 DIAGNOSIS — R000 Tachycardia, unspecified: Secondary | ICD-10-CM | POA: Diagnosis not present

## 2019-06-10 DIAGNOSIS — R1310 Dysphagia, unspecified: Secondary | ICD-10-CM | POA: Diagnosis not present

## 2019-06-10 DIAGNOSIS — S270XXA Traumatic pneumothorax, initial encounter: Secondary | ICD-10-CM

## 2019-06-10 DIAGNOSIS — J9383 Other pneumothorax: Principal | ICD-10-CM

## 2019-06-10 DIAGNOSIS — R310 Gross hematuria: Secondary | ICD-10-CM | POA: Diagnosis not present

## 2019-06-10 DIAGNOSIS — J9311 Primary spontaneous pneumothorax: Secondary | ICD-10-CM | POA: Diagnosis not present

## 2019-06-10 DIAGNOSIS — Z833 Family history of diabetes mellitus: Secondary | ICD-10-CM

## 2019-06-10 DIAGNOSIS — S2241XD Multiple fractures of ribs, right side, subsequent encounter for fracture with routine healing: Secondary | ICD-10-CM

## 2019-06-10 DIAGNOSIS — J80 Acute respiratory distress syndrome: Secondary | ICD-10-CM | POA: Diagnosis present

## 2019-06-10 DIAGNOSIS — Z8249 Family history of ischemic heart disease and other diseases of the circulatory system: Secondary | ICD-10-CM

## 2019-06-10 DIAGNOSIS — S069X9S Unspecified intracranial injury with loss of consciousness of unspecified duration, sequela: Secondary | ICD-10-CM | POA: Diagnosis not present

## 2019-06-10 DIAGNOSIS — G44309 Post-traumatic headache, unspecified, not intractable: Secondary | ICD-10-CM | POA: Diagnosis not present

## 2019-06-10 DIAGNOSIS — S066X0A Traumatic subarachnoid hemorrhage without loss of consciousness, initial encounter: Secondary | ICD-10-CM | POA: Diagnosis not present

## 2019-06-10 DIAGNOSIS — S069X2S Unspecified intracranial injury with loss of consciousness of 31 minutes to 59 minutes, sequela: Secondary | ICD-10-CM | POA: Diagnosis not present

## 2019-06-10 DIAGNOSIS — R0902 Hypoxemia: Secondary | ICD-10-CM | POA: Diagnosis present

## 2019-06-10 DIAGNOSIS — Z79890 Hormone replacement therapy (postmenopausal): Secondary | ICD-10-CM

## 2019-06-10 DIAGNOSIS — I82411 Acute embolism and thrombosis of right femoral vein: Secondary | ICD-10-CM | POA: Diagnosis not present

## 2019-06-10 DIAGNOSIS — J439 Emphysema, unspecified: Secondary | ICD-10-CM | POA: Diagnosis not present

## 2019-06-10 DIAGNOSIS — Z4682 Encounter for fitting and adjustment of non-vascular catheter: Secondary | ICD-10-CM | POA: Diagnosis not present

## 2019-06-10 DIAGNOSIS — Z452 Encounter for adjustment and management of vascular access device: Secondary | ICD-10-CM | POA: Diagnosis not present

## 2019-06-10 DIAGNOSIS — Z8042 Family history of malignant neoplasm of prostate: Secondary | ICD-10-CM

## 2019-06-10 DIAGNOSIS — Z20828 Contact with and (suspected) exposure to other viral communicable diseases: Secondary | ICD-10-CM | POA: Diagnosis present

## 2019-06-10 DIAGNOSIS — R451 Restlessness and agitation: Secondary | ICD-10-CM | POA: Diagnosis not present

## 2019-06-10 DIAGNOSIS — Z7901 Long term (current) use of anticoagulants: Secondary | ICD-10-CM

## 2019-06-10 DIAGNOSIS — I10 Essential (primary) hypertension: Secondary | ICD-10-CM | POA: Diagnosis present

## 2019-06-10 DIAGNOSIS — Z79899 Other long term (current) drug therapy: Secondary | ICD-10-CM

## 2019-06-10 DIAGNOSIS — Z7982 Long term (current) use of aspirin: Secondary | ICD-10-CM

## 2019-06-10 DIAGNOSIS — S069X3S Unspecified intracranial injury with loss of consciousness of 1 hour to 5 hours 59 minutes, sequela: Secondary | ICD-10-CM | POA: Diagnosis not present

## 2019-06-10 DIAGNOSIS — J939 Pneumothorax, unspecified: Secondary | ICD-10-CM

## 2019-06-10 DIAGNOSIS — I82441 Acute embolism and thrombosis of right tibial vein: Secondary | ICD-10-CM | POA: Diagnosis not present

## 2019-06-10 DIAGNOSIS — Z938 Other artificial opening status: Secondary | ICD-10-CM

## 2019-06-10 DIAGNOSIS — Z930 Tracheostomy status: Secondary | ICD-10-CM | POA: Diagnosis not present

## 2019-06-10 DIAGNOSIS — J8410 Pulmonary fibrosis, unspecified: Secondary | ICD-10-CM | POA: Diagnosis present

## 2019-06-10 DIAGNOSIS — J95811 Postprocedural pneumothorax: Secondary | ICD-10-CM | POA: Diagnosis not present

## 2019-06-10 DIAGNOSIS — J438 Other emphysema: Secondary | ICD-10-CM | POA: Diagnosis not present

## 2019-06-10 DIAGNOSIS — G4700 Insomnia, unspecified: Secondary | ICD-10-CM | POA: Diagnosis present

## 2019-06-10 DIAGNOSIS — S069X2D Unspecified intracranial injury with loss of consciousness of 31 minutes to 59 minutes, subsequent encounter: Secondary | ICD-10-CM | POA: Diagnosis not present

## 2019-06-10 DIAGNOSIS — T797XXD Traumatic subcutaneous emphysema, subsequent encounter: Secondary | ICD-10-CM | POA: Diagnosis not present

## 2019-06-10 DIAGNOSIS — J60 Coalworker's pneumoconiosis: Secondary | ICD-10-CM | POA: Diagnosis not present

## 2019-06-10 DIAGNOSIS — S8251XD Displaced fracture of medial malleolus of right tibia, subsequent encounter for closed fracture with routine healing: Secondary | ICD-10-CM | POA: Diagnosis not present

## 2019-06-10 DIAGNOSIS — G44319 Acute post-traumatic headache, not intractable: Secondary | ICD-10-CM | POA: Diagnosis not present

## 2019-06-10 DIAGNOSIS — K5900 Constipation, unspecified: Secondary | ICD-10-CM | POA: Diagnosis not present

## 2019-06-10 DIAGNOSIS — D62 Acute posthemorrhagic anemia: Secondary | ICD-10-CM | POA: Diagnosis not present

## 2019-06-10 DIAGNOSIS — G9389 Other specified disorders of brain: Secondary | ICD-10-CM | POA: Diagnosis present

## 2019-06-10 DIAGNOSIS — Z91030 Bee allergy status: Secondary | ICD-10-CM | POA: Diagnosis not present

## 2019-06-10 DIAGNOSIS — J984 Other disorders of lung: Secondary | ICD-10-CM | POA: Diagnosis not present

## 2019-06-10 DIAGNOSIS — J9690 Respiratory failure, unspecified, unspecified whether with hypoxia or hypercapnia: Secondary | ICD-10-CM

## 2019-06-10 DIAGNOSIS — Z811 Family history of alcohol abuse and dependence: Secondary | ICD-10-CM

## 2019-06-10 DIAGNOSIS — Z87820 Personal history of traumatic brain injury: Secondary | ICD-10-CM

## 2019-06-10 DIAGNOSIS — N390 Urinary tract infection, site not specified: Secondary | ICD-10-CM | POA: Diagnosis not present

## 2019-06-10 DIAGNOSIS — Z9689 Presence of other specified functional implants: Secondary | ICD-10-CM

## 2019-06-10 DIAGNOSIS — Z09 Encounter for follow-up examination after completed treatment for conditions other than malignant neoplasm: Secondary | ICD-10-CM

## 2019-06-10 DIAGNOSIS — Z781 Physical restraint status: Secondary | ICD-10-CM

## 2019-06-10 DIAGNOSIS — S72114S Nondisplaced fracture of greater trochanter of right femur, sequela: Secondary | ICD-10-CM | POA: Diagnosis not present

## 2019-06-10 DIAGNOSIS — S299XXA Unspecified injury of thorax, initial encounter: Secondary | ICD-10-CM | POA: Diagnosis not present

## 2019-06-10 DIAGNOSIS — J9811 Atelectasis: Secondary | ICD-10-CM | POA: Diagnosis not present

## 2019-06-10 DIAGNOSIS — S270XXD Traumatic pneumothorax, subsequent encounter: Secondary | ICD-10-CM | POA: Diagnosis not present

## 2019-06-10 DIAGNOSIS — J9382 Other air leak: Secondary | ICD-10-CM | POA: Diagnosis present

## 2019-06-10 DIAGNOSIS — Z86718 Personal history of other venous thrombosis and embolism: Secondary | ICD-10-CM

## 2019-06-10 DIAGNOSIS — S8251XS Displaced fracture of medial malleolus of right tibia, sequela: Secondary | ICD-10-CM | POA: Diagnosis not present

## 2019-06-10 DIAGNOSIS — Z789 Other specified health status: Secondary | ICD-10-CM | POA: Diagnosis not present

## 2019-06-10 DIAGNOSIS — F1010 Alcohol abuse, uncomplicated: Secondary | ICD-10-CM | POA: Diagnosis present

## 2019-06-10 DIAGNOSIS — Z8261 Family history of arthritis: Secondary | ICD-10-CM

## 2019-06-10 DIAGNOSIS — S42101D Fracture of unspecified part of scapula, right shoulder, subsequent encounter for fracture with routine healing: Secondary | ICD-10-CM | POA: Diagnosis not present

## 2019-06-10 DIAGNOSIS — Z7409 Other reduced mobility: Secondary | ICD-10-CM | POA: Diagnosis not present

## 2019-06-10 DIAGNOSIS — I82431 Acute embolism and thrombosis of right popliteal vein: Secondary | ICD-10-CM | POA: Diagnosis not present

## 2019-06-10 DIAGNOSIS — Z8349 Family history of other endocrine, nutritional and metabolic diseases: Secondary | ICD-10-CM

## 2019-06-10 DIAGNOSIS — S2243XD Multiple fractures of ribs, bilateral, subsequent encounter for fracture with routine healing: Secondary | ICD-10-CM | POA: Diagnosis not present

## 2019-06-10 DIAGNOSIS — F329 Major depressive disorder, single episode, unspecified: Secondary | ICD-10-CM | POA: Diagnosis present

## 2019-06-10 DIAGNOSIS — Z751 Person awaiting admission to adequate facility elsewhere: Secondary | ICD-10-CM

## 2019-06-10 DIAGNOSIS — S72111D Displaced fracture of greater trochanter of right femur, subsequent encounter for closed fracture with routine healing: Secondary | ICD-10-CM

## 2019-06-10 HISTORY — DX: Pneumothorax, unspecified: J93.9

## 2019-06-10 LAB — CBC WITH DIFFERENTIAL/PLATELET
Abs Immature Granulocytes: 0.1 10*3/uL — ABNORMAL HIGH (ref 0.00–0.07)
Basophils Absolute: 0.1 10*3/uL (ref 0.0–0.1)
Basophils Relative: 1 %
Eosinophils Absolute: 0.3 10*3/uL (ref 0.0–0.5)
Eosinophils Relative: 2 %
HCT: 36.7 % — ABNORMAL LOW (ref 39.0–52.0)
Hemoglobin: 11.4 g/dL — ABNORMAL LOW (ref 13.0–17.0)
Immature Granulocytes: 1 %
Lymphocytes Relative: 8 %
Lymphs Abs: 1.3 10*3/uL (ref 0.7–4.0)
MCH: 28.3 pg (ref 26.0–34.0)
MCHC: 31.1 g/dL (ref 30.0–36.0)
MCV: 91.1 fL (ref 80.0–100.0)
Monocytes Absolute: 1.2 10*3/uL — ABNORMAL HIGH (ref 0.1–1.0)
Monocytes Relative: 7 %
Neutro Abs: 13.8 10*3/uL — ABNORMAL HIGH (ref 1.7–7.7)
Neutrophils Relative %: 81 %
Platelets: 511 10*3/uL — ABNORMAL HIGH (ref 150–400)
RBC: 4.03 MIL/uL — ABNORMAL LOW (ref 4.22–5.81)
RDW: 15.6 % — ABNORMAL HIGH (ref 11.5–15.5)
WBC: 16.7 10*3/uL — ABNORMAL HIGH (ref 4.0–10.5)
nRBC: 0 % (ref 0.0–0.2)

## 2019-06-10 LAB — BLOOD GAS, ARTERIAL
Acid-Base Excess: 1.1 mmol/L (ref 0.0–2.0)
Bicarbonate: 23.9 mmol/L (ref 20.0–28.0)
FIO2: 1
O2 Saturation: 89.9 %
Patient temperature: 37
pCO2 arterial: 29.8 mmHg — ABNORMAL LOW (ref 32.0–48.0)
pH, Arterial: 7.514 — ABNORMAL HIGH (ref 7.350–7.450)
pO2, Arterial: 56.8 mmHg — ABNORMAL LOW (ref 83.0–108.0)

## 2019-06-10 LAB — GLUCOSE, CAPILLARY
Glucose-Capillary: 109 mg/dL — ABNORMAL HIGH (ref 70–99)
Glucose-Capillary: 129 mg/dL — ABNORMAL HIGH (ref 70–99)
Glucose-Capillary: 97 mg/dL (ref 70–99)

## 2019-06-10 LAB — BASIC METABOLIC PANEL
Anion gap: 12 (ref 5–15)
BUN: 23 mg/dL — ABNORMAL HIGH (ref 6–20)
CO2: 23 mmol/L (ref 22–32)
Calcium: 9.1 mg/dL (ref 8.9–10.3)
Chloride: 98 mmol/L (ref 98–111)
Creatinine, Ser: 0.52 mg/dL — ABNORMAL LOW (ref 0.61–1.24)
GFR calc Af Amer: >60 mL/min (ref >60)
GFR calc non Af Amer: >60 mL/min (ref >60)
Glucose, Bld: 114 mg/dL — ABNORMAL HIGH (ref 70–99)
Potassium: 4.1 mmol/L (ref 3.5–5.1)
Sodium: 133 mmol/L — ABNORMAL LOW (ref 135–145)

## 2019-06-10 MED ORDER — PROCHLORPERAZINE EDISYLATE 10 MG/2ML IJ SOLN: 5.0000 mg | Freq: Four times a day (QID) | INTRAMUSCULAR | Status: DC | PRN

## 2019-06-10 MED ORDER — QUETIAPINE FUMARATE 25 MG PO TABS
25.0000 mg | ORAL_TABLET | Freq: Two times a day (BID) | ORAL | Status: DC
  Administered 2019-06-10 – 2019-06-12 (×6): 25 mg via ORAL
  Filled 2019-06-10 (×6): qty 1

## 2019-06-10 MED ORDER — JUVEN PO PACK
1.0000 | PACK | Freq: Two times a day (BID) | ORAL | Status: DC
  Administered 2019-06-11 – 2019-06-26 (×28): 1
  Filled 2019-06-10 (×34): qty 1

## 2019-06-10 MED ORDER — LIDOCAINE HCL (PF) 1 % IJ SOLN
INTRAMUSCULAR | Status: AC
  Administered 2019-06-10: 17:00:00
  Filled 2019-06-10: qty 5

## 2019-06-10 MED ORDER — OSMOLITE 1.5 CAL PO LIQD
1000.0000 mL | Freq: Once | ORAL | Status: DC
  Administered 2019-06-10: 1000 mL
  Filled 2019-06-10: qty 1000

## 2019-06-10 MED ORDER — SODIUM CHLORIDE 0.9% FLUSH
3.0000 mL | Freq: Two times a day (BID) | INTRAVENOUS | Status: DC
  Administered 2019-06-10 – 2019-06-15 (×10): 3 mL via INTRAVENOUS

## 2019-06-10 MED ORDER — ACETAMINOPHEN 325 MG PO TABS
650.0000 mg | ORAL_TABLET | ORAL | Status: DC | PRN
  Administered 2019-06-12 – 2019-06-13 (×3): 650 mg via ORAL
  Filled 2019-06-10 (×3): qty 2

## 2019-06-10 MED ORDER — METOPROLOL TARTRATE 50 MG PO TABS
75.0000 mg | ORAL_TABLET | Freq: Three times a day (TID) | ORAL | Status: DC
  Administered 2019-06-10 – 2019-06-15 (×18): 75 mg via ORAL
  Filled 2019-06-10 (×18): qty 1

## 2019-06-10 MED ORDER — OSMOLITE 1.5 CAL PO LIQD
225.0000 mL | Freq: Once | ORAL | Status: DC
  Filled 2019-06-10: qty 237

## 2019-06-10 MED ORDER — HYDROMORPHONE HCL 1 MG/ML IJ SOLN
INTRAMUSCULAR | Status: AC
  Administered 2019-06-10: 2 mg
  Filled 2019-06-10: qty 2

## 2019-06-10 MED ORDER — LIDOCAINE HCL (PF) 1 % IJ SOLN
INTRAMUSCULAR | Status: AC
  Administered 2019-06-10: 30 mL
  Filled 2019-06-10: qty 30

## 2019-06-10 MED ORDER — CLONAZEPAM 0.125 MG PO TBDP
0.2500 mg | ORAL_TABLET | Freq: Two times a day (BID) | ORAL | Status: DC
  Administered 2019-06-10 – 2019-06-11 (×3): 0.25 mg via ORAL
  Filled 2019-06-10 (×3): qty 2

## 2019-06-10 MED ORDER — SODIUM CHLORIDE 0.9 % IV SOLN: 250.0000 mL | INTRAVENOUS | Status: DC | PRN

## 2019-06-10 MED ORDER — LIDOCAINE HCL (PF) 1 % IJ SOLN
INTRAMUSCULAR | Status: AC
  Filled 2019-06-10: qty 30

## 2019-06-10 MED ORDER — OSMOLITE 1.5 CAL PO LIQD
900.0000 mL | ORAL | Status: DC
  Filled 2019-06-10: qty 948

## 2019-06-10 MED ORDER — OSMOLITE 1.5 CAL PO LIQD
900.0000 mL | ORAL | Status: DC
  Administered 2019-06-10: 75 mL
  Filled 2019-06-10 (×2): qty 948

## 2019-06-10 MED ORDER — CHLORHEXIDINE GLUCONATE CLOTH 2 % EX PADS
6.0000 | MEDICATED_PAD | Freq: Every day | CUTANEOUS | Status: DC
  Administered 2019-06-10 – 2019-06-14 (×5): 6 via TOPICAL

## 2019-06-10 MED ORDER — SODIUM CHLORIDE 0.9% FLUSH
3.0000 mL | INTRAVENOUS | Status: DC | PRN
  Administered 2019-06-10: 3 mL via INTRAVENOUS
  Filled 2019-06-10: qty 3

## 2019-06-10 MED ORDER — APIXABAN 5 MG PO TABS
5.0000 mg | ORAL_TABLET | Freq: Two times a day (BID) | ORAL | Status: DC
  Administered 2019-06-10 – 2019-06-14 (×9): 5 mg
  Filled 2019-06-10 (×9): qty 1

## 2019-06-10 MED ORDER — OSMOLITE 1.5 CAL PO LIQD
900.0000 mL | ORAL | Status: DC
  Filled 2019-06-10 (×2): qty 1000

## 2019-06-10 MED ORDER — LIDOCAINE 5 % EX PTCH
1.0000 | MEDICATED_PATCH | CUTANEOUS | Status: DC
  Administered 2019-06-10 – 2019-06-15 (×6): 1 via TRANSDERMAL
  Filled 2019-06-10 (×6): qty 1

## 2019-06-10 MED ORDER — HYDROMORPHONE HCL 1 MG/ML IJ SOLN
0.5000 mg | INTRAMUSCULAR | Status: DC | PRN
  Administered 2019-06-10 (×2): 1 mg via INTRAVENOUS
  Filled 2019-06-10 (×2): qty 1

## 2019-06-10 MED ORDER — PRO-STAT SUGAR FREE PO LIQD
60.0000 mL | Freq: Two times a day (BID) | ORAL | Status: DC
  Administered 2019-06-10 – 2019-06-15 (×10): 60 mL
  Filled 2019-06-10 (×12): qty 60

## 2019-06-10 MED ORDER — PROCHLORPERAZINE MALEATE 10 MG PO TABS
10.0000 mg | ORAL_TABLET | Freq: Four times a day (QID) | ORAL | Status: DC | PRN
  Filled 2019-06-10: qty 1

## 2019-06-10 MED ORDER — ORAL CARE MOUTH RINSE
15.0000 mL | Freq: Two times a day (BID) | OROMUCOSAL | Status: DC
  Administered 2019-06-10 – 2019-06-15 (×8): 15 mL via OROMUCOSAL
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
## 2019-06-10 NOTE — Progress Notes (Signed)
Rehab Admissions Coordinator Note:  Patient was screened by Michel Santee for appropriateness for an Inpatient Acute Rehab Consult.  At this time, we are recommending Inpatient Rehab consult.  Michel Santee 06/10/2019, 5:04 PM  I can be reached at [phone].

## 2019-06-10 NOTE — Progress Notes (Signed)
ANTICOAGULATION CONSULT NOTE - Initial Consult  Pharmacy Consult for Eliquis Indication: pulmonary embolus  Allergies  Allergen Reactions  . Bee Venom Anaphylaxis   Vital Signs: Temp: 99.2 F (37.3 C) (10/30 0303) Temp Source: Axillary (10/29 1927) BP: 114/83 (10/30 0415) Pulse Rate: 132 (10/30 0415)  Labs: Recent Labs    06/07/19 0547 06/09/19 1125  HGB 10.9* 12.2*  HCT 35.4* 39.7  PLT 575* 588*  CREATININE  --  0.45*    Estimated Creatinine Clearance: 114.1 mL/min (A) (by C-G formula based on SCr of 0.45 mg/dL (L)).   Medical History: Past Medical History:  Diagnosis Date  . Chickenpox   . Depression   . Frequent headaches     Assessment/Plan: 48yo male tx'd from inpatient rehab to MICU after pneumothorax seen on imaging >> now s/p chest tube, to continue Eliquis for PE.  Will continue Eliquis 5mg  PO BID.  Pt's wife had been educated on 10/20 regarding Eliquis; will f/u whether further education indicated.  Wynona Neat, PharmD, BCPS  06/10/2019,4:38 AM

## 2019-06-10 NOTE — Progress Notes (Signed)
Pt appeared diaphoretic, restless, o2 stat of 85%, RR of 26, HR 140. Pt had large amounts of mucous coming from trach. RT was contacted. On call provider Algis Liming, PA notified at [place], orders given for chest xray and ABG stat. Dr. Truman Hayward radiologist reported critical finding to [facility].  Rapid Response Mindy ,RN notified at [phone].

## 2019-06-10 NOTE — Progress Notes (Signed)
Pt continues to be restless in bed, constantly moving legs, throwing them over the side rails (not attempting to get out of bed). When asked if his legs hurt, pt stated "they feel weird". PRN Robaxin given. Pt O2 is 84-87%, after suctioning. RT notified for deep suction. Will continue to monitor.
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
## 2019-06-10 NOTE — Progress Notes (Addendum)
RN called RT to come and assess pt as he seemed to be in acute respiratory distress. Pt increased to 10L 98% on ATC from 8L and 35% with SATS maintaining from 88%-91%. HR has increased to 122, RR has increased to 28-36. Pt has decreased breath sounds on LUL and absent breath sounds in LLL. Pt's trach suction and assess for patency. Trach and inner cannula in good condition. Small, thick, yellow/white secretions suctioned out. RT notified RN of need for STAT CXR and notify MD. RT will continue to be available.

## 2019-06-10 NOTE — Progress Notes (Addendum)
Pt was transferred to 35M, Rm 2, by RT Zenia Resides and Rapid Response Mindy RN, Giara Mcgaughey LPN.
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
## 2019-06-10 NOTE — Progress Notes (Signed)
CXR/ABG ordered due to hypoxia requiring 100% and RT reporting lack of BS on left. CXR shows large L-PTX with some shift. Discussed with Dr. Jimmy Footman who recommends transfer to [facility] and they can be consulted in needed but she would prefer consulting Trauma for CT placement as they have placed prior CT. Dr. [name] on call in OR and will call back once out from procedure--approx 30 minutes.   Had nurse call [facility] who relayed that patient declining with tachycardia and tachypnea RR 30--contacted Dr. Jimmy Footman back. She relayed that she would send PCCM provider up to see patient to assist [facility]. Dr. Hal Hope called back and was updated regarding need to transfer to monitored floor He recommended transfer to step down for now and he'll check with Dr. [name] re: admission.  [facility] RN to help transfer patient to step down.
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
## 2019-06-10 NOTE — Op Note (Signed)
Left Chest Tube Insertion Procedure Note  Indications:  Clinically significant Pneumothorax  Pre-operative Diagnosis: Left pneumothorax  Post-operative Diagnosis: left pneumothorax  Procedure Details  Procedure discussed with patient.  Hypoxic and tachycardic.  Assent obtained due to urgency.    After sterile skin prep, using standard technique, a 14 French tube was placed in the left anterior 4th rib space using seldinger technique.  Findings: Large amount of air in chest  Estimated Blood Loss:  Minimal         Specimens:  None              Complications:  None; patient tolerated the procedure well.         Disposition: ICU, trach in place.           Condition: stable  Attending Attestation: I performed the procedure.

## 2019-06-10 NOTE — IPOC Note (Signed)
No IPOC as patient was transferred on second day of rehab admit

## 2019-06-10 NOTE — H&P (Signed)
History   Luke BustleJason Choi is an 48 y.o. male.   Chief Complaint: No chief complaint on file.     49M recent discharge after prolonged hospitalization for multi-system trauma and respiratory failure requiring tracheostomy presents with respiratory distress, hypoxia, tahcypnea, tachcyardia, and absent L sided breath sounds. Found to have a large L PTX requiring chest tube placement.  Past Medical History:  Diagnosis Date  . Chickenpox   . Depression   . Frequent headaches     Past Surgical History:  Procedure Laterality Date  . I&D EXTREMITY Left 04/20/2019   Procedure: IRRIGATION AND DEBRIDEMENT EXTREMITY AND WOUND VAC PLACEMENT;  Surgeon: Tarry KosXu, Naiping M, MD;  Location: [facility] OR;  Service: Orthopedics;  Laterality: Left;  . INCISION AND DRAINAGE Left 04/22/2019   Procedure: INCISION AND DRAINAGE Left lower leg wounds.;  Surgeon: Tarry KosXu, Naiping M, MD;  Location: [facility] OR;  Service: Orthopedics;  Laterality: Left;  . INGUINAL HERNIA REPAIR    . KNEE SURGERY Left    6 times  . LACERATION REPAIR Left 04/20/2019   Procedure: Repair Complex Lacerations;  Surgeon: Tarry KosXu, Naiping M, MD;  [facility];  Service: Orthopedics;  Laterality: Left;  . ORIF ANKLE FRACTURE Right 04/22/2019   Procedure: Open Reduction Internal Fixation (Orif) Medial Malleolus Fracture.;  Surgeon: Tarry KosXu, Naiping M, MD;  Location: [facility] OR;  Service: Orthopedics;  Laterality: Right;    Family History  Problem Relation Age of Onset  . Arthritis Mother   . Hyperlipidemia Mother   . Hypertension Mother   . Heart disease Mother   . Diabetes Mother   . Alcohol abuse Father   . Arthritis Father   . Prostate cancer Father [id number]  . Hyperlipidemia Father   . Hypertension Father   . Heart disease Father   . Diabetes Father   . Heart attack Father [id number]  . Prostate cancer Paternal Uncle   . Arthritis Maternal Grandmother   . Arthritis Maternal Grandfather   . Arthritis Paternal Grandmother   . Arthritis Paternal Grandfather    Social History:   reports that he has never smoked. He has never used smokeless tobacco. He reports current alcohol use. He reports current drug use. Drug: Marijuana.  Allergies   Allergies  Allergen Reactions  . Bee Venom Anaphylaxis    Home Medications   Medications Prior to Admission  Medication Sig Dispense Refill  . aspirin EC 81 MG EC tablet Take 1 tablet (81 mg total) by mouth daily.    . Melatonin 10 MG TABS Take 20 mg by mouth at bedtime.    . Naproxen Sod-diphenhydrAMINE (ALEVE PM PO) Take 1 tablet by mouth at bedtime.    . naproxen sodium (ALEVE) 220 MG tablet Take 220 mg by mouth daily as needed (pain).    . nitroGLYCERIN (NITROSTAT) 0.4 MG SL tablet Place 1 tablet (0.4 mg total) under the tongue every 5 (five) minutes x 3 doses as needed for chest pain. 25 tablet 12  . zolpidem (AMBIEN) 5 MG tablet Take 1 tablet by mouth at bedtime as needed for insomnia 90 tablet 0    Trauma Course   Results for orders placed or performed during the hospital encounter of 06/08/19 (from the past 48 hour(s))  Glucose, capillary     Status: Abnormal   Collection Time: 06/08/19  5:04 PM  Result Value Ref Range   Glucose-Capillary 112 (H) 70 - 99 mg/dL  Glucose, capillary     Status: Abnormal   Collection Time: 06/08/19  8:14  PM  Result Value Ref Range   Glucose-Capillary 128 (H) 70 - 99 mg/dL  Glucose, capillary     Status: Abnormal   Collection Time: 06/09/19 12:06 AM  Result Value Ref Range   Glucose-Capillary 107 (H) 70 - 99 mg/dL  Glucose, capillary     Status: None   Collection Time: 06/09/19  4:22 AM  Result Value Ref Range   Glucose-Capillary 96 70 - 99 mg/dL  Glucose, capillary     Status: Abnormal   Collection Time: 06/09/19  9:23 AM  Result Value Ref Range   Glucose-Capillary 111 (H) 70 - 99 mg/dL  CBC WITH DIFFERENTIAL     Status: Abnormal   Collection Time: 06/09/19 11:25 AM  Result Value Ref Range   WBC 17.0 (H) 4.0 - 10.5 K/uL   RBC 4.32 4.22 - 5.81 MIL/uL   Hemoglobin 12.2 (L)  13.0 - 17.0 g/dL   HCT 39.7 39.0 - 52.0 %   MCV 91.9 80.0 - 100.0 fL   MCH 28.2 26.0 - 34.0 pg   MCHC 30.7 30.0 - 36.0 g/dL   RDW 15.6 (H) 11.5 - 15.5 %   Platelets 588 (H) 150 - 400 K/uL   nRBC 0.0 0.0 - 0.2 %   Neutrophils Relative % 79 %   Neutro Abs 13.6 (H) 1.7 - 7.7 K/uL   Lymphocytes Relative 9 %   Lymphs Abs 1.5 0.7 - 4.0 K/uL   Monocytes Relative 7 %   Monocytes Absolute 1.2 (H) 0.1 - 1.0 K/uL   Eosinophils Relative 3 %   Eosinophils Absolute 0.4 0.0 - 0.5 K/uL   Basophils Relative 1 %   Basophils Absolute 0.1 0.0 - 0.1 K/uL   Immature Granulocytes 1 %   Abs Immature Granulocytes 0.13 (H) 0.00 - 0.07 K/uL    Comment: Performed at [facility], [address], [place], [place] [address]  Comprehensive metabolic panel     Status: Abnormal   Collection Time: 06/09/19 11:25 AM  Result Value Ref Range   Sodium 137 135 - 145 mmol/L   Potassium 4.2 3.5 - 5.1 mmol/L   Chloride 101 98 - 111 mmol/L   CO2 24 22 - 32 mmol/L   Glucose, Bld 125 (H) 70 - 99 mg/dL   BUN 17 6 - 20 mg/dL   Creatinine, Ser 0.45 (L) 0.61 - 1.24 mg/dL   Calcium 9.5 8.9 - 10.3 mg/dL   Total Protein 8.1 6.5 - 8.1 g/dL   Albumin 3.1 (L) 3.5 - 5.0 g/dL   AST 29 15 - 41 U/L   ALT 64 (H) 0 - 44 U/L   Alkaline Phosphatase 168 (H) 38 - 126 U/L   Total Bilirubin 0.7 0.3 - 1.2 mg/dL   GFR calc non Af Amer >60 >60 mL/min   GFR calc Af Amer >60 >60 mL/min   Anion gap 12 5 - 15    Comment: Performed at [facility] [address], [place], [place] [address]  Glucose, capillary     Status: Abnormal   Collection Time: 06/09/19 11:29 AM  Result Value Ref Range   Glucose-Capillary 113 (H) 70 - 99 mg/dL  Glucose, capillary     Status: Abnormal   Collection Time: 06/09/19  4:03 PM  Result Value Ref Range   Glucose-Capillary 120 (H) 70 - 99 mg/dL  Glucose, capillary     Status: Abnormal   Collection Time: 06/09/19  8:14 PM  Result Value Ref Range   Glucose-Capillary 108 (  H) 70 - 99 mg/dL  Glucose,  capillary     Status: Abnormal   Collection Time: 06/10/19 12:26 AM  Result Value Ref Range   Glucose-Capillary 109 (H) 70 - 99 mg/dL  Blood gas, arterial     Status: Abnormal   Collection Time: 06/10/19  2:30 AM  Result Value Ref Range   FIO2 1.00    pH, Arterial 7.514 (H) 7.350 - 7.450   pCO2 arterial 29.8 (L) 32.0 - 48.0 mmHg   pO2, Arterial 56.8 (L) 83.0 - 108.0 mmHg   Bicarbonate 23.9 20.0 - 28.0 mmol/L   Acid-Base Excess 1.1 0.0 - 2.0 mmol/L   O2 Saturation 89.9 %   Patient temperature 37.0    Collection site LEFT RADIAL    Sample type ARTERIAL DRAW    Allens test (pass/fail) PASS PASS    Comment: Performed at [facility], [address], [place], [place] [address]   Dg Chest 2 View  Result Date: 06/10/2019 CLINICAL DATA:  Diminished breath sounds. EXAM: CHEST - 2 VIEW COMPARISON:  Chest radiograph 06/07/2019 FINDINGS: There is a large left pneumothorax with complete collapse of the left lung. There is mild rightward shift of the mediastinum. There is hazy opacity throughout the right lung. Tracheostomy tube tip is at the level of the clavicular heads. IMPRESSION: 1. Large left pneumothorax with complete collapse of the left lung and mild right mediastinal shift. 2. Hazy opacity throughout the right lung. Critical Value/emergent results were called by telephone at the time of interpretation on 06/10/2019 at 2:55 am to providerPAMELA LOVE , who verbally acknowledged these results. Electronically Signed   By: Deatra Robinson M.D.   On: 06/10/2019 02:55    ROS  10 point review of systems negative except as listed in H&P  Blood pressure 103/75, pulse (!) 122, resp. rate (!) 25, SpO2 96 %. Physical Exam  Gen: comfortable Neuro: calm, non-focal exam HEENT: trach site c/d/i Neck: supple CV: RRR Pulm: L chest tube on 20cm wall suction with one column, intermittent air leak Abd: soft, NT Extr: wwp, no edema   Assessment/Plan To OR this AM for tube thoracostomy. Tube on  suction, but with intermittent one column air leak. Near complete re-expansion on post-placement CXR. Will c/s TCTS given history of repeat PTX (although this is the first time on the left side) and bleb disease on CT chest.  Diamantina Monks, MD General and Trauma Surgery [facility]

## 2019-06-10 NOTE — Progress Notes (Signed)
Radiology called this nurse and informed me of CXR results. Trauma PA informed and aware and looking at CXR results.

## 2019-06-10 NOTE — Progress Notes (Signed)
Summoned by LPN due to patient having a continuous desaturation event to the lower 80's on 35% ATC. Appreciate her assessment. Patient appeared to be restless with increased WOB, diaphoretic, and Rhonchi on the right side; left side severely diminished. Pt's fiO2 increased to 100%; suctioned with moderate whitish yellow secretions removed. Patient was then transported to radiology for an x-ray where a pneumothorax was discovered. PA-C on call aware, rapid response RN present, currently awaiting orders.

## 2019-06-10 NOTE — Discharge Summary (Deleted)
  The note originally documented on this encounter has been moved the the encounter in which it belongs.  

## 2019-06-10 NOTE — Significant Event (Signed)
Rapid Response Event Note  Overview:  Called d/t pneumothorax, SpO2-84% on .98 TC. Pt became restless in bed overnight and began to drop SpO2 to 80s on .35 TC. Pt taken for xray showing 1. Large left pneumothorax with complete collapse of the left lung and mild right mediastinal shift. 2. Hazy opacity throughout the right lung,  and an ABG was drawn:7.51/29.8/56.8/23.9. Time Called: 0247 Arrival Time: 0249 Event Type: Respiratory  Initial Focused Assessment: Pt laying in bed, very restless, in respiratory distress. Pt says he's SOB. HR-137, SBP-150s, RR-35, SpO2-86% on .98 TC.  L upper lung sounds absent, diminished in base. R lung sounds clear/diminished.   Interventions: Pt moved emergently to 3M02 for chest tube insertion.  Event Summary: Name of Physician Notified: Algis Liming, PA at (PTA RRT)    at          [place], Carren Rang

## 2019-06-11 ENCOUNTER — Inpatient Hospital Stay (HOSPITAL_COMMUNITY): Payer: BC Managed Care – PPO

## 2019-06-11 LAB — GLUCOSE, CAPILLARY: Glucose-Capillary: 108 mg/dL — ABNORMAL HIGH (ref 70–99)

## 2019-06-11 MED ORDER — HYDROMORPHONE HCL 1 MG/ML IJ SOLN
0.5000 mg | Freq: Three times a day (TID) | INTRAMUSCULAR | Status: DC | PRN
  Administered 2019-06-12: 1 mg via INTRAVENOUS
  Filled 2019-06-11: qty 1

## 2019-06-11 MED ORDER — OXYCODONE HCL 5 MG/5ML PO SOLN
5.0000 mg | ORAL | Status: DC | PRN
  Administered 2019-06-12 – 2019-06-14 (×3): 10 mg
  Administered 2019-06-15: 5 mg
  Administered 2019-06-15 – 2019-06-23 (×17): 10 mg
  Administered 2019-06-26: 5 mg
  Administered 2019-06-27 – 2019-07-02 (×4): 10 mg
  Filled 2019-06-11 (×11): qty 10
  Filled 2019-06-11: qty 5
  Filled 2019-06-11 (×4): qty 10
  Filled 2019-06-11: qty 5
  Filled 2019-06-11 (×10): qty 10

## 2019-06-11 MED ORDER — HALOPERIDOL LACTATE 5 MG/ML IJ SOLN
5.0000 mg | Freq: Four times a day (QID) | INTRAMUSCULAR | Status: DC | PRN
  Administered 2019-06-11 – 2019-06-12 (×3): 5 mg via INTRAVENOUS
  Filled 2019-06-11 (×3): qty 1

## 2019-06-11 MED ORDER — CLONAZEPAM 0.5 MG PO TBDP
0.5000 mg | ORAL_TABLET | Freq: Two times a day (BID) | ORAL | Status: DC
  Administered 2019-06-11 – 2019-06-12 (×3): 0.5 mg via ORAL
  Filled 2019-06-11 (×3): qty 1

## 2019-06-11 MED ORDER — LORAZEPAM 2 MG/ML IJ SOLN
0.5000 mg | Freq: Four times a day (QID) | INTRAMUSCULAR | Status: DC | PRN
  Administered 2019-06-11 – 2019-06-12 (×2): 2 mg via INTRAVENOUS
  Filled 2019-06-11 (×2): qty 1

## 2019-06-11 MED ORDER — OSMOLITE 1.5 CAL PO LIQD
1000.0000 mL | ORAL | Status: DC
  Administered 2019-06-11 – 2019-06-15 (×5): 1000 mL
  Filled 2019-06-11 (×6): qty 1000

## 2019-06-11 MED ORDER — LORAZEPAM 2 MG/ML IJ SOLN
2.0000 mg | Freq: Once | INTRAMUSCULAR | Status: AC
  Administered 2019-06-11: 2 mg via INTRAVENOUS
  Filled 2019-06-11: qty 1

## 2019-06-11 NOTE — Progress Notes (Signed)
Walked into patient room and upon assessment the patient had trach out. RT was called and told RN to put trach back into patient until RT could arrive.   Pt. sats were 100% on Room Air. RT found inner cannula in bed and placed it back into the trach.   RN called on call trauma Luke Choi, [place]. Gave order for safety belt and bilateral restraints. Also gave a one time order for ativan to calm patient down. Pt. believes that he has "been kidnapped and it trying to escape."   RN called charge nurse for a sitter and there are none available. 25M floor tech went to sit in the room with patient for safety.

## 2019-06-11 NOTE — Progress Notes (Signed)
RT called because trach had come out. RN was told to put it back in till RT arrival. Upon arrival trach was in, but inner cannula was laying in the bed. Sats were 100%, patient in no distress. RT assessed trachs along with trach ties. Clean inner cannula and inserted it back in. Lurline Idol is now secure. RT will continue to monitor.

## 2019-06-11 NOTE — Progress Notes (Addendum)
Patient given water in cup without straw under supervision of this RN, with PMV in place and patient sitting up in bed at 90 degrees. Patient unable to follow instruction to only take small sips from the cup at a time, this RN had to stop the patient from taking gulps of the approximate 30 mL in the cup. Patient coughing following this episode. Encouraged pt to cough and given airway suctioning, pt O2 sat stable and consistent at 99%.   Attempted PO intake of water again at patient request at 0600, this time with a second cup only offering the patient one sip of water in the drinking cup at a time. Patient frustrated by the process but able to take small sips well without coughing. Will pass on to daytime RN during report.
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
## 2019-06-11 NOTE — Procedures (Addendum)
   Procedure Note  Date: 06/10/2019  Procedure: tube thoracostomy--left    Pre-op diagnosis: left pneumothorax, recurrent despite 55F pigtail in place, previously functional  Post-op diagnosis: same  Surgeon: Jesusita Oka, MD  Anesthesia: local   EBL: <5cc  Drains/Implants: 46F chest tube Specimen: none  Description of procedure: Time-out was performed verifying correct patient, procedure, site, laterality, and signature of informed consent. THirty cc's of local anesthetic was infiltrated into the tissues just over the fourth intercostal space.  A longitudinal incision was made parallel to the rib at the fourth intercostal space. This incision was deepened down through the muscle until the pleural cavity was entered. An audible air rush was encountered upon entry and a 46F chest tube was inserted through this tract.   The tube was secured at 14cm at the skin with suture and connected to an atrium at -20cm water wall suction. Immediate output from the chest tube was 60cc and was serosanguinous. The site was dressed with xeroform, gauze, and tape. The patient tolerated the procedure well. There were no complications. Follow up chest x-ray was ordered to confirm tube positioning, complete evacuation, and complete lung re-expansion.    Jesusita Oka, MD [facility]

## 2019-06-12 ENCOUNTER — Inpatient Hospital Stay (HOSPITAL_COMMUNITY): Payer: BC Managed Care – PPO

## 2019-06-12 LAB — BASIC METABOLIC PANEL
Anion gap: 13 (ref 5–15)
BUN: 18 mg/dL (ref 6–20)
CO2: 22 mmol/L (ref 22–32)
Calcium: 9 mg/dL (ref 8.9–10.3)
Chloride: 100 mmol/L (ref 98–111)
Creatinine, Ser: 0.43 mg/dL — ABNORMAL LOW (ref 0.61–1.24)
GFR calc Af Amer: >60 mL/min (ref >60)
GFR calc non Af Amer: >60 mL/min (ref >60)
Glucose, Bld: 124 mg/dL — ABNORMAL HIGH (ref 70–99)
Potassium: 3.8 mmol/L (ref 3.5–5.1)
Sodium: 135 mmol/L (ref 135–145)

## 2019-06-12 LAB — CBC
HCT: 33.7 % — ABNORMAL LOW (ref 39.0–52.0)
Hemoglobin: 10.7 g/dL — ABNORMAL LOW (ref 13.0–17.0)
MCH: 28.6 pg (ref 26.0–34.0)
MCHC: 31.8 g/dL (ref 30.0–36.0)
MCV: 90.1 fL (ref 80.0–100.0)
Platelets: 458 10*3/uL — ABNORMAL HIGH (ref 150–400)
RBC: 3.74 MIL/uL — ABNORMAL LOW (ref 4.22–5.81)
RDW: 15 % (ref 11.5–15.5)
WBC: 10.7 10*3/uL — ABNORMAL HIGH (ref 4.0–10.5)
nRBC: 0 % (ref 0.0–0.2)

## 2019-06-12 LAB — MAGNESIUM: Magnesium: 1.9 mg/dL (ref 1.7–2.4)

## 2019-06-12 LAB — PHOSPHORUS: Phosphorus: 4.2 mg/dL (ref 2.5–4.6)

## 2019-06-12 NOTE — Progress Notes (Signed)
Subjective/Chief Complaint: Breathing comfortably.  No complaints   Objective: Vital signs in last 24 hours: Temp:  [97.9 F (36.6 C)-100 F (37.8 C)] 100 F (37.8 C) (11/01 0748) Pulse Rate:  [84-141] 108 (11/01 0800) Resp:  [19-32] 23 (11/01 0800) BP: (96-135)/(66-98) 119/87 (11/01 0800) SpO2:  [97 %-100 %] 100 % (11/01 0800) FiO2 (%):  [28 %-60 %] 28 % (11/01 0800) Last BM Date: 06/09/19  Intake/Output from previous day: 10/31 0701 - 11/01 0700 In: 1420 [P.O.:240; NG/GT:1180] Out: 1575 [Urine:1475; Chest Tube:100] Intake/Output this shift: No intake/output data recorded.  General appearance: alert and cooperative Resp: clear to auscultation bilaterally and trach in place Cardio: regular rhythm, tachy GI: soft, nontender  Lab Results:  Recent Labs    06/10/19 0503 06/12/19 0439  WBC 16.7* 10.7*  HGB 11.4* 10.7*  HCT 36.7* 33.7*  PLT 511* 458*   BMET Recent Labs    06/10/19 0503 06/12/19 0439  NA 133* 135  K 4.1 3.8  CL 98 100  CO2 23 22  GLUCOSE 114* 124*  BUN 23* 18  CREATININE 0.52* 0.43*  CALCIUM 9.1 9.0   PT/INR No results for input(s): LABPROT, INR in the last 72 hours. ABG Recent Labs    06/10/19 0230  PHART 7.514*  HCO3 23.9    Studies/Results: Dg Chest Port 1 View  Result Date: 06/11/2019 CLINICAL DATA:  Follow-up left chest tube and left pneumothorax. EXAM: PORTABLE CHEST 1 VIEW COMPARISON:  Yesterday. FINDINGS: Mild increase in size of the previously demonstrated left apical pneumothorax, currently occupying approximately 10% of the volume of the left hemithorax with an inferior extension and mild increase in adjacent soft tissue air inferiorly. A left chest tube remains in place. Diffuse peribronchial thickening and accentuation of the interstitial markings has not changed significantly. Mild bilateral patchy airspace opacities, involving a larger portion of both lungs with areas of more focal opacity previously demonstrated on  both sides improved. A right scapular fracture is again noted. Satisfactory position of the tracheostomy tube. Feeding tube extending into the stomach. IMPRESSION: 1. Mild increase in size of the previously demonstrated left apical pneumothorax, currently occupying approximately 10% of the volume of the left hemithorax. 2. Mild probable bilateral pneumonia, improved cm areas and worse than others. 3. Stable changes of COPD and chronic bronchitis. 4. Stable right scapular fracture. These results will be called to the ordering clinician or representative by the Radiologist Assistant, and communication documented in the PACS or zVision Dashboard. Electronically Signed   By: Beckie Salts M.D.   On: 06/11/2019 07:40   Dg Chest Port 1 View  Result Date: 06/10/2019 CLINICAL DATA:  New chest tube EXAM: PORTABLE CHEST 1 VIEW COMPARISON:  Radiograph 06/10/2019 FINDINGS: Tracheostomy tube terminates in the mid trachea. Transesophageal tube terminates distal to the GE junction. Interval replacement of the left-sided pigtail catheter with a large 4 left pleural drain. Tube terminates medially in the left mid lung directed towards the apex. Small residual apical pneumothorax is present (see annotated image). There is residual airspace disease in both upper lungs and in the left mid lung compatible with known multifocal pneumonia. Some increased in past the could be attributed to re-expansion edematous change. Degenerative changes are present in the imaged spine and shoulders. Mild subcutaneous emphysema of the left chest wall. IMPRESSION: Replacement of the left pigtail pleural drain with a larger bore pleural catheter. Mild subcutaneous emphysema of the left chest wall. Re-expansion of the left lung with small residual left apical pneumothorax.  Persistent bilateral airspace opacities compatible multifocal pneumonia. Some increased attenuation left upper lung could reflect some mild re-expansion edema Electronically Signed    By: Lovena Le M.D.   On: 06/10/2019 16:33   Dg Chest Port 1 View  Result Date: 06/10/2019 CLINICAL DATA:  Pneumothorax, increased wall suction EXAM: PORTABLE CHEST 1 VIEW COMPARISON:  06/10/2019, 3:07 p.m. FINDINGS: No significant interval change in large, approximately 70% left pneumothorax with left-sided pigtail chest tube in position. The right lung is unchanged in appearance with dense heterogeneous interstitial opacity. Tracheostomy. Partially imaged enteric feeding tube. The heart and mediastinum are normal. IMPRESSION: 1. No significant interval change in large, approximately 70% left pneumothorax with left-sided pigtail chest tube in position. 2. The right lung is unchanged in appearance with dense heterogeneous interstitial opacity. 3.  Tracheostomy. Electronically Signed   By: Eddie Candle M.D.   On: 06/10/2019 15:34   Dg Chest Port 1 View  Result Date: 06/10/2019 CLINICAL DATA:  Chest tube. EXAM: PORTABLE CHEST 1 VIEW COMPARISON:  Prior chest x-ray same day. FINDINGS: Tracheostomy tube and feeding tube in stable position. Left chest tube in stable position. Severe progression of left-sided pneumothorax with near complete collapse of left lung. Tension may be present. By history the patient's nurse is aware of the above findings. Diffuse bilateral pulmonary infiltrates. Heart size normal. IMPRESSION: 1.  Lines and tubes including left chest tube in stable position. 2. Severe progression of left-sided pneumothorax with near complete collapse of left lung. Component of tension may be present. By history patient's caregiver is aware of these findings. 3.  Diffuse bilateral pulmonary infiltrates. Critical Value/emergent results were called by telephone at the time of interpretation on 06/10/2019 at 3:20 pm to nurse Danae Chen , who verbally acknowledged these results. Electronically Signed   By: Marcello Moores  Register   On: 06/10/2019 15:22    Anti-infectives: Anti-infectives (From admission, onward)    None      Assessment/Plan: s/p Chest tube placement for L ptx Chest tube suction at 30cm h2o, will recheck CXR this AM Continue pulm toilet Cont tube feeds for nutrition   LOS: 2 days    Rosario Adie [id number]

## 2019-06-13 ENCOUNTER — Inpatient Hospital Stay (HOSPITAL_COMMUNITY): Payer: BC Managed Care – PPO

## 2019-06-13 DIAGNOSIS — S270XXA Traumatic pneumothorax, initial encounter: Secondary | ICD-10-CM | POA: Diagnosis not present

## 2019-06-13 DIAGNOSIS — J939 Pneumothorax, unspecified: Secondary | ICD-10-CM | POA: Diagnosis not present

## 2019-06-13 DIAGNOSIS — J9690 Respiratory failure, unspecified, unspecified whether with hypoxia or hypercapnia: Secondary | ICD-10-CM | POA: Diagnosis not present

## 2019-06-13 LAB — BASIC METABOLIC PANEL
Anion gap: 9 (ref 5–15)
BUN: 16 mg/dL (ref 6–20)
CO2: 25 mmol/L (ref 22–32)
Calcium: 8.9 mg/dL (ref 8.9–10.3)
Chloride: 101 mmol/L (ref 98–111)
Creatinine, Ser: 0.41 mg/dL — ABNORMAL LOW (ref 0.61–1.24)
GFR calc Af Amer: >60 mL/min (ref >60)
GFR calc non Af Amer: >60 mL/min (ref >60)
Glucose, Bld: 101 mg/dL — ABNORMAL HIGH (ref 70–99)
Potassium: 4 mmol/L (ref 3.5–5.1)
Sodium: 135 mmol/L (ref 135–145)

## 2019-06-13 LAB — CBC
HCT: 33 % — ABNORMAL LOW (ref 39.0–52.0)
Hemoglobin: 10.2 g/dL — ABNORMAL LOW (ref 13.0–17.0)
MCH: 28.3 pg (ref 26.0–34.0)
MCHC: 30.9 g/dL (ref 30.0–36.0)
MCV: 91.4 fL (ref 80.0–100.0)
Platelets: 459 10*3/uL — ABNORMAL HIGH (ref 150–400)
RBC: 3.61 MIL/uL — ABNORMAL LOW (ref 4.22–5.81)
RDW: 14.8 % (ref 11.5–15.5)
WBC: 9.4 10*3/uL (ref 4.0–10.5)
nRBC: 0 % (ref 0.0–0.2)

## 2019-06-13 LAB — GLUCOSE, CAPILLARY: Glucose-Capillary: 105 mg/dL — ABNORMAL HIGH (ref 70–99)

## 2019-06-13 LAB — MAGNESIUM: Magnesium: 1.8 mg/dL (ref 1.7–2.4)

## 2019-06-13 LAB — PHOSPHORUS: Phosphorus: 4.2 mg/dL (ref 2.5–4.6)

## 2019-06-13 MED ORDER — CLONAZEPAM 0.125 MG PO TBDP
0.2500 mg | ORAL_TABLET | Freq: Two times a day (BID) | ORAL | Status: DC
  Administered 2019-06-13 (×2): 0.25 mg via ORAL
  Filled 2019-06-13 (×2): qty 2

## 2019-06-13 MED ORDER — MAGNESIUM SULFATE 2 GM/50ML IV SOLN
2.0000 g | Freq: Once | INTRAVENOUS | Status: AC
  Administered 2019-06-13: 2 g via INTRAVENOUS
  Filled 2019-06-13: qty 50

## 2019-06-13 MED ORDER — QUETIAPINE FUMARATE 50 MG PO TABS
50.0000 mg | ORAL_TABLET | Freq: Two times a day (BID) | ORAL | Status: DC
  Administered 2019-06-13 (×2): 50 mg via ORAL
  Filled 2019-06-13 (×3): qty 1

## 2019-06-13 MED ORDER — HALOPERIDOL LACTATE 5 MG/ML IJ SOLN
5.0000 mg | Freq: Three times a day (TID) | INTRAMUSCULAR | Status: DC | PRN
  Administered 2019-06-13 – 2019-06-14 (×3): 5 mg via INTRAVENOUS
  Filled 2019-06-13 (×3): qty 1

## 2019-06-13 MED ORDER — LORAZEPAM 2 MG/ML IJ SOLN
1.0000 mg | Freq: Three times a day (TID) | INTRAMUSCULAR | Status: DC | PRN
  Administered 2019-06-13 – 2019-06-14 (×2): 1 mg via INTRAVENOUS
  Filled 2019-06-13 (×2): qty 1

## 2019-06-13 MED ORDER — HYDROMORPHONE HCL 1 MG/ML IJ SOLN
0.5000 mg | Freq: Three times a day (TID) | INTRAMUSCULAR | Status: DC | PRN
  Administered 2019-06-20 – 2019-06-22 (×3): 0.5 mg via INTRAVENOUS
  Filled 2019-06-13 (×3): qty 1
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
## 2019-06-13 NOTE — Progress Notes (Signed)
Spoke with the Trauma/Surgery PA in regards to most recent chest xray. Per her instructions, the chest tube has been put back to  -20 suction.
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
## 2019-06-14 ENCOUNTER — Inpatient Hospital Stay (HOSPITAL_COMMUNITY): Payer: BC Managed Care – PPO

## 2019-06-14 DIAGNOSIS — J9311 Primary spontaneous pneumothorax: Secondary | ICD-10-CM

## 2019-06-14 LAB — BASIC METABOLIC PANEL
Anion gap: 12 (ref 5–15)
BUN: 14 mg/dL (ref 6–20)
CO2: 24 mmol/L (ref 22–32)
Calcium: 9.1 mg/dL (ref 8.9–10.3)
Chloride: 99 mmol/L (ref 98–111)
Creatinine, Ser: 0.5 mg/dL — ABNORMAL LOW (ref 0.61–1.24)
GFR calc Af Amer: >60 mL/min (ref >60)
GFR calc non Af Amer: >60 mL/min (ref >60)
Glucose, Bld: 103 mg/dL — ABNORMAL HIGH (ref 70–99)
Potassium: 4.1 mmol/L (ref 3.5–5.1)
Sodium: 135 mmol/L (ref 135–145)

## 2019-06-14 LAB — CBC
HCT: 39.3 % (ref 39.0–52.0)
Hemoglobin: 12.5 g/dL — ABNORMAL LOW (ref 13.0–17.0)
MCH: 28.7 pg (ref 26.0–34.0)
MCHC: 31.8 g/dL (ref 30.0–36.0)
MCV: 90.3 fL (ref 80.0–100.0)
Platelets: 348 10*3/uL (ref 150–400)
RBC: 4.35 MIL/uL (ref 4.22–5.81)
RDW: 14.8 % (ref 11.5–15.5)
WBC: 10.2 10*3/uL (ref 4.0–10.5)
nRBC: 0 % (ref 0.0–0.2)

## 2019-06-14 LAB — PHOSPHORUS: Phosphorus: 4.1 mg/dL (ref 2.5–4.6)

## 2019-06-14 LAB — MAGNESIUM: Magnesium: 2.1 mg/dL (ref 1.7–2.4)

## 2019-06-14 MED ORDER — CLONAZEPAM 0.5 MG PO TBDP
0.5000 mg | ORAL_TABLET | Freq: Two times a day (BID) | ORAL | Status: DC
  Administered 2019-06-14 – 2019-06-21 (×14): 0.5 mg via ORAL
  Filled 2019-06-14 (×10): qty 1
  Filled 2019-06-14: qty 4
  Filled 2019-06-14 (×3): qty 1

## 2019-06-14 MED ORDER — LIDOCAINE HCL (PF) 1 % IJ SOLN
INTRAMUSCULAR | Status: AC
  Filled 2019-06-14: qty 30

## 2019-06-14 MED ORDER — LORAZEPAM 2 MG/ML IJ SOLN
1.0000 mg | Freq: Four times a day (QID) | INTRAMUSCULAR | Status: DC | PRN
  Administered 2019-06-14 – 2019-06-15 (×3): 1 mg via INTRAVENOUS
  Filled 2019-06-14 (×4): qty 1

## 2019-06-14 MED ORDER — IOHEXOL 300 MG/ML  SOLN
75.0000 mL | Freq: Once | INTRAMUSCULAR | Status: AC | PRN
  Administered 2019-06-14: 75 mL via INTRAVENOUS

## 2019-06-14 MED ORDER — HALOPERIDOL LACTATE 5 MG/ML IJ SOLN
5.0000 mg | Freq: Four times a day (QID) | INTRAMUSCULAR | Status: DC | PRN
  Administered 2019-06-14: 5 mg via INTRAVENOUS
  Administered 2019-06-14 – 2019-06-15 (×3): 10 mg via INTRAVENOUS
  Filled 2019-06-14: qty 2
  Filled 2019-06-14: qty 1
  Filled 2019-06-14 (×2): qty 2

## 2019-06-14 MED ORDER — QUETIAPINE FUMARATE 50 MG PO TABS: 50.0000 mg | ORAL_TABLET | Freq: Once | ORAL | Status: DC

## 2019-06-14 MED ORDER — QUETIAPINE FUMARATE 100 MG PO TABS
100.0000 mg | ORAL_TABLET | Freq: Two times a day (BID) | ORAL | Status: DC
  Administered 2019-06-14 – 2019-06-15 (×4): 100 mg via ORAL
  Filled 2019-06-14 (×3): qty 1
  Filled 2019-06-14: qty 2

## 2019-06-14 MED ORDER — HALOPERIDOL LACTATE 5 MG/ML IJ SOLN: 5.0000 mg | Freq: Four times a day (QID) | INTRAMUSCULAR | Status: DC | PRN

## 2019-06-14 NOTE — Progress Notes (Signed)
Patient ID: Luke Choi, male   DOB: August 02, 1971, 48 y.o.   MRN: [id number] New chest tube in, still a L PTX on CXR - increase suction to -40. CT chest ordered. TCTS to see. Will hold Eliquis and let them know he has been on it.  Georganna Skeans, MD, MPH, FACS Please use [email or web address] to contact on call provider
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
## 2019-06-14 NOTE — Progress Notes (Signed)
PT Cancellation Note  Patient Details Name: Luke Choi MRN: [id number] DOB: [birth year]   Cancelled Treatment:     Pt medically not appropriate for therapy session today. Pt pulled chest tube out and presents with increased agitation. New chest tube placed and pt transferred to different unit. Will follow up for therapy as medically appropriate.     Netta Corrigan, PT, DPT, CSRS 06/14/2019, 3:31 PM

## 2019-06-14 NOTE — Progress Notes (Signed)
Report called and given to Carlton, RN on 4N. All questions answered to satisfaction. Pt to be transferred via bed with all belongings by staff.
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
## 2019-06-14 NOTE — Plan of Care (Signed)
  Problem: Education: Goal: Knowledge of General Education information will improve Description: Including pain rating scale, medication(s)/side effects and non-pharmacologic comfort measures Outcome: Progressing   Problem: Health Behavior/Discharge Planning: Goal: Ability to manage health-related needs will improve Outcome: Progressing   Problem: Clinical Measurements: Goal: Respiratory complications will improve Outcome: Progressing Goal: Cardiovascular complication will be avoided Outcome: Progressing   Problem: Pain Managment: Goal: General experience of comfort will improve Outcome: Progressing   Problem: Safety: Goal: Ability to remain free from injury will improve Outcome: Progressing   Problem: Skin Integrity: Goal: Risk for impaired skin integrity will decrease Outcome: Progressing   

## 2019-06-14 NOTE — Procedures (Signed)
Chest Tube Insertion Procedure Note  Surgeon: Georganna Skeans, MD  Assist: Jackson Latino, PA-C  Indications:  Clinically significant L pneumothorax  Pre-operative Diagnosis: L pneumothorax (patient pulled out previous chest tube)  Post-operative Diagnosis: L pneumothorax  Procedure Details  Informed consent was obtained for the procedure, including sedation.  Risks of lung perforation, hemorrhage, arrhythmia, and adverse drug reaction were discussed.   The patient was put on the monitor by RR RN.After sterile skin prep, using standard technique including local, a 20 French tube was placed in the left anterior axillary line using the previous site.The tube was hooked up to a pleurevac and sutured in place. Good air returned. A sterile dressing was applied.  Findings: Air and a little fluid  Estimated Blood Loss:  Minimal         Specimens:  None              Complications:  None; patient tolerated the procedure well.         Disposition: transfer to 4NP         Condition: stable  Georganna Skeans, MD, MPH, FACS Please use [email or web address] to contact on call provider

## 2019-06-14 NOTE — Progress Notes (Signed)
RT called to patient room due to patient pulling trach out.  Upon arrival patient's sats were 100% on room air.  Placed another #4 cuffless shiley trach back in.  Positive color change noted.  Sats and vitals are still stable.  Will continue to monitor.

## 2019-06-14 NOTE — Discharge Instructions (Signed)

## 2019-06-14 NOTE — Progress Notes (Signed)
Patient presenting with extreme agitation. Pull out trach and attempted to pull out coretrak. This nurse applies mitts, gave haldol, and ativan to no improvement. Patient continues attempting to pull at lines, tubes and keeps trying to swing his legs out of the bed stating he is going home. Paged Dr on call. Currently trying to keep staff in room with patient for safety
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
## 2019-06-14 NOTE — Consult Note (Signed)
[address]       [place] [address]             [phone]                    Luke Choi [facility] Medical Record #[id number] Date of Birth: [birth year]  Referring: No ref. provider found Primary Care: Doreene Nest, NP Primary Cardiologist: No primary care provider on file.  Chief Complaint:   No chief complaint on file.   History of Present Illness:    Luke Choi 48 y.o. male known to our service for recurrent pneumothorax.  He had a prolonged hospitalization after multisystem trauma and respiratory failure requiring tracheostomy.  The right-sided pneumothorax resolved on its own, and he was subsequently discharged but returned with 2 days with a new left-sided pneumothorax.  This was initially managed but tube thoracostomy that he had a prolonged air leak and unfortunately the tube was pulled back.  He now has enlarging left pneumothorax.  CTS has been consulted to assist    Past Medical History:  Diagnosis Date   Chickenpox    Depression    Frequent headaches     Past Surgical History:  Procedure Laterality Date   I&D EXTREMITY Left 04/20/2019   Procedure: IRRIGATION AND DEBRIDEMENT EXTREMITY AND WOUND VAC PLACEMENT;  Surgeon: Tarry Kos, MD;  Location: [facility] OR;  Service: Orthopedics;  Laterality: Left;   INCISION AND DRAINAGE Left 04/22/2019   Procedure: INCISION AND DRAINAGE Left lower leg wounds.;  Surgeon: Tarry Kos, MD;  Location: [facility] OR;  Service: Orthopedics;  Laterality: Left;   INGUINAL HERNIA REPAIR     KNEE SURGERY Left    6 times   LACERATION REPAIR Left 04/20/2019   Procedure: Repair Complex Lacerations;  Surgeon: Tarry Kos, MD;  Location: [facility] OR;  Service: Orthopedics;  Laterality: Left;   ORIF ANKLE FRACTURE Right 04/22/2019   Procedure: Open Reduction Internal Fixation (Orif) Medial Malleolus Fracture.;  Surgeon: Tarry Kos, MD;  Location: [facility] OR;  Service: Orthopedics;  Laterality: Right;    Family History    Problem Relation Age of Onset   Arthritis Mother    Hyperlipidemia Mother    Hypertension Mother    Heart disease Mother    Diabetes Mother    Alcohol abuse Father    Arthritis Father    Prostate cancer Father 11   Hyperlipidemia Father    Hypertension Father    Heart disease Father    Diabetes Father    Heart attack Father 56   Prostate cancer Paternal Uncle    Arthritis Maternal Grandmother    Arthritis Maternal Grandfather    Arthritis Paternal Grandmother    Arthritis Paternal Grandfather      Social History   Tobacco Use  Smoking Status Never Smoker  Smokeless Tobacco Never Used    Social History   Substance and Sexual Activity  Alcohol Use Yes     Allergies  Allergen Reactions   Bee Venom Anaphylaxis    Current Facility-Administered Medications  Medication Dose Route Frequency Provider Last Rate Last Dose   0.9 %  sodium chloride infusion  250 mL Intravenous PRN Almond Lint, MD       acetaminophen (TYLENOL) tablet 650 mg  650 mg Oral Q4H PRN Almond Lint, MD   650 mg at 06/13/19 1738   apixaban (ELIQUIS) tablet 5 mg  5 mg Per Tube BID Juliette Mangle, [facility]  5 mg at 06/14/19 0948   Chlorhexidine Gluconate Cloth 2 % PADS 6 each  6 each Topical Daily Almond LintByerly, Faera, MD   6 each at 06/14/19 0948   clonazepam (KLONOPIN) disintegrating tablet 0.5 mg  0.5 mg Oral BID Barnetta Chapelsborne, Kelly, PA-C   0.5 mg at 06/14/19 1002   feeding supplement (OSMOLITE 1.5 CAL) liquid 1,000 mL  1,000 mL Per Tube Q24H Chevis Prettyoth, Paul III, MD 75 mL/hr at 06/13/19 1747 1,000 mL at 06/13/19 1747   feeding supplement (PRO-STAT SUGAR FREE 64) liquid 60 mL  60 mL Per Tube BID Diamantina MonksLovick, Ayesha N, MD   60 mL at 06/14/19 0948   haloperidol lactate (HALDOL) injection 5-10 mg  5-10 mg Intravenous Q6H PRN Barnetta Chapelsborne, Kelly, PA-C   5 mg at 06/14/19 1002   HYDROmorphone (DILAUDID) injection 0.5 mg  0.5 mg Intravenous Q8H PRN Diamantina MonksLovick, Ayesha N, MD       lidocaine (LIDODERM) 5 % 1 patch   1 patch Transdermal Q24H Rayburn, Kelly A, PA-C   1 patch at 06/13/19 1641   LORazepam (ATIVAN) injection 1 mg  1 mg Intravenous Q6H PRN Barnetta Chapelsborne, Kelly, PA-C       MEDLINE mouth rinse  15 mL Mouth Rinse BID Almond LintByerly, Faera, MD   15 mL at 06/13/19 1006   metoprolol tartrate (LOPRESSOR) tablet 75 mg  75 mg Oral TID Almond LintByerly, Faera, MD   75 mg at 06/14/19 [id number]   nutrition supplement (JUVEN) (JUVEN) powder packet 1 packet  1 packet Per Tube BID BM Diamantina MonksLovick, Ayesha N, MD   1 packet at 06/14/19 0949   oxyCODONE (ROXICODONE) 5 MG/5ML solution 5-10 mg  5-10 mg Per Tube Q4H PRN Diamantina MonksLovick, Ayesha N, MD   10 mg at 06/14/19 0612   QUEtiapine (SEROQUEL) tablet 100 mg  100 mg Oral BID Barnetta Chapelsborne, Kelly, PA-C   100 mg at 06/14/19 1002   QUEtiapine (SEROQUEL) tablet 50 mg  50 mg Oral Once Diamantina MonksLovick, Ayesha N, MD       sodium chloride flush (NS) 0.9 % injection 3 mL  3 mL Intravenous Q12H Almond LintByerly, Faera, MD   3 mL at 06/14/19 0452   sodium chloride flush (NS) 0.9 % injection 3 mL  3 mL Intravenous PRN Almond LintByerly, Faera, MD   3 mL at 06/10/19 0954     PHYSICAL EXAMINATION: BP (!) 144/88 (BP Location: Left Arm)    Pulse (!) 107    Temp (!) 97.4 F (36.3 C) (Oral)    Resp (!) 22    SpO2 97%   Physical Exam  Constitutional: No distress.  disheveled   HENT:  Head: Normocephalic.  Mouth/Throat: No oropharyngeal exudate.  Eyes: Conjunctivae and EOM are normal. No scleral icterus.  Neck:  Tracheostomy in place  Cardiovascular: Normal rate.  Pulmonary/Chest: Effort normal. No respiratory distress.  Abdominal: He exhibits no distension.  Neurological: He is alert.  Skin: Skin is warm and dry. He is not diaphoretic.     Diagnostic Studies & Laboratory data:     Recent Radiology Findings:   Dg Chest 2 View  Result Date: 06/10/2019 CLINICAL DATA:  Diminished breath sounds. EXAM: CHEST - 2 VIEW COMPARISON:  Chest radiograph 06/07/2019 FINDINGS: There is a large left pneumothorax with complete collapse of the left lung.  There is mild rightward shift of the mediastinum. There is hazy opacity throughout the right lung. Tracheostomy tube tip is at the level of the clavicular heads. IMPRESSION: 1. Large left pneumothorax with complete collapse of the left lung  and mild right mediastinal shift. 2. Hazy opacity throughout the right lung. Critical Value/emergent results were called by telephone at the time of interpretation on 06/10/2019 at 2:55 am to providerPAMELA LOVE , who verbally acknowledged these results. Electronically Signed   By: Deatra Robinson M.D.   On: 06/10/2019 02:55   Ct Chest Wo Contrast  Result Date: 06/03/2019 CLINICAL DATA:  Follow-up pneumothorax. EXAM: CT CHEST WITHOUT CONTRAST TECHNIQUE: Multidetector CT imaging of the chest was performed following the standard protocol without IV contrast. COMPARISON:  CT chest from 04/20/2019 FINDINGS: Cardiovascular: No significant vascular findings. Normal heart size. No pericardial effusion. Mediastinum/Nodes: No enlarged mediastinal or axillary lymph nodes. Thyroid gland, trachea, and esophagus demonstrate no significant findings. There is a tracheostomy tube in place with tip above the carina. A feeding tube is also in place with tip in the distal stomach. Lungs/Pleura: There is no pleural effusion identified. New moderate right sided pneumothorax is identified overlying the anterior and lateral right lung. Bilateral upper lobe ground-glass attenuation, airspace consolidation and scarring is identified. Multiple new subpleural cystic structures are identified overlying the anterior and medial left upper lobe compatible with posttraumatic pneumatoceles. Extensive scarring and volume loss within the lingula is identified. Upper Abdomen: No acute abnormality. Musculoskeletal: Healing comminuted fracture deformity of the right scapula. There are also healing right lateral rib deformities. Surrounding the distal right clavicle is a area of amorphous hyperdense mass  surrounding the distal right clavicle. Callus formation associated with a nondisplaced distal clavicle fracture. IMPRESSION: 1. Interval development of moderate right-sided pneumothorax. 2. Bilateral upper lobe ground-glass attenuation, airspace consolidation and scarring compatible with multifocal pneumonia. 3. Multiple new subpleural cystic structures are identified overlying the anterior and medial left upper lobe compatible with posttraumatic pneumatoceles. 4. Healing comminuted fracture deformity of the right scapula. 5. Healing right lateral rib deformities. Aortic Atherosclerosis (ICD10-I70.0). Critical Value/emergent results were called by telephone at the time of interpretation on 06/03/2019 at 11:43 am to providerKELLY [facility] , who verbally acknowledged these results. Electronically Signed   By: Signa Kell M.D.   On: 06/03/2019 11:43   Dg Chest Port 1 View  Result Date: 06/13/2019 CLINICAL DATA:  Respiratory failure. EXAM: PORTABLE CHEST 1 VIEW COMPARISON:  06/13/2019 at 5:09 a.m. FINDINGS: Tracheostomy tube unchanged. Enteric tube courses into the stomach and off the film as tip is not visualized. Left-sided chest tube has been pulled back slightly compared to the previous exam. Small to moderate left-sided pneumothorax with interval worsening. It measures 3.1 cm from apex to pleural line (previously 1.3 cm). Patchy hazy airspace opacification bilaterally without significant change. No effusion. Cardiomediastinal silhouette and remainder of the exam is unchanged. IMPRESSION: 1. Interval worsening of small to moderate left-sided pneumothorax as described. Left-sided chest tube in place which has been pulled back slightly compared to the previous exam. 2.  Stable bilateral hazy patchy airspace process likely infection. 3.  Remaining tubes and lines unchanged. These results will be called to the ordering clinician or representative by the Radiologist Assistant, and communication documented in the  PACS or zVision Dashboard. Electronically Signed   By: Elberta Fortis M.D.   On: 06/13/2019 08:10   Dg Chest Port 1 View  Result Date: 06/13/2019 CLINICAL DATA:  Left-sided pneumothorax EXAM: PORTABLE CHEST 1 VIEW COMPARISON:  Number first 2020 FINDINGS: Heart size is stable from prior study. The tracheostomy tube terminates above the carina. The enteric tube extends below the left hemidiaphragm. There is a left-sided chest tube in place. There is a  trace left apical pneumothorax. There is subcutaneous gas along the patient's left flank. Hazy bilateral airspace opacities are again noted. There is a displaced right clavicle fracture. There are multiple right-sided rib fractures. IMPRESSION: 1. Lines and tubes as above. 2. Trace left apical pneumothorax. 3. Persistent hazy bilateral airspace opacities. Electronically Signed   By: Katherine Mantle M.D.   On: 06/13/2019 05:37   Dg Chest Port 1 View  Result Date: 06/12/2019 CLINICAL DATA:  Evaluate for pneumothorax on the left. EXAM: PORTABLE CHEST 1 VIEW COMPARISON:  June 11, 2019 FINDINGS: The left apical pneumothorax remains, little smaller in the interval, measuring 6 mm at the apex. A small amount of air in the left lateral chest wall soft tissues is identified. A left-sided chest tube has been pulled back somewhat in the interval there appears to be in adequate position. No right-sided pneumothorax. The cardiomediastinal silhouette is stable. A feeding tube terminates below today's film. A tracheostomy tube remains in good position. IMPRESSION: 1. The left chest tube has been partially pulled back but appears to be in adequate position. A tiny left apical pneumothorax is a little smaller in the interval. 2. No other interval changes. Electronically Signed   By: Gerome Sam III M.D   On: 06/12/2019 15:20   Dg Chest Port 1 View  Result Date: 06/11/2019 CLINICAL DATA:  Follow-up left chest tube and left pneumothorax. EXAM: PORTABLE CHEST 1 VIEW  COMPARISON:  Yesterday. FINDINGS: Mild increase in size of the previously demonstrated left apical pneumothorax, currently occupying approximately 10% of the volume of the left hemithorax with an inferior extension and mild increase in adjacent soft tissue air inferiorly. A left chest tube remains in place. Diffuse peribronchial thickening and accentuation of the interstitial markings has not changed significantly. Mild bilateral patchy airspace opacities, involving a larger portion of both lungs with areas of more focal opacity previously demonstrated on both sides improved. A right scapular fracture is again noted. Satisfactory position of the tracheostomy tube. Feeding tube extending into the stomach. IMPRESSION: 1. Mild increase in size of the previously demonstrated left apical pneumothorax, currently occupying approximately 10% of the volume of the left hemithorax. 2. Mild probable bilateral pneumonia, improved cm areas and worse than others. 3. Stable changes of COPD and chronic bronchitis. 4. Stable right scapular fracture. These results will be called to the ordering clinician or representative by the Radiologist Assistant, and communication documented in the PACS or zVision Dashboard. Electronically Signed   By: Beckie Salts M.D.   On: 06/11/2019 07:40   Dg Chest Port 1 View  Result Date: 06/10/2019 CLINICAL DATA:  New chest tube EXAM: PORTABLE CHEST 1 VIEW COMPARISON:  Radiograph 06/10/2019 FINDINGS: Tracheostomy tube terminates in the mid trachea. Transesophageal tube terminates distal to the GE junction. Interval replacement of the left-sided pigtail catheter with a large 4 left pleural drain. Tube terminates medially in the left mid lung directed towards the apex. Small residual apical pneumothorax is present (see annotated image). There is residual airspace disease in both upper lungs and in the left mid lung compatible with known multifocal pneumonia. Some increased in past the could be  attributed to re-expansion edematous change. Degenerative changes are present in the imaged spine and shoulders. Mild subcutaneous emphysema of the left chest wall. IMPRESSION: Replacement of the left pigtail pleural drain with a larger bore pleural catheter. Mild subcutaneous emphysema of the left chest wall. Re-expansion of the left lung with small residual left apical pneumothorax. Persistent bilateral airspace opacities compatible  multifocal pneumonia. Some increased attenuation left upper lung could reflect some mild re-expansion edema Electronically Signed   By: Kreg Shropshire M.D.   On: 06/10/2019 16:33   Dg Chest Port 1 View  Result Date: 06/10/2019 CLINICAL DATA:  Pneumothorax, increased wall suction EXAM: PORTABLE CHEST 1 VIEW COMPARISON:  06/10/2019, 3:07 p.m. FINDINGS: No significant interval change in large, approximately 70% left pneumothorax with left-sided pigtail chest tube in position. The right lung is unchanged in appearance with dense heterogeneous interstitial opacity. Tracheostomy. Partially imaged enteric feeding tube. The heart and mediastinum are normal. IMPRESSION: 1. No significant interval change in large, approximately 70% left pneumothorax with left-sided pigtail chest tube in position. 2. The right lung is unchanged in appearance with dense heterogeneous interstitial opacity. 3.  Tracheostomy. Electronically Signed   By: Lauralyn Primes M.D.   On: 06/10/2019 15:34   Dg Chest Port 1 View  Result Date: 06/10/2019 CLINICAL DATA:  Chest tube. EXAM: PORTABLE CHEST 1 VIEW COMPARISON:  Prior chest x-ray same day. FINDINGS: Tracheostomy tube and feeding tube in stable position. Left chest tube in stable position. Severe progression of left-sided pneumothorax with near complete collapse of left lung. Tension may be present. By history the patient's nurse is aware of the above findings. Diffuse bilateral pulmonary infiltrates. Heart size normal. IMPRESSION: 1.  Lines and tubes including  left chest tube in stable position. 2. Severe progression of left-sided pneumothorax with near complete collapse of left lung. Component of tension may be present. By history patient's caregiver is aware of these findings. 3.  Diffuse bilateral pulmonary infiltrates. Critical Value/emergent results were called by telephone at the time of interpretation on 06/10/2019 at 3:20 pm to nurse Alcario Drought , who verbally acknowledged these results. Electronically Signed   By: Maisie Fus  Register   On: 06/10/2019 15:22   Dg Chest Port 1 View  Result Date: 06/10/2019 CLINICAL DATA:  Chest tube placement EXAM: PORTABLE CHEST 1 VIEW COMPARISON:  Earlier today FINDINGS: Significant re-expansion after left chest tube placement. Residual pneumothorax is small at the apex. Bilateral reticular airspace disease. Tracheostomy tube in place. Feeding tube in stable position. Normal heart size. Right scapular fracture. IMPRESSION: 1. Chest tube with significant evacuation of left pneumothorax. There is small residual left pneumothorax at the apex. 2. Resolved mediastinal shift and improved right lung aeration. Electronically Signed   By: Marnee Spring M.D.   On: 06/10/2019 04:41   Dg Chest Port 1 View  Result Date: 06/07/2019 CLINICAL DATA:  Pneumothorax. EXAM: PORTABLE CHEST 1 VIEW COMPARISON:  June 06, 2019. FINDINGS: The heart size and mediastinal contours are within normal limits. Tracheostomy and feeding tubes are unchanged in position. No pneumothorax or pleural effusion is noted. Both lungs are clear. The visualized skeletal structures are unremarkable. IMPRESSION: Stable support apparatus. No acute cardiopulmonary abnormality seen. Electronically Signed   By: Lupita Raider M.D.   On: 06/07/2019 07:27   Dg Chest Port 1 View  Result Date: 06/06/2019 CLINICAL DATA:  Chest tube assessment. EXAM: PORTABLE CHEST 1 VIEW COMPARISON:  06/05/2019 and 06/03/2019 FINDINGS: Right-sided chest tube unchanged. Tracheostomy tube  unchanged. Enteric tube courses into the stomach and off the film as tip is not visualized. Lungs are adequately inflated with persistent opacification over the lung apices unchanged mild stable interstitial changes over the lung bases. No new airspace process or effusion. No definite right apical pneumothorax visualized. Cardiomediastinal silhouette and remainder of the exam is unchanged. IMPRESSION: Stable biapical/upper lobe opacification and stable interstitial changes over  the lung bases. Tubes and lines as described. No right-sided pneumothorax visualized. Electronically Signed   By: Elberta Fortis M.D.   On: 06/06/2019 07:23   Dg Chest Port 1 View  Result Date: 06/05/2019 CLINICAL DATA:  Pneumothorax on the right EXAM: PORTABLE CHEST 1 VIEW COMPARISON:  Chest radiograph dated 06/04/2019 FINDINGS: A tracheostomy tube terminates in the midthoracic trachea. An enteric tube enters the stomach and terminates below the field of view. A right-sided thoracostomy tube is unchanged in position with tip overlying the right mid lung laterally. The cardiac silhouette is unchanged. Mild bilateral upper lung airspace opacities are unchanged. Mild bibasilar interstitial and airspace opacities are also unchanged. There is no significant pleural effusion or pneumothorax. A scapular fracture is redemonstrated. Known right-sided rib fractures are better seen on prior CT. IMPRESSION: 1. No significant interval change in the appearance of the chest. No significant pneumothorax with a right-sided thoracostomy tube in unchanged position. Electronically Signed   By: Romona Curls M.D.   On: 06/05/2019 15:33   Dg Chest Port 1 View  Result Date: 06/04/2019 CLINICAL DATA:  Trauma.  Pneumothorax. EXAM: PORTABLE CHEST 1 VIEW COMPARISON:  June 03, 2019 chest radiograph and chest CT. FINDINGS: Tracheostomy catheter tip is 6.0 cm above the carina. Feeding tube tip is below the diaphragm. There remains chest tube on the right,  unchanged in position. Subcutaneous air is noted on the right laterally. A rather minimal pneumothorax on the right is stable. No tension component. There is patchy atelectasis in the upper lobes with mild contusion in the right upper lobe. There is new patchy airspace opacity in the left mid and lower lung zones. Heart size and pulmonary vascularity are normal. No adenopathy. Comminuted fracture of the scapula inferior to the glenoid noted with displaced fracture fragments. Known right-sided rib fractures are better seen on CT. There is an apparent fracture of the lateral right eighth rib. IMPRESSION: 1. New airspace opacity in portions of the left mid lower lung regions. Suspect pneumonia, although aspiration could present similarly. Mild consolidation in the right upper lobe as well as upper lobe atelectasis bilaterally remains stable. 2. Chest tube remains in place on the right with minimal right apical pneumothorax. No tension component. Tracheostomy and feeding tube present as noted. 3.  Subcutaneous air laterally on the right. 4. Comminuted right scapular fracture noted. Nondisplaced fracture eighth rib on the right laterally noted. Other rib fractures better seen on recent CT. 5.  Stable cardiac silhouette. Electronically Signed   By: Bretta Bang III M.D.   On: 06/04/2019 12:29   Dg Chest Port 1 View  Result Date: 06/03/2019 CLINICAL DATA:  Chest tube placement. EXAM: PORTABLE CHEST 1 VIEW COMPARISON:  CT 06/03/2019.  Chest x-ray 06/03/2019. FINDINGS: Interim placement right chest tube. Interval near complete resolution of right pneumothorax with tiny right apical residual. Tracheostomy tube and feeding tube in stable position. Patchy bilateral pulmonary infiltrates again noted. Left costophrenic angle incompletely imaged. Right rib fractures and right scapular fracture best demonstrated by prior CT. Mild right chest wall subcutaneous emphysema. IMPRESSION: 1. Interim placement of right chest  tube. Interim near complete resolution of right pneumothorax with tiny right apical residual. Tracheostomy tube feeding tube in stable position. 2.  Patchy bilateral pulmonary infiltrates again noted. 3. Right rib fractures and right scapular fractures best demonstrated by prior CT. Mild right chest wall subcutaneous emphysema is noted. Electronically Signed   By: Maisie Fus  Register   On: 06/03/2019 16:17   Dg Chest [facility]  1 View  Result Date: 06/03/2019 CLINICAL DATA:  Leukocytosis.  Wheezing. EXAM: PORTABLE CHEST 1 VIEW COMPARISON:  Chest x-ray dated May 30, 2019. FINDINGS: Unchanged tracheostomy and feeding tubes. The heart size and mediastinal contours are within normal limits. New moderate right pneumothorax. No mediastinal shift. Diffuse bilateral airspace disease is unchanged. No pleural effusion. No acute osseous abnormality. IMPRESSION: 1. New moderate right pneumothorax. 2. Unchanged diffuse bilateral airspace disease. Critical Value/emergent results were called by telephone at the time of interpretation on 06/03/2019 at 9:23 am to providerKELLY [facility] , who verbally acknowledged these results. Electronically Signed   By: Obie Dredge M.D.   On: 06/03/2019 09:24   Dg Chest Port 1 View  Result Date: 05/30/2019 CLINICAL DATA:  Tracheostomy dependent. EXAM: PORTABLE CHEST 1 VIEW COMPARISON:  Radiograph 05/27/2019, most recent CT 04/20/2019 FINDINGS: Tracheostomy tube tip at the thoracic inlet. Enteric tube remains in place. Bilateral multifocal lung opacities, slight increase in the left suprahilar region. Unchanged heart size and mediastinal contours. No large pleural effusion. No visualized pneumothorax. IMPRESSION: 1. Tracheostomy tube tip at the thoracic inlet. 2. Bilateral multifocal lung opacities, slightly progression in the left suprahilar region. Electronically Signed   By: Narda Rutherford M.D.   On: 05/30/2019 06:03   Dg Chest Port 1 View  Result Date: 05/27/2019 CLINICAL DATA:   Trauma EXAM: PORTABLE CHEST 1 VIEW COMPARISON:  05/25/2019 FINDINGS: Tracheostomy, right PICC line and feeding tube remain in place, unchanged. Patchy airspace disease throughout the left lung and in the right upper lobe again noted with slight improvement on the left since prior study. Heart is normal size. No pneumothorax or visible effusions. IMPRESSION: Patchy bilateral airspace opacities with slight improvement on the left since prior study. Electronically Signed   By: Charlett Nose M.D.   On: 05/27/2019 08:19   Dg Chest Port 1 View  Result Date: 05/25/2019 CLINICAL DATA:  Trauma. EXAM: PORTABLE CHEST 1 VIEW COMPARISON:  May 24, 2019.

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
## 2019-06-15 ENCOUNTER — Inpatient Hospital Stay (HOSPITAL_COMMUNITY): Payer: BC Managed Care – PPO

## 2019-06-15 LAB — GLUCOSE, CAPILLARY: Glucose-Capillary: 90 mg/dL (ref 70–99)

## 2019-06-15 NOTE — Progress Notes (Signed)
6 weeks from placement of Acell to LLE wound Subjective: [name] is alert today on evaluation, but somnolent. Eyes open to touch and sound. Resting in bed.  LLE dressed with kerlix, abd, xeroform dressing. No sign of infection noted. No drainage noted from LLE distal wound, slight exudate noted under scab of incisional wound.  Objective: Vital signs in last 24 hours: Temp:  [97.7 F (36.5 C)-98.5 F (36.9 C)] 98.5 F (36.9 C) (11/04 1235) Pulse Rate:  [90-106] 95 (11/04 1235) Resp:  [16-24] 18 (11/04 1235) BP: (97-126)/(70-86) 117/75 (11/04 1235) SpO2:  [94 %-100 %] 99 % (11/04 1235) Last BM Date: 06/12/19  Intake/Output from previous day: 11/03 0701 - 11/04 0700 In: 123 [P.O.:120; I.V.:3] Out: 1018 [Urine:900; Chest Tube:118] Intake/Output this shift: Total I/O In: 80 [NG/GT:80] Out: -   General appearance: alert, no distress and somnolent, resting in bed Head: Normocephalic, without obvious abnormality Eyes: eyes open to sound and touch Resp: unlabored, chest tube left chest wall Male genitalia: condom cath in place Extremities: without edema, bandage on right medial ankle, pulses in-tact, LLE wrapped with kerlix Pulses: 2+ and symmetric Neurologic: Grossly normal Incision/Wound: LLE distal wound with scab forming (2.5 x 0.5 cm, slightly elevated scab), no drainage noted from LLE distal wound, no surrounding erythema, no swelling noted. LLE incisional wound with scabbing noted, slight exudate under scab.  Lab Results:  CBC    Component Value Date/Time   WBC 10.2 06/14/2019 0352   RBC 4.35 06/14/2019 0352   HGB 12.5 (L) 06/14/2019 0352   HCT 39.3 06/14/2019 0352   PLT 348 06/14/2019 0352   MCV 90.3 06/14/2019 0352   MCH 28.7 06/14/2019 0352   MCHC 31.8 06/14/2019 0352   RDW 14.8 06/14/2019 0352   LYMPHSABS 1.3 06/10/2019 0503   MONOABS 1.2 (H) 06/10/2019 0503   EOSABS 0.3 06/10/2019 0503   BASOSABS 0.1 06/10/2019 0503  ; BMET Recent Labs    06/13/19 0556  06/14/19 0352  NA 135 135  K 4.0 4.1  CL 101 99  CO2 25 24  GLUCOSE 101* 103*  BUN 16 14  CREATININE 0.41* 0.50*  CALCIUM 8.9 9.1   PT/INR No results for input(s): LABPROT, INR in the last 72 hours. ABG No results for input(s): PHART, HCO3 in the last 72 hours.  Invalid input(s): PCO2, PO2  Studies/Results: Ct Chest W Contrast  Result Date: 06/14/2019 CLINICAL DATA:  Recurrent left pneumothorax. EXAM: CT CHEST WITH CONTRAST TECHNIQUE: Multidetector CT imaging of the chest was performed during intravenous contrast administration. CONTRAST:  60mL OMNIPAQUE IOHEXOL 300 MG/ML  SOLN COMPARISON:  06/03/2019. FINDINGS: Cardiovascular: Coronary artery calcification. Heart size normal. No pericardial effusion. Mediastinum/Nodes: Low internal jugular, mediastinal, hilar and axillary lymph nodes are not enlarged by CT size criteria. A feeding tube is seen in the esophagus, extending into the stomach. Lungs/Pleura: Moderate left pneumothorax, new from 06/03/2019, with a left chest tube terminating along the left major fissure. Small to moderate right pneumothorax, decreased from 06/03/2019, without a percutaneous drain in place. There are areas of pulmonary parenchymal consolidation, architectural distortion and ground-glass bilaterally, minimally improved from 06/03/2019. Rounded cystic areas along the visceral pleural in the anterior upper lobes are noted. Bilateral lower lobe predominant ill-defined ground-glass nodularity with dependent atelectasis. Small bilateral pleural fluid collections. Tracheostomy is in place. Upper Abdomen: Visualized portions of the liver, adrenal glands, kidneys, spleen pancreas, stomach and bowel are otherwise grossly unremarkable. No upper abdominal adenopathy. Musculoskeletal: Degenerative changes in the spine. Healing comminuted right  scapular fracture. Healing right rib fractures. There is callus formation inferior to the distal right clavicle, indicative of a healing  fracture. Subcutaneous emphysema along the left lateral chest wall. IMPRESSION: 1. Moderate left hydro pneumothorax, new from 06/03/2019, with left chest tube in place. Small to moderate right hydro pneumothorax, decreased on 06/03/2019, without percutaneous drain in place. Associated posttraumatic pneumatoceles or blebs/bullous lesions along the ventral aspects of both upper lobes. 2. Slight improvement in airspace consolidation in the upper lobes with associated bronchiectasis, architectural distortion and bilateral lower lobe ground-glass nodularity, findings which are indicative of ongoing pneumonia. 3. Healing right scapular, rib and clavicle fractures. Electronically Signed   By: Leanna BattlesMelinda  Blietz M.D.   On: 06/14/2019 13:20   Dg Chest Port 1 View  Result Date: 06/15/2019 CLINICAL DATA:  Pneumothorax EXAM: PORTABLE CHEST 1 VIEW COMPARISON:  June 14, 2019 FINDINGS: The tracheostomy tube terminates above the carina. The enteric tube extends below the left hemidiaphragm. There is a left-sided chest tube in place. There is a small right pneumothorax. There is a moderate-sized left-sided pneumothorax which has increased in size from prior study. Hazy ground-glass airspace opacities are noted bilaterally. There is mild interstitial edema. There is increasing subcutaneous gas along the left flank. IMPRESSION: 1. Moderate left-sided pneumothorax, increased in size from prior study despite placement of a chest tube. There is increasing subcutaneous gas along the left flank. 2. Small right-sided pneumothorax, relatively stable from prior study. 3. Persistent hazy airspace opacities bilaterally. Electronically Signed   By: Katherine Mantlehristopher  Green M.D.   On: 06/15/2019 06:15   Dg Chest Port 1 View  Result Date: 06/14/2019 CLINICAL DATA:  Chest tube placement. EXAM: PORTABLE CHEST 1 VIEW COMPARISON:  Chest x-ray from earlier same day. FINDINGS: Interval placement of a LEFT-sided chest tube. Slight decrease in size of  the LEFT-sided pneumothorax. Stable appearance of the RIGHT-sided pneumothorax, moderate in size. No pleural effusions seen. Heart size and mediastinal contours are stable. Tracheostomy tube remains appropriately positioned in the midline. Enteric tube passes below the diaphragm. IMPRESSION: 1. Slight decrease in size of the LEFT-sided pneumothorax status post LEFT-sided chest tube. 2. Stable RIGHT-sided pneumothorax, moderate in size. Electronically Signed   By: Bary RichardStan  Maynard M.D.   On: 06/14/2019 11:37   Dg Chest Port 1 View  Result Date: 06/14/2019 CLINICAL DATA:  Follow-up left pneumothorax, inverted and chest tube removal by the patient EXAM: PORTABLE CHEST 1 VIEW COMPARISON:  06/13/2019 FINDINGS: Left-sided chest tube has been inadvertently removed in the interval. Significant recurrent pneumothorax is noted. Additionally there is a pneumothorax on the right which was not appreciated on the prior study. Tracheostomy tube and feeding catheter are again noted. Chronic airspace opacities are again seen somewhat accentuated by the pneumothoraces. IMPRESSION: Recurrent enlarged left pneumothorax. New right-sided pneumothorax which was not seen on the prior exam. These results will be called to the ordering clinician or representative by the Radiologist Assistant, and communication documented in the PACS or zVision Dashboard. Electronically Signed   By: Alcide CleverMark  Lukens M.D.   On: 06/14/2019 10:39    Anti-infectives: Anti-infectives (From admission, onward)   None      Assessment/Plan: ~6 weeks from placement of Acell to LLE wound.  LLE distal wound healing well. Acell incorporated, patient developing scab. Suspect within 1-2 weeks he will complete epithelialization of LLE distal wound.  LLE distal wound with previous Acell placement: Recommend Vaseline to LLE distal wound with scabbing, followed by 4x4 gauze, kerlix daily.  LLE incisional wound: Recommend continue  with xeroform to incisional wound,  but defer final decision on incisional wound management to ortho team. Continue with kerlix wrap over entire LLE.  Pictures were obtained of the patient and placed in the chart with the patient's or guardian's permission.  Will follow up in 1 week for re-evaluation, call with questions or concerns.    LOS: 5 days    Leslee Home, PA-C 06/15/2019

## 2019-06-15 NOTE — Progress Notes (Signed)
Trach removed at this time per MD order. Pt tolerating well, no distress noted. Remains on RA. Site cleaned & dressing applied.

## 2019-06-15 NOTE — Progress Notes (Signed)
Inpatient Rehab Admissions:  Inpatient Rehab Consult received.  I spoke with pt's significant other, [name], over the phone for rehabilitation assessment and to discuss goals and expectations of an inpatient rehab admission.  She is hopeful pt will be able to stabilize and transition back to CIR. I will need to get re-auth from [facility] when pt is ready.  Will follow.   Signed: Shann Medal, PT, DPT Admissions Coordinator [phone] 06/15/19  10:34 AM
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
## 2019-06-15 NOTE — Progress Notes (Signed)
Order placed to remove trach. Luke Choi has not been capped. Called MD to make aware. Plan is to cap trach at this time and if pt tolerates will decannulate in afternoon per MD.

## 2019-06-16 ENCOUNTER — Encounter (HOSPITAL_COMMUNITY): Admission: AD | Disposition: A | Discharge status: Rehabilitation Facility | Payer: Self-pay | Source: Ambulatory Visit

## 2019-06-16 ENCOUNTER — Encounter (HOSPITAL_COMMUNITY): Payer: Self-pay | Admitting: Certified Registered Nurse Anesthetist

## 2019-06-16 ENCOUNTER — Inpatient Hospital Stay (HOSPITAL_COMMUNITY): Payer: BC Managed Care – PPO

## 2019-06-16 DIAGNOSIS — J9311 Primary spontaneous pneumothorax: Secondary | ICD-10-CM

## 2019-06-16 HISTORY — PX: CHEST TUBE INSERTION: SHX231

## 2019-06-16 HISTORY — PX: VIDEO ASSISTED THORACOSCOPY (VATS)/WEDGE RESECTION: SHX6174

## 2019-06-16 LAB — CBC
HCT: 38.3 % — ABNORMAL LOW (ref 39.0–52.0)
Hemoglobin: 12.2 g/dL — ABNORMAL LOW (ref 13.0–17.0)
MCH: 28.5 pg (ref 26.0–34.0)
MCHC: 31.9 g/dL (ref 30.0–36.0)
MCV: 89.5 fL (ref 80.0–100.0)
Platelets: 496 10*3/uL — ABNORMAL HIGH (ref 150–400)
RBC: 4.28 MIL/uL (ref 4.22–5.81)
RDW: 15.1 % (ref 11.5–15.5)
WBC: 12.8 10*3/uL — ABNORMAL HIGH (ref 4.0–10.5)
nRBC: 0 % (ref 0.0–0.2)

## 2019-06-16 LAB — BASIC METABOLIC PANEL
Anion gap: 12 (ref 5–15)
BUN: 18 mg/dL (ref 6–20)
CO2: 24 mmol/L (ref 22–32)
Calcium: 9.4 mg/dL (ref 8.9–10.3)
Chloride: 100 mmol/L (ref 98–111)
Creatinine, Ser: 0.52 mg/dL — ABNORMAL LOW (ref 0.61–1.24)
GFR calc Af Amer: >60 mL/min (ref >60)
GFR calc non Af Amer: >60 mL/min (ref >60)
Glucose, Bld: 104 mg/dL — ABNORMAL HIGH (ref 70–99)
Potassium: 3.9 mmol/L (ref 3.5–5.1)
Sodium: 136 mmol/L (ref 135–145)

## 2019-06-16 LAB — GLUCOSE, CAPILLARY
Glucose-Capillary: 114 mg/dL — ABNORMAL HIGH (ref 70–99)
Glucose-Capillary: 214 mg/dL — ABNORMAL HIGH (ref 70–99)

## 2019-06-16 SURGERY — VIDEO ASSISTED THORACOSCOPY (VATS)/WEDGE RESECTION
Anesthesia: General | Site: Chest | Laterality: Right

## 2019-06-16 MED ORDER — BUPIVACAINE HCL (PF) 0.5 % IJ SOLN
INTRAMUSCULAR | Status: AC
  Filled 2019-06-16: qty 30

## 2019-06-16 MED ORDER — OXYCODONE HCL 5 MG PO TABS: 5.0000 mg | ORAL_TABLET | Freq: Once | ORAL | Status: DC | PRN

## 2019-06-16 MED ORDER — ACETAMINOPHEN 160 MG/5ML PO SOLN
1000.0000 mg | Freq: Four times a day (QID) | ORAL | Status: AC
  Administered 2019-06-16 – 2019-06-21 (×9): 1000 mg via ORAL
  Filled 2019-06-16 (×13): qty 40.6

## 2019-06-16 MED ORDER — BUPIVACAINE LIPOSOME 1.3 % IJ SUSP
20.0000 mL | Freq: Once | INTRAMUSCULAR | Status: DC
  Filled 2019-06-16: qty 20

## 2019-06-16 MED ORDER — LACTATED RINGERS IV SOLN
INTRAVENOUS | Status: DC | PRN
  Administered 2019-06-16: 10:00:00 via INTRAVENOUS

## 2019-06-16 MED ORDER — CHLORHEXIDINE GLUCONATE CLOTH 2 % EX PADS
6.0000 | MEDICATED_PAD | Freq: Every day | CUTANEOUS | Status: DC
  Administered 2019-06-16 – 2019-06-28 (×12): 6 via TOPICAL

## 2019-06-16 MED ORDER — FENTANYL CITRATE (PF) 250 MCG/5ML IJ SOLN
INTRAMUSCULAR | Status: AC
  Filled 2019-06-16: qty 5

## 2019-06-16 MED ORDER — FENTANYL CITRATE (PF) 250 MCG/5ML IJ SOLN
INTRAMUSCULAR | Status: DC | PRN
  Administered 2019-06-16: 100 ug via INTRAVENOUS
  Administered 2019-06-16: 50 ug via INTRAVENOUS

## 2019-06-16 MED ORDER — FENTANYL CITRATE (PF) 100 MCG/2ML IJ SOLN: 25.0000 ug | INTRAMUSCULAR | Status: DC | PRN

## 2019-06-16 MED ORDER — QUETIAPINE FUMARATE 100 MG PO TABS
100.0000 mg | ORAL_TABLET | Freq: Two times a day (BID) | ORAL | Status: DC
  Administered 2019-06-17 – 2019-06-21 (×9): 100 mg via ORAL
  Filled 2019-06-16 (×8): qty 1
  Filled 2019-06-16: qty 2

## 2019-06-16 MED ORDER — FENTANYL CITRATE (PF) 100 MCG/2ML IJ SOLN
25.0000 ug | INTRAMUSCULAR | Status: DC | PRN
  Administered 2019-06-16 (×4): 25 ug via INTRAVENOUS

## 2019-06-16 MED ORDER — PROPOFOL 10 MG/ML IV BOLUS
INTRAVENOUS | Status: AC
  Filled 2019-06-16: qty 20

## 2019-06-16 MED ORDER — SUGAMMADEX SODIUM 200 MG/2ML IV SOLN
INTRAVENOUS | Status: DC | PRN
  Administered 2019-06-16: 200 mg via INTRAVENOUS

## 2019-06-16 MED ORDER — ROCURONIUM BROMIDE 10 MG/ML (PF) SYRINGE
PREFILLED_SYRINGE | INTRAVENOUS | Status: DC | PRN
  Administered 2019-06-16: 40 mg via INTRAVENOUS
  Administered 2019-06-16: 60 mg via INTRAVENOUS

## 2019-06-16 MED ORDER — ONDANSETRON HCL 4 MG/2ML IJ SOLN: 4.0000 mg | Freq: Once | INTRAMUSCULAR | Status: DC | PRN

## 2019-06-16 MED ORDER — PHENYLEPHRINE 40 MCG/ML (10ML) SYRINGE FOR IV PUSH (FOR BLOOD PRESSURE SUPPORT)
PREFILLED_SYRINGE | INTRAVENOUS | Status: DC | PRN
  Administered 2019-06-16 (×2): 80 ug via INTRAVENOUS

## 2019-06-16 MED ORDER — BUPIVACAINE HCL 0.5 % IJ SOLN
INTRAMUSCULAR | Status: DC | PRN
  Administered 2019-06-16: 10 mL

## 2019-06-16 MED ORDER — ONDANSETRON HCL 4 MG/2ML IJ SOLN
4.0000 mg | Freq: Four times a day (QID) | INTRAMUSCULAR | Status: DC | PRN
  Administered 2019-06-17 – 2019-06-24 (×4): 4 mg via INTRAVENOUS
  Filled 2019-06-16 (×5): qty 2

## 2019-06-16 MED ORDER — ONDANSETRON HCL 4 MG/2ML IJ SOLN
INTRAMUSCULAR | Status: DC | PRN
  Administered 2019-06-16: 4 mg via INTRAVENOUS

## 2019-06-16 MED ORDER — TRAMADOL HCL 50 MG PO TABS: 50.0000 mg | ORAL_TABLET | Freq: Four times a day (QID) | ORAL | Status: DC | PRN

## 2019-06-16 MED ORDER — MIDAZOLAM HCL 2 MG/2ML IJ SOLN
INTRAMUSCULAR | Status: DC | PRN
  Administered 2019-06-16: 1 mg via INTRAVENOUS

## 2019-06-16 MED ORDER — 0.9 % SODIUM CHLORIDE (POUR BTL) OPTIME
TOPICAL | Status: DC | PRN
  Administered 2019-06-16: 1000 mL

## 2019-06-16 MED ORDER — LACTATED RINGERS IV SOLN
INTRAVENOUS | Status: DC | PRN
  Administered 2019-06-16: 09:00:00 via INTRAVENOUS

## 2019-06-16 MED ORDER — SODIUM CHLORIDE (PF) 0.9 % IJ SOLN
INTRAMUSCULAR | Status: DC | PRN
  Administered 2019-06-16: 50 mL

## 2019-06-16 MED ORDER — BUPIVACAINE HCL (PF) 0.5 % IJ SOLN
INTRAMUSCULAR | Status: DC | PRN
  Administered 2019-06-16: 50 mL

## 2019-06-16 MED ORDER — FENTANYL CITRATE (PF) 250 MCG/5ML IJ SOLN
INTRAMUSCULAR | Status: AC
  Filled 2019-06-16: qty 25

## 2019-06-16 MED ORDER — MIDAZOLAM HCL (PF) 10 MG/2ML IJ SOLN
INTRAMUSCULAR | Status: AC
  Filled 2019-06-16: qty 2

## 2019-06-16 MED ORDER — CEFAZOLIN SODIUM-DEXTROSE 2-3 GM-%(50ML) IV SOLR
INTRAVENOUS | Status: DC | PRN
  Administered 2019-06-16: 2 g via INTRAVENOUS

## 2019-06-16 MED ORDER — EPHEDRINE SULFATE-NACL 50-0.9 MG/10ML-% IV SOSY
PREFILLED_SYRINGE | INTRAVENOUS | Status: DC | PRN
  Administered 2019-06-16: 10 mg via INTRAVENOUS

## 2019-06-16 MED ORDER — SODIUM CHLORIDE 0.9 % IV SOLN: INTRAVENOUS | Status: DC | PRN

## 2019-06-16 MED ORDER — OXYCODONE HCL 5 MG/5ML PO SOLN: 5.0000 mg | Freq: Once | ORAL | Status: DC | PRN

## 2019-06-16 MED ORDER — BUPIVACAINE LIPOSOME 1.3 % IJ SUSP
INTRAMUSCULAR | Status: DC | PRN
  Administered 2019-06-16: 20 mL

## 2019-06-16 MED ORDER — BISACODYL 5 MG PO TBEC
10.0000 mg | DELAYED_RELEASE_TABLET | Freq: Every day | ORAL | Status: DC
  Filled 2019-06-16 (×2): qty 2

## 2019-06-16 MED ORDER — ACETAMINOPHEN 500 MG PO TABS
1000.0000 mg | ORAL_TABLET | Freq: Four times a day (QID) | ORAL | Status: AC
  Administered 2019-06-17 – 2019-06-19 (×7): 1000 mg via ORAL
  Filled 2019-06-16 (×9): qty 2

## 2019-06-16 MED ORDER — PHENYLEPHRINE HCL-NACL 10-0.9 MG/250ML-% IV SOLN
INTRAVENOUS | Status: DC | PRN
  Administered 2019-06-16: 35 ug/min via INTRAVENOUS

## 2019-06-16 MED ORDER — KETOROLAC TROMETHAMINE 15 MG/ML IJ SOLN: 15.0000 mg | Freq: Four times a day (QID) | INTRAMUSCULAR | Status: AC | PRN

## 2019-06-16 MED ORDER — PROPOFOL 10 MG/ML IV BOLUS
INTRAVENOUS | Status: DC | PRN
  Administered 2019-06-16: 170 mg via INTRAVENOUS

## 2019-06-16 MED ORDER — OSMOLITE 1.5 CAL PO LIQD
1000.0000 mL | ORAL | Status: AC
  Administered 2019-06-16: 1000 mL
  Filled 2019-06-16: qty 1000

## 2019-06-16 MED ORDER — SENNOSIDES-DOCUSATE SODIUM 8.6-50 MG PO TABS
1.0000 | ORAL_TABLET | Freq: Every day | ORAL | Status: DC
  Administered 2019-06-16 – 2019-07-04 (×19): 1 via ORAL
  Filled 2019-06-16 (×19): qty 1

## 2019-06-16 MED ORDER — MIDAZOLAM HCL 2 MG/2ML IJ SOLN
INTRAMUSCULAR | Status: AC
  Filled 2019-06-16: qty 2

## 2019-06-16 MED ORDER — FENTANYL CITRATE (PF) 100 MCG/2ML IJ SOLN
INTRAMUSCULAR | Status: AC
  Filled 2019-06-16: qty 2

## 2019-06-16 MED ORDER — PHENYLEPHRINE 40 MCG/ML (10ML) SYRINGE FOR IV PUSH (FOR BLOOD PRESSURE SUPPORT)
PREFILLED_SYRINGE | INTRAVENOUS | Status: AC
  Filled 2019-06-16: qty 10

## 2019-06-16 MED ORDER — LIDOCAINE 2% (20 MG/ML) 5 ML SYRINGE
INTRAMUSCULAR | Status: DC | PRN
  Administered 2019-06-16: 60 mg via INTRAVENOUS

## 2019-06-16 MED ORDER — INSULIN ASPART 100 UNIT/ML ~~LOC~~ SOLN
0.0000 [IU] | SUBCUTANEOUS | Status: DC
  Administered 2019-06-16: 8 [IU] via SUBCUTANEOUS
  Administered 2019-06-17 – 2019-06-21 (×5): 2 [IU] via SUBCUTANEOUS
  Administered 2019-06-21: 4 [IU] via SUBCUTANEOUS
  Administered 2019-06-22 (×3): 2 [IU] via SUBCUTANEOUS
  Administered 2019-06-23: 4 [IU] via SUBCUTANEOUS
  Administered 2019-06-23 – 2019-06-25 (×4): 2 [IU] via SUBCUTANEOUS
  Administered 2019-06-26: 4 [IU] via SUBCUTANEOUS

## 2019-06-16 MED ORDER — DEXAMETHASONE SODIUM PHOSPHATE 10 MG/ML IJ SOLN
INTRAMUSCULAR | Status: DC | PRN
  Administered 2019-06-16: 4 mg via INTRAVENOUS

## 2019-06-16 SURGICAL SUPPLY — 99 items
APPLIER CLIP ROT 10 11.4 M/L (STAPLE)
BLADE CLIPPER SURG (BLADE) ×4 IMPLANT
CANISTER SUCT 3000ML PPV (MISCELLANEOUS) ×4 IMPLANT
CATH ROBINSON RED A/P 14FR (CATHETERS) IMPLANT
CATH THORACIC 28FR (CATHETERS) ×2 IMPLANT
CATH THORACIC 28FR RT ANG (CATHETERS) IMPLANT
CATH THORACIC 36FR (CATHETERS) IMPLANT
CATH THORACIC 36FR RT ANG (CATHETERS) IMPLANT
CATH TROCAR 20FR (CATHETERS) IMPLANT
CLEANER TIP ELECTROSURG 2X2 (MISCELLANEOUS) ×2 IMPLANT
CLIP APPLIE ROT 10 11.4 M/L (STAPLE) IMPLANT
CLIP VESOCCLUDE MED 6/CT (CLIP) ×2 IMPLANT
CONN ST 1/4X3/8  BEN (MISCELLANEOUS)
CONN ST 1/4X3/8 BEN (MISCELLANEOUS) IMPLANT
CONN Y 3/8X3/8X3/8  BEN (MISCELLANEOUS)
CONN Y 3/8X3/8X3/8 BEN (MISCELLANEOUS) IMPLANT
CONT SPEC 4OZ CLIKSEAL STRL BL (MISCELLANEOUS) ×12 IMPLANT
COVER SURGICAL LIGHT HANDLE (MISCELLANEOUS) IMPLANT
COVER WAND RF STERILE (DRAPES) ×2 IMPLANT
DEFOGGER SCOPE WARMER CLEARIFY (MISCELLANEOUS) IMPLANT
DERMABOND ADVANCED (GAUZE/BANDAGES/DRESSINGS) ×2
DERMABOND ADVANCED .7 DNX12 (GAUZE/BANDAGES/DRESSINGS) IMPLANT
DISSECTOR BLUNT TIP ENDO 5MM (MISCELLANEOUS) IMPLANT
DRAIN CHANNEL 28F RND 3/8 FF (WOUND CARE) IMPLANT
DRAIN CHANNEL 32F RND 10.7 FF (WOUND CARE) IMPLANT
DRAPE WARM FLUID 44X44 (DRAPES) ×2 IMPLANT
ELECT BLADE 6.5 EXT (BLADE) ×4 IMPLANT
ELECT REM PT RETURN 9FT ADLT (ELECTROSURGICAL) ×4
ELECTRODE REM PT RTRN 9FT ADLT (ELECTROSURGICAL) ×2 IMPLANT
GAUZE SPONGE 4X4 12PLY STRL (GAUZE/BANDAGES/DRESSINGS) ×4 IMPLANT
GAUZE SPONGE 4X4 12PLY STRL LF (GAUZE/BANDAGES/DRESSINGS) ×4 IMPLANT
GLOVE BIO SURGEON STRL SZ7 (GLOVE) ×8 IMPLANT
GOWN STRL REUS W/ TWL LRG LVL3 (GOWN DISPOSABLE) ×4 IMPLANT
GOWN STRL REUS W/ TWL XL LVL3 (GOWN DISPOSABLE) ×2 IMPLANT
GOWN STRL REUS W/TWL LRG LVL3 (GOWN DISPOSABLE) ×4
GOWN STRL REUS W/TWL XL LVL3 (GOWN DISPOSABLE) ×2
HANDLE STAPLE ENDO GIA SHORT (STAPLE) ×2
HEMOSTAT SURGICEL 2X14 (HEMOSTASIS) IMPLANT
KIT BASIN OR (CUSTOM PROCEDURE TRAY) ×4 IMPLANT
KIT SUCTION CATH 14FR (SUCTIONS) IMPLANT
KIT TURNOVER KIT B (KITS) ×4 IMPLANT
NDL SPNL 18GX3.5 QUINCKE PK (NEEDLE) IMPLANT
NEEDLE 22X1 1/2 (OR ONLY) (NEEDLE) ×2 IMPLANT
NEEDLE SPNL 18GX3.5 QUINCKE PK (NEEDLE) IMPLANT
NS IRRIG 1000ML POUR BTL (IV SOLUTION) ×10 IMPLANT
PACK CHEST (CUSTOM PROCEDURE TRAY) ×4 IMPLANT
PACK UNIVERSAL I (CUSTOM PROCEDURE TRAY) ×4 IMPLANT
PAD ARMBOARD 7.5X6 YLW CONV (MISCELLANEOUS) ×4 IMPLANT
POUCH ENDO CATCH II 15MM (MISCELLANEOUS) IMPLANT
POUCH SPECIMEN RETRIEVAL 10MM (ENDOMECHANICALS) IMPLANT
RELOAD STAPLE 45 PURP MED/THCK (STAPLE) IMPLANT
RELOAD TRI 45 ART MED THCK PUR (STAPLE) ×20 IMPLANT
RELOAD TRI 60 ART MED THCK PUR (STAPLE) ×2 IMPLANT
SCISSORS LAP 5X35 DISP (ENDOMECHANICALS) IMPLANT
SEALANT PROGEL (MISCELLANEOUS) IMPLANT
SEALANT SURG COSEAL 4ML (VASCULAR PRODUCTS) IMPLANT
SEALANT SURG COSEAL 8ML (VASCULAR PRODUCTS) IMPLANT
SEALER LIGASURE MARYLAND 30 (ELECTROSURGICAL) ×2 IMPLANT
SET IRRIG TUBING LAPAROSCOPIC (IRRIGATION / IRRIGATOR) IMPLANT
SOL ANTI FOG 6CC (MISCELLANEOUS) ×2 IMPLANT
SOLUTION ANTI FOG 6CC (MISCELLANEOUS) ×2
SPECIMEN JAR MEDIUM (MISCELLANEOUS) IMPLANT
SPONGE INTESTINAL PEANUT (DISPOSABLE) ×4 IMPLANT
SPONGE TONSIL TAPE 1 RFD (DISPOSABLE) ×4 IMPLANT
STAPLER ENDO GIA 12 SHRT THIN (STAPLE) ×2 IMPLANT
STAPLER ENDO GIA 12MM SHORT (STAPLE) ×2 IMPLANT
STOPCOCK 4 WAY LG BORE MALE ST (IV SETS) ×4 IMPLANT
SUT MNCRL AB 3-0 PS2 18 (SUTURE) IMPLANT
SUT MON AB 2-0 CT1 36 (SUTURE) IMPLANT
SUT PDS AB 1 CTX 36 (SUTURE) IMPLANT
SUT PROLENE 4 0 RB 1 (SUTURE)
SUT PROLENE 4-0 RB1 .5 CRCL 36 (SUTURE) IMPLANT
SUT SILK  1 MH (SUTURE) ×2
SUT SILK 1 MH (SUTURE) IMPLANT
SUT SILK 1 TIES 10X30 (SUTURE) ×4 IMPLANT
SUT SILK 2 0 SH (SUTURE) IMPLANT
SUT SILK 2 0SH CR/8 30 (SUTURE) IMPLANT
SUT VIC AB 1 CTX 36 (SUTURE)
SUT VIC AB 1 CTX36XBRD ANBCTR (SUTURE) IMPLANT
SUT VIC AB 2-0 CT1 27 (SUTURE) ×2
SUT VIC AB 2-0 CT1 TAPERPNT 27 (SUTURE) ×4 IMPLANT
SUT VIC AB 3-0 SH 27 (SUTURE) ×2
SUT VIC AB 3-0 SH 27X BRD (SUTURE) ×2 IMPLANT
SUT VICRYL 0 UR6 27IN ABS (SUTURE) ×4 IMPLANT
SUT VICRYL 2 TP 1 (SUTURE) IMPLANT
SYR 10ML LL (SYRINGE) ×4 IMPLANT
SYR 30ML LL (SYRINGE) ×4 IMPLANT
SYR 50ML LL SCALE MARK (SYRINGE) ×4 IMPLANT
SYSTEM SAHARA CHEST DRAIN ATS (WOUND CARE) ×6 IMPLANT
TAPE CLOTH 4X10 WHT NS (GAUZE/BANDAGES/DRESSINGS) ×4 IMPLANT
TAPE CLOTH SURG 4X10 WHT LF (GAUZE/BANDAGES/DRESSINGS) ×4 IMPLANT
TIP APPLICATOR SPRAY EXTEND 16 (VASCULAR PRODUCTS) IMPLANT
TOWEL GREEN STERILE (TOWEL DISPOSABLE) ×4 IMPLANT
TOWEL GREEN STERILE FF (TOWEL DISPOSABLE) ×4 IMPLANT
TRAY CHEST TUBE INSERTION DISP (SET/KITS/TRAYS/PACK) ×2 IMPLANT
TRAY FOLEY MTR SLVR 16FR STAT (SET/KITS/TRAYS/PACK) ×4 IMPLANT
TROCAR XCEL BLADELESS 5X75MML (TROCAR) ×4 IMPLANT
TUBING EXTENTION W/L.L. (IV SETS) ×4 IMPLANT
WATER STERILE IRR 1000ML POUR (IV SOLUTION) ×6 IMPLANT

## 2019-06-16 NOTE — Anesthesia Procedure Notes (Signed)
Procedure Name: Intubation Date/Time: 06/16/2019 9:30 AM Performed by: Larene Beach, CRNA Pre-anesthesia Checklist: Patient identified, Emergency Drugs available, Suction available and Patient being monitored Patient Re-evaluated:Patient Re-evaluated prior to induction Oxygen Delivery Method: Circle system utilized Preoxygenation: Pre-oxygenation with 100% oxygen Induction Type: IV induction Ventilation: Mask ventilation without difficulty, Two handed mask ventilation required and Oral airway inserted - appropriate to patient size Laryngoscope Size: Glidescope and 3 Grade View: Grade I Tube type: Oral Endobronchial tube: Double lumen EBT, EBT position confirmed by auscultation and EBT position confirmed by fiberoptic bronchoscope Number of attempts: 1 Airway Equipment and Method: Stylet and Oral airway Placement Confirmation: ETT inserted through vocal cords under direct vision,  positive ETCO2 and breath sounds checked- equal and bilateral Tube secured with: Tape Dental Injury: Teeth and Oropharynx as per pre-operative assessment

## 2019-06-16 NOTE — Progress Notes (Addendum)
Pt arrived on floor, transferred from PACU. He is still confused and states "Take me out the car.. I want to go home.. Please help me put my clothes on, I promise I'll come back tomorrow". He is still trying to get OOB. He is only A&OX4 to self and in no acute distress. Presents with Right and Left chest tube secured, intact, draining, left IJ flushed and saline locked, x2 puncture sites open to air to L rib cage, blanchable redness to sacral area, dsg to R wrist minimally saturated post arterial line removal.   Bedside table and personal belongings within reach of patient. Patient educated on usage of nurse call bell, placed within reach of patient. Patient instructed to call for assistance. Patient in bed. Will continue to monitor.   Report received from [place].

## 2019-06-16 NOTE — Progress Notes (Signed)
PT Cancellation Note  Patient Details Name: Luke Choi MRN: [id number] DOB: [birth year]   Cancelled Treatment:    Reason Eval/Treat Not Completed: Patient at procedure or test/unavailable   [name] 06/16/2019, 8:18 AM Bayard Males, PT Acute Rehabilitation Services Pager: [phone] Office: [phone]

## 2019-06-16 NOTE — Progress Notes (Signed)
SLP Cancellation Note  Patient Details Name: Luke Choi MRN: [id number] DOB: [birth year]   Cancelled treatment:        Pt at procedure. Will plan to continue intervention tomorrow.                                                                                                 [place] Siren 06/16/2019, 11:46 AM   Orbie Pyo Colvin Caroli.Ed Risk analyst [phone] Office [phone]
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
## 2019-06-16 NOTE — Anesthesia Procedure Notes (Signed)
Central Venous Catheter Insertion Performed by: Roberts Gaudy, MD, anesthesiologist Start/End11/12/2018 11:10 AM, 06/16/2019 11:20 AM Patient location: OR. Preanesthetic checklist: patient identified, IV checked, site marked, risks and benefits discussed, surgical consent, monitors and equipment checked, pre-op evaluation, timeout performed and anesthesia consent Lidocaine 1% used for infiltration and patient sedated Hand hygiene performed  and maximum sterile barriers used  Catheter size: 8 Fr Total catheter length 16. Central line was placed.Double lumen Procedure performed using ultrasound guided technique. Ultrasound Notes:image(s) printed for medical record Attempts: 1 Following insertion, dressing applied and line sutured. Post procedure assessment: blood return through all ports  Patient tolerated the procedure well with no immediate complications.

## 2019-06-16 NOTE — Progress Notes (Signed)
     [address]       [place], [address]             [phone]       No events  Vitals:   06/15/19 2316 06/16/19 0310  BP:    Pulse:    Resp:    Temp: 98 F (36.7 C) 98 F (36.7 C)  SpO2:     Alert NAD CT in place No respiratory distress  Bilateral recurrent pneumothoraces OR today for right tube thoracostomy, L VATS, wedge resection, and mechanical pleurodesis  Will return to OR on Monday for right VATS.

## 2019-06-16 NOTE — Anesthesia Postprocedure Evaluation (Signed)
Anesthesia Post Note  Patient: Luke Choi  Procedure(s) Performed: VIDEO ASSISTED THORACOSCOPY, wedge resection, mechanical pleurodesis (Left Chest) Chest Tube Insertion (Right Chest)     Patient location during evaluation: PACU Anesthesia Type: General Level of consciousness: lethargic, patient cooperative and confused Pain management: pain level controlled Vital Signs Assessment: post-procedure vital signs reviewed and stable Respiratory status: spontaneous breathing, nonlabored ventilation, respiratory function stable and patient connected to nasal cannula oxygen Cardiovascular status: blood pressure returned to baseline and stable Postop Assessment: no apparent nausea or vomiting Anesthetic complications: no    Last Vitals:  Vitals:   06/16/19 1215 06/16/19 1230  BP: 108/81 113/77  Pulse: 90 92  Resp: 17 18  Temp:    SpO2: 94% 99%    Last Pain:  Vitals:   06/16/19 1215  TempSrc:   PainSc: 0-No pain                 [name] COKER

## 2019-06-16 NOTE — Anesthesia Postprocedure Evaluation (Signed)
Anesthesia Post Note  Patient: Paeton Studer  Procedure(s) Performed: VIDEO ASSISTED THORACOSCOPY, wedge resection, mechanical pleurodesis (Left Chest) Chest Tube Insertion (Right Chest)     Patient location during evaluation: PACU Anesthesia Type: General Level of consciousness: awake and alert Pain management: pain level controlled Vital Signs Assessment: post-procedure vital signs reviewed and stable Respiratory status: spontaneous breathing, nonlabored ventilation, respiratory function stable and patient connected to nasal cannula oxygen Cardiovascular status: blood pressure returned to baseline and stable Postop Assessment: no apparent nausea or vomiting Anesthetic complications: no    Last Vitals:  Vitals:   06/16/19 1707 06/16/19 1800  BP: (!) 115/41 128/89  Pulse: (!) 102 (!) 101  Resp: (!) 28 20  Temp: 36.5 C   SpO2: 96% 92%    Last Pain:  Vitals:   06/16/19 1707  TempSrc: Oral  PainSc:                  Terricka Onofrio COKER
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
## 2019-06-16 NOTE — Anesthesia Procedure Notes (Signed)
Arterial Line Insertion Start/End11/12/2018 9:40 AM Performed by: Larene Beach, CRNA, CRNA  Patient location: OR. Patient sedated Right, radial was placed Catheter size: 20 G Hand hygiene performed  and maximum sterile barriers used  Allen's test indicative of satisfactory collateral circulation Attempts: 2 Procedure performed without using ultrasound guided technique. Following insertion, Biopatch and dressing applied. Post procedure assessment: normal  Patient tolerated the procedure well with no immediate complications.

## 2019-06-16 NOTE — Progress Notes (Signed)
Patient taken to pre-op for procedure. Report given to Pre-op RN.

## 2019-06-16 NOTE — Transfer of Care (Signed)
Immediate Anesthesia Transfer of Care Note  Patient: Luke Choi  Procedure(s) Performed: VIDEO ASSISTED THORACOSCOPY, wedge resection, mechanical pleurodesis (Left Chest) Chest Tube Insertion (Right Chest)  Patient Location: PACU  Anesthesia Type:General  Level of Consciousness: drowsy and patient cooperative  Airway & Oxygen Therapy: Patient Spontanous Breathing and Patient connected to face mask oxygen  Post-op Assessment: Report given to RN and Post -op Vital signs reviewed and stable  Post vital signs: stable  Last Vitals:  Vitals Value Taken Time  BP 114/76 06/16/19 1145  Temp    Pulse 87 06/16/19 1153  Resp 16 06/16/19 1153  SpO2 100 % 06/16/19 1153  Vitals shown include unvalidated device data.  Last Pain:  Vitals:   06/16/19 0400  TempSrc:   PainSc: Asleep         Complications: No apparent anesthesia complications

## 2019-06-16 NOTE — Anesthesia Preprocedure Evaluation (Signed)
Anesthesia Evaluation  Patient identified by MRN, date of birth, ID band Patient confused    Reviewed: Patient's Chart, lab work & pertinent test results, Unable to perform ROS - Chart review only  Airway      Mouth opening: Limited Mouth Opening  Dental  (+) Teeth Intact, Poor Dentition   Pulmonary    breath sounds clear to auscultation       Cardiovascular  Rhythm:Regular Rate:Normal     Neuro/Psych    GI/Hepatic   Endo/Other    Renal/GU      Musculoskeletal   Abdominal   Peds  Hematology   Anesthesia Other Findings   Reproductive/Obstetrics                             Anesthesia Physical Anesthesia Plan  ASA: III  Anesthesia Plan: General   Post-op Pain Management:    Induction: Intravenous  PONV Risk Score and Plan: Ondansetron and Dexamethasone  Airway Management Planned: Oral ETT  Additional Equipment: Arterial line and Ultrasound Guidance Line Placement  Intra-op Plan:   Post-operative Plan: Extubation in OR  Informed Consent: I have reviewed the patients History and Physical, chart, labs and discussed the procedure including the risks, benefits and alternatives for the proposed anesthesia with the patient or authorized representative who has indicated his/her understanding and acceptance.       Plan Discussed with: CRNA and Anesthesiologist  Anesthesia Plan Comments:         Anesthesia Quick Evaluation

## 2019-06-16 NOTE — Brief Op Note (Signed)
06/10/2019 - 06/16/2019  1:02 PM  PATIENT:  Luke Choi  48 y.o. male  PRE-OPERATIVE DIAGNOSIS:  left pneumothorax  POST-OPERATIVE DIAGNOSIS:  left pneumothorax  PROCEDURE:  Procedure(s): VIDEO ASSISTED THORACOSCOPY, wedge resection, mechanical pleurodesis (Left) Chest Tube Insertion (Right)  SURGEON:  Surgeon(s) and Role:    * Lightfoot, Lucile Crater, MD - Primary  PHYSICIAN ASSISTANT:  Nicholes Rough, PA-C   ANESTHESIA:   general  EBL:  50 mL   BLOOD ADMINISTERED:none  DRAINS: ROUTINE   LOCAL MEDICATIONS USED:  BUPIVICAINE   SPECIMEN:  Source of Specimen:  LEFT APICAL BLEB  DISPOSITION OF SPECIMEN:  PATHOLOGY  COUNTS:  YES  DICTATION: .Dragon Dictation  PLAN OF CARE: Admit to inpatient   PATIENT DISPOSITION:  PACU - hemodynamically stable.   Delay start of Pharmacological VTE agent (>24hrs) due to surgical blood loss or risk of bleeding: yes

## 2019-06-17 ENCOUNTER — Encounter (HOSPITAL_COMMUNITY): Payer: Self-pay | Admitting: Thoracic Surgery (Cardiothoracic Vascular Surgery)

## 2019-06-17 LAB — BASIC METABOLIC PANEL
Anion gap: 8 (ref 5–15)
BUN: 13 mg/dL (ref 6–20)
CO2: 24 mmol/L (ref 22–32)
Calcium: 8.3 mg/dL — ABNORMAL LOW (ref 8.9–10.3)
Chloride: 99 mmol/L (ref 98–111)
Creatinine, Ser: 0.42 mg/dL — ABNORMAL LOW (ref 0.61–1.24)
GFR calc Af Amer: >60 mL/min (ref >60)
GFR calc non Af Amer: >60 mL/min (ref >60)
Glucose, Bld: 120 mg/dL — ABNORMAL HIGH (ref 70–99)
Potassium: 3.5 mmol/L (ref 3.5–5.1)
Sodium: 131 mmol/L — ABNORMAL LOW (ref 135–145)

## 2019-06-17 LAB — BLOOD GAS, ARTERIAL
Acid-Base Excess: 2.7 mmol/L — ABNORMAL HIGH (ref 0.0–2.0)
Bicarbonate: 26.3 mmol/L (ref 20.0–28.0)
FIO2: 21
O2 Saturation: 98.5 %
Patient temperature: 37
pCO2 arterial: 37.4 mmHg (ref 32.0–48.0)
pH, Arterial: 7.461 — ABNORMAL HIGH (ref 7.350–7.450)
pO2, Arterial: 107 mmHg (ref 83.0–108.0)

## 2019-06-17 LAB — GLUCOSE, CAPILLARY
Glucose-Capillary: 81 mg/dL (ref 70–99)
Glucose-Capillary: 129 mg/dL — ABNORMAL HIGH (ref 70–99)
Glucose-Capillary: 109 mg/dL — ABNORMAL HIGH (ref 70–99)
Glucose-Capillary: 102 mg/dL — ABNORMAL HIGH (ref 70–99)
Glucose-Capillary: 93 mg/dL (ref 70–99)
Glucose-Capillary: 101 mg/dL — ABNORMAL HIGH (ref 70–99)
Glucose-Capillary: 105 mg/dL — ABNORMAL HIGH (ref 70–99)

## 2019-06-17 LAB — CBC
HCT: 32.8 % — ABNORMAL LOW (ref 39.0–52.0)
Hemoglobin: 10.1 g/dL — ABNORMAL LOW (ref 13.0–17.0)
MCH: 28 pg (ref 26.0–34.0)
MCHC: 30.8 g/dL (ref 30.0–36.0)
MCV: 90.9 fL (ref 80.0–100.0)
Platelets: 436 10*3/uL — ABNORMAL HIGH (ref 150–400)
RBC: 3.61 MIL/uL — ABNORMAL LOW (ref 4.22–5.81)
RDW: 14.7 % (ref 11.5–15.5)
WBC: 12.1 10*3/uL — ABNORMAL HIGH (ref 4.0–10.5)
nRBC: 0 % (ref 0.0–0.2)

## 2019-06-17 LAB — HEPARIN LEVEL (UNFRACTIONATED): Heparin Unfractionated: 0.13 [IU]/mL — ABNORMAL LOW (ref 0.30–0.70)

## 2019-06-17 LAB — APTT: aPTT: 41 s — ABNORMAL HIGH (ref 24–36)

## 2019-06-17 LAB — SURGICAL PATHOLOGY

## 2019-06-17 MED ORDER — OSMOLITE 1.5 CAL PO LIQD
1000.0000 mL | ORAL | Status: DC
  Administered 2019-06-17 – 2019-06-23 (×7): 1000 mL
  Filled 2019-06-17 (×7): qty 1000

## 2019-06-17 MED ORDER — METOPROLOL TARTRATE 5 MG/5ML IV SOLN
5.0000 mg | INTRAVENOUS | Status: DC | PRN
  Administered 2019-06-17 – 2019-06-23 (×8): 5 mg via INTRAVENOUS
  Filled 2019-06-17 (×10): qty 5

## 2019-06-17 MED ORDER — ENSURE ENLIVE PO LIQD
237.0000 mL | Freq: Two times a day (BID) | ORAL | Status: DC
  Administered 2019-06-17 – 2019-07-05 (×27): 237 mL via ORAL

## 2019-06-17 MED ORDER — METOPROLOL TARTRATE 5 MG/5ML IV SOLN
INTRAVENOUS | Status: AC
  Filled 2019-06-17: qty 5

## 2019-06-17 MED ORDER — HEPARIN (PORCINE) 25000 UT/250ML-% IV SOLN
1650.0000 [IU]/h | INTRAVENOUS | Status: AC
  Administered 2019-06-17: 1200 [IU]/h via INTRAVENOUS
  Administered 2019-06-18 (×2): 1550 [IU]/h via INTRAVENOUS
  Administered 2019-06-19 – 2019-06-20 (×2): 1650 [IU]/h via INTRAVENOUS
  Filled 2019-06-17 (×5): qty 250

## 2019-06-17 MED ORDER — HEPARIN BOLUS VIA INFUSION
1700.0000 [IU] | Freq: Once | INTRAVENOUS | Status: AC
  Administered 2019-06-17: 1700 [IU] via INTRAVENOUS
  Filled 2019-06-17: qty 1700

## 2019-06-17 MED ORDER — PRO-STAT SUGAR FREE PO LIQD
60.0000 mL | Freq: Two times a day (BID) | ORAL | Status: DC
  Administered 2019-06-17 – 2019-06-26 (×19): 60 mL
  Filled 2019-06-17 (×18): qty 60

## 2019-06-17 MED ORDER — POTASSIUM CHLORIDE 20 MEQ/15ML (10%) PO SOLN
20.0000 meq | ORAL | Status: AC
  Administered 2019-06-17 (×3): 20 meq
  Filled 2019-06-17 (×3): qty 15
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
## 2019-06-17 NOTE — Progress Notes (Signed)
Patient complaining of shortness of breath, tightness in the throat, pressure in the left chest, and feeling hot. The patient's baseline today has been sinus tach in the "one teens," but now it is in the 130s. EKG showed no abnormalities. The doctor was paged and ordered 5mg  IV Metoprolol, which was delivered. Patient expressed some relief and is now resting with HR in the 120s. Will continue to monitor.
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
## 2019-06-17 NOTE — Progress Notes (Signed)
Orthopedic Tech Progress Note Patient Details:  Luke Choi August 30, 1970 [id number]  Ortho Devices Type of Ortho Device: Prafo boot/shoe Ortho Device/Splint Location: left Ortho Device/Splint Interventions: Application   Post Interventions Patient Tolerated: Well Instructions Provided: Care of device   [name] 06/17/2019, 12:02 PM

## 2019-06-18 ENCOUNTER — Inpatient Hospital Stay (HOSPITAL_COMMUNITY): Payer: BC Managed Care – PPO

## 2019-06-18 LAB — COMPREHENSIVE METABOLIC PANEL
ALT: 37 U/L (ref 0–44)
AST: 20 U/L (ref 15–41)
Albumin: 2.6 g/dL — ABNORMAL LOW (ref 3.5–5.0)
Alkaline Phosphatase: 111 U/L (ref 38–126)
Anion gap: 11 (ref 5–15)
BUN: 15 mg/dL (ref 6–20)
CO2: 22 mmol/L (ref 22–32)
Calcium: 8.6 mg/dL — ABNORMAL LOW (ref 8.9–10.3)
Chloride: 98 mmol/L (ref 98–111)
Creatinine, Ser: 0.43 mg/dL — ABNORMAL LOW (ref 0.61–1.24)
GFR calc Af Amer: >60 mL/min (ref >60)
GFR calc non Af Amer: >60 mL/min (ref >60)
Glucose, Bld: 106 mg/dL — ABNORMAL HIGH (ref 70–99)
Potassium: 3.9 mmol/L (ref 3.5–5.1)
Sodium: 131 mmol/L — ABNORMAL LOW (ref 135–145)
Total Bilirubin: 0.3 mg/dL (ref 0.3–1.2)
Total Protein: 6.6 g/dL (ref 6.5–8.1)

## 2019-06-18 LAB — APTT: aPTT: 58 s — ABNORMAL HIGH (ref 24–36)

## 2019-06-18 LAB — CBC
HCT: 34.7 % — ABNORMAL LOW (ref 39.0–52.0)
Hemoglobin: 11.1 g/dL — ABNORMAL LOW (ref 13.0–17.0)
MCH: 28.3 pg (ref 26.0–34.0)
MCHC: 32 g/dL (ref 30.0–36.0)
MCV: 88.5 fL (ref 80.0–100.0)
Platelets: 427 10*3/uL — ABNORMAL HIGH (ref 150–400)
RBC: 3.92 MIL/uL — ABNORMAL LOW (ref 4.22–5.81)
RDW: 14.9 % (ref 11.5–15.5)
WBC: 16.5 10*3/uL — ABNORMAL HIGH (ref 4.0–10.5)
nRBC: 0 % (ref 0.0–0.2)

## 2019-06-18 LAB — GLUCOSE, CAPILLARY
Glucose-Capillary: 117 mg/dL — ABNORMAL HIGH (ref 70–99)
Glucose-Capillary: 101 mg/dL — ABNORMAL HIGH (ref 70–99)
Glucose-Capillary: 101 mg/dL — ABNORMAL HIGH (ref 70–99)
Glucose-Capillary: 94 mg/dL (ref 70–99)
Glucose-Capillary: 105 mg/dL — ABNORMAL HIGH (ref 70–99)

## 2019-06-18 LAB — HEPARIN LEVEL (UNFRACTIONATED)
Heparin Unfractionated: 0.29 [IU]/mL — ABNORMAL LOW (ref 0.30–0.70)
Heparin Unfractionated: 0.33 [IU]/mL (ref 0.30–0.70)

## 2019-06-18 MED ORDER — BISACODYL 10 MG RE SUPP
10.0000 mg | Freq: Every day | RECTAL | Status: DC
  Administered 2019-06-19 – 2019-06-29 (×3): 10 mg via RECTAL
  Filled 2019-06-18 (×5): qty 1
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
## 2019-06-18 NOTE — Progress Notes (Addendum)
2 Days Post-Op Procedure(s) (LRB): VIDEO ASSISTED THORACOSCOPY, wedge resection, mechanical pleurodesis (Left) Chest Tube Insertion (Right) Subjective: Resting in bed, opened his eyes to voice but otherwise unresponsive.   Objective: Vital signs in last 24 hours: Temp:  [97.6 F (36.4 C)-99.2 F (37.3 C)] 98.5 F (36.9 C) (11/07 0733) Pulse Rate:  [108-133] 123 (11/07 0733) Cardiac Rhythm: Sinus tachycardia (11/07 0701) Resp:  [16-23] 18 (11/07 0733) BP: (110-128)/(67-85) 122/79 (11/07 0733) SpO2:  [95 %-100 %] 96 % (11/07 0733)      Intake/Output from previous day: 11/06 0701 - 11/07 0700 In: 184.5 [P.O.:120; I.V.:64.5] Out: 1179 [Urine:800; Stool:1; Chest Tube:378] Intake/Output this shift: Total I/O In: 100 [P.O.:100] Out: -   General appearance: Resting quietly, opens eyes but O/W unresponsive.  Heart: s.tach. Lungs: breath sounds clear. CT's connected to pleruevacs at -40cmH2O suction. No air leak from eihter tube.  Wound: CT insertion sites clean and dry. No subQ air.   Lab Results: Recent Labs    06/17/19 0452 06/18/19 0002  WBC 12.1* 16.5*  HGB 10.1* 11.1*  HCT 32.8* 34.7*  PLT 436* 427*   BMET:  Recent Labs    06/17/19 0452 06/18/19 0002  NA 131* 131*  K 3.5 3.9  CL 99 98  CO2 24 22  GLUCOSE 120* 106*  BUN 13 15  CREATININE 0.42* 0.43*  CALCIUM 8.3* 8.6*    PT/INR: No results for input(s): LABPROT, INR in the last 72 hours. ABG    Component Value Date/Time   PHART 7.461 (H) 06/17/2019 0320   HCO3 26.3 06/17/2019 0320   TCO2 36 (H) 05/17/2019 0319   ACIDBASEDEF 1.6 04/29/2019 1112   O2SAT 98.5 06/17/2019 0320   CBG (last 3)  Recent Labs    06/17/19 2321 06/18/19 0355 06/18/19 0735  GLUCAP 105* 117* 101*    Assessment/Plan: S/P Procedure(s) (LRB): VIDEO ASSISTED THORACOSCOPY, wedge resection, mechanical pleurodesis (Left) Chest Tube Insertion (Right)  -POD-2 right tube thoracoscopy and left VATS LUL wedge resection and  pleurodesis for bilateral recurrent pneumothoraces. Both lungs well expanded on CXR and there are no air leaks. Will place both to water seal today. CXR in AM. Plan to return to OR Monday for right VATS for blebectomy and pleurodesis.    LOS: 8 days    Malon Kindle [phone] 06/18/2019 Patient seen and examined, agree with above Will leave left chest tube in place until after right VATS Remo Lipps C. Roxan Hockey, MD [facility] [phone]

## 2019-06-18 NOTE — Progress Notes (Signed)
[place] for Heparin Indication: DVT  Allergies  Allergen Reactions  . Bee Venom Anaphylaxis    Patient Measurements: Height: 5\' 10"  (177.8 cm) Weight: 154 lb 1.6 oz (69.9 kg) IBW/kg (Calculated) : 73  Heparin Dosing Weight: 69.9 kg  Vital Signs: Temp: 99.2 F (37.3 C) (11/06 2323) Temp Source: Oral (11/06 2323) BP: 115/75 (11/07 0000) Pulse Rate: 127 (11/07 0000)  Labs: Recent Labs    06/16/19 0338 06/17/19 0452 06/17/19 1648 06/18/19 0002  HGB 12.2* 10.1*  --  11.1*  HCT 38.3* 32.8*  --  34.7*  PLT 496* 436*  --  427*  APTT  --   --  41*  --   HEPARINUNFRC  --   --  0.13* 0.29*  CREATININE 0.52* 0.42*  --   --     Estimated Creatinine Clearance: 114.1 mL/min (A) (by C-G formula based on SCr of 0.42 mg/dL (L)).   Assessment: 48 y.o. male with DVT, Eliquis on hold, for heparin  Goal of Therapy:  Heparin level 0.3-0.7 units/mL Monitor platelets by anticoagulation protocol: Yes   Plan:  Increase Heparin 1550 units/hr  Phillis Knack, PharmD, BCPS  06/18/2019, 12:42 AM
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
## 2019-06-19 ENCOUNTER — Inpatient Hospital Stay (HOSPITAL_COMMUNITY): Payer: BC Managed Care – PPO

## 2019-06-19 LAB — CBC
HCT: 33.9 % — ABNORMAL LOW (ref 39.0–52.0)
Hemoglobin: 10.5 g/dL — ABNORMAL LOW (ref 13.0–17.0)
MCH: 28 pg (ref 26.0–34.0)
MCHC: 31 g/dL (ref 30.0–36.0)
MCV: 90.4 fL (ref 80.0–100.0)
Platelets: 430 10*3/uL — ABNORMAL HIGH (ref 150–400)
RBC: 3.75 MIL/uL — ABNORMAL LOW (ref 4.22–5.81)
RDW: 14.8 % (ref 11.5–15.5)
WBC: 12.8 10*3/uL — ABNORMAL HIGH (ref 4.0–10.5)
nRBC: 0 % (ref 0.0–0.2)

## 2019-06-19 LAB — GLUCOSE, CAPILLARY
Glucose-Capillary: 112 mg/dL — ABNORMAL HIGH (ref 70–99)
Glucose-Capillary: 113 mg/dL — ABNORMAL HIGH (ref 70–99)
Glucose-Capillary: 114 mg/dL — ABNORMAL HIGH (ref 70–99)
Glucose-Capillary: 79 mg/dL (ref 70–99)
Glucose-Capillary: 92 mg/dL (ref 70–99)
Glucose-Capillary: 92 mg/dL (ref 70–99)
Glucose-Capillary: 93 mg/dL (ref 70–99)

## 2019-06-19 LAB — HEPARIN LEVEL (UNFRACTIONATED): Heparin Unfractionated: 0.26 IU/mL — ABNORMAL LOW (ref 0.30–0.70)

## 2019-06-19 NOTE — Progress Notes (Signed)
Upon my 8PM assessment last evening, the patient's chest tubes were both to water seal. Per the doctor's documentation, water seal was appropriate, however the orders are for -40cm H20.The on-call cardiothoracic physician told me to leave the chest tubes as is. Both CTs are currently to water seal per the doctor's verbal order.
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
## 2019-06-19 NOTE — Progress Notes (Addendum)
3 Days Post-Op Procedure(s) (LRB): VIDEO ASSISTED THORACOSCOPY, wedge resection, mechanical pleurodesis (Left) Chest Tube Insertion (Right) Subjective: Awake and more talkative today with appropriate responses.   Objective: Vital signs in last 24 hours: Temp:  [97.5 F (36.4 C)-99.1 F (37.3 C)] 97.5 F (36.4 C) (11/08 0715) Pulse Rate:  [105-119] 113 (11/08 0715) Cardiac Rhythm: Sinus tachycardia (11/08 0700) Resp:  [15-18] 16 (11/08 0715) BP: (98-124)/(63-77) 124/77 (11/08 0715) SpO2:  [95 %-99 %] 96 % (11/08 0715)  Hemodynamic parameters for last 24 hours:    Intake/Output from previous day: 11/07 0701 - 11/08 0700 In: 460 [P.O.:460] Out: 1550 [Urine:1400; Chest Tube:150] Intake/Output this shift: Total I/O In: -  Out: 450 [Urine:450]  Physical Exam General appearance: Resting quietly, no apparent distress. Heart: s.tach. Lungs: breath sounds clear. CT's connected to Pleruevacs on water seal for past 24 hours.  No air leak from either tube. No PTX on CXR.  Wound: CT insertion sites clean and dry. No subQ air.  Lab Results: Recent Labs    06/18/19 0002 06/19/19 0548  WBC 16.5* 12.8*  HGB 11.1* 10.5*  HCT 34.7* 33.9*  PLT 427* 430*   BMET:  Recent Labs    06/17/19 0452 06/18/19 0002  NA 131* 131*  K 3.5 3.9  CL 99 98  CO2 24 22  GLUCOSE 120* 106*  BUN 13 15  CREATININE 0.42* 0.43*  CALCIUM 8.3* 8.6*    PT/INR: No results for input(s): LABPROT, INR in the last 72 hours. ABG    Component Value Date/Time   PHART 7.461 (H) 06/17/2019 0320   HCO3 26.3 06/17/2019 0320   TCO2 36 (H) 05/17/2019 0319   ACIDBASEDEF 1.6 04/29/2019 1112   O2SAT 98.5 06/17/2019 0320   CBG (last 3)  Recent Labs    06/19/19 0024 06/19/19 0354 06/19/19 0719  GLUCAP 92 113* 35    Assessment/Plan: S/P Procedure(s) (LRB): VIDEO ASSISTED THORACOSCOPY, wedge resection, mechanical pleurodesis (Left) Chest Tube Insertion (Right)  -POD-3 right tube thoracoscopy and left  VATS LUL wedge resection and pleurodesis for bilateral recurrent pneumothoraces. Both lungs well expanded on CXR and there are no air leaks. Will leave both tubes to water seal today.  Plan to return to OR tomorrow for right VATS / blebectomy and pleurodesis.     LOS: 9 days    Malon Kindle [phone] 06/19/2019 Patient seen and examined, agree with above No air leak on either side Scheduled for Right VATS tomorrow  Revonda Standard. Roxan Hockey, MD [facility] [phone]

## 2019-06-20 ENCOUNTER — Inpatient Hospital Stay (HOSPITAL_COMMUNITY): Payer: BC Managed Care – PPO | Admitting: Certified Registered"

## 2019-06-20 ENCOUNTER — Inpatient Hospital Stay (HOSPITAL_COMMUNITY): Payer: BC Managed Care – PPO

## 2019-06-20 ENCOUNTER — Encounter (HOSPITAL_COMMUNITY): Admission: AD | Disposition: A | Discharge status: Rehabilitation Facility | Payer: Self-pay | Source: Ambulatory Visit

## 2019-06-20 DIAGNOSIS — J95811 Postprocedural pneumothorax: Secondary | ICD-10-CM

## 2019-06-20 HISTORY — PX: VIDEO ASSISTED THORACOSCOPY (VATS)/WEDGE RESECTION: SHX6174

## 2019-06-20 HISTORY — PX: PLEURADESIS: SHX6030

## 2019-06-20 LAB — GLUCOSE, CAPILLARY
Glucose-Capillary: 88 mg/dL (ref 70–99)
Glucose-Capillary: 96 mg/dL (ref 70–99)
Glucose-Capillary: 138 mg/dL — ABNORMAL HIGH (ref 70–99)
Glucose-Capillary: 158 mg/dL — ABNORMAL HIGH (ref 70–99)
Glucose-Capillary: 110 mg/dL — ABNORMAL HIGH (ref 70–99)

## 2019-06-20 LAB — BASIC METABOLIC PANEL
Anion gap: 10 (ref 5–15)
BUN: 13 mg/dL (ref 6–20)
CO2: 22 mmol/L (ref 22–32)
Calcium: 8.8 mg/dL — ABNORMAL LOW (ref 8.9–10.3)
Chloride: 101 mmol/L (ref 98–111)
Creatinine, Ser: 0.37 mg/dL — ABNORMAL LOW (ref 0.61–1.24)
GFR calc Af Amer: >60 mL/min (ref >60)
GFR calc non Af Amer: >60 mL/min (ref >60)
Glucose, Bld: 100 mg/dL — ABNORMAL HIGH (ref 70–99)
Potassium: 4.3 mmol/L (ref 3.5–5.1)
Sodium: 133 mmol/L — ABNORMAL LOW (ref 135–145)

## 2019-06-20 LAB — CBC
HCT: 33.6 % — ABNORMAL LOW (ref 39.0–52.0)
Hemoglobin: 10.5 g/dL — ABNORMAL LOW (ref 13.0–17.0)
MCH: 28.2 pg (ref 26.0–34.0)
MCHC: 31.3 g/dL (ref 30.0–36.0)
MCV: 90.3 fL (ref 80.0–100.0)
Platelets: 468 10*3/uL — ABNORMAL HIGH (ref 150–400)
RBC: 3.72 MIL/uL — ABNORMAL LOW (ref 4.22–5.81)
RDW: 15 % (ref 11.5–15.5)
WBC: 14.6 10*3/uL — ABNORMAL HIGH (ref 4.0–10.5)
nRBC: 0 % (ref 0.0–0.2)

## 2019-06-20 LAB — TYPE AND SCREEN
ABO/RH(D): B POS
Antibody Screen: NEGATIVE

## 2019-06-20 LAB — SURGICAL PCR SCREEN
MRSA, PCR: NEGATIVE
Staphylococcus aureus: NEGATIVE

## 2019-06-20 LAB — HEPARIN LEVEL (UNFRACTIONATED): Heparin Unfractionated: 0.24 IU/mL — ABNORMAL LOW (ref 0.30–0.70)

## 2019-06-20 SURGERY — VIDEO ASSISTED THORACOSCOPY (VATS)/WEDGE RESECTION
Anesthesia: General | Site: Chest | Laterality: Right

## 2019-06-20 MED ORDER — DEXAMETHASONE SODIUM PHOSPHATE 10 MG/ML IJ SOLN
INTRAMUSCULAR | Status: DC | PRN
  Administered 2019-06-20: 10 mg via INTRAVENOUS

## 2019-06-20 MED ORDER — ESMOLOL HCL 100 MG/10ML IV SOLN
INTRAVENOUS | Status: AC
  Filled 2019-06-20: qty 10

## 2019-06-20 MED ORDER — FENTANYL CITRATE (PF) 100 MCG/2ML IJ SOLN
INTRAMUSCULAR | Status: AC
  Filled 2019-06-20: qty 2

## 2019-06-20 MED ORDER — CEFAZOLIN SODIUM-DEXTROSE 1-4 GM/50ML-% IV SOLN
1.0000 g | Freq: Once | INTRAVENOUS | Status: AC
  Administered 2019-06-20: 1 g via INTRAVENOUS
  Filled 2019-06-20: qty 50

## 2019-06-20 MED ORDER — 0.9 % SODIUM CHLORIDE (POUR BTL) OPTIME
TOPICAL | Status: DC | PRN
  Administered 2019-06-20: 2000 mL
  Administered 2019-06-20: 1000 mL

## 2019-06-20 MED ORDER — PROPOFOL 10 MG/ML IV BOLUS
INTRAVENOUS | Status: AC
  Filled 2019-06-20: qty 20

## 2019-06-20 MED ORDER — CEFAZOLIN SODIUM 1 G IJ SOLR
INTRAMUSCULAR | Status: AC
  Filled 2019-06-20: qty 20

## 2019-06-20 MED ORDER — CEFAZOLIN SODIUM-DEXTROSE 2-3 GM-%(50ML) IV SOLR
INTRAVENOUS | Status: DC | PRN
  Administered 2019-06-20: 2 g via INTRAVENOUS

## 2019-06-20 MED ORDER — SUGAMMADEX SODIUM 200 MG/2ML IV SOLN
INTRAVENOUS | Status: DC | PRN
  Administered 2019-06-20: 150 mg via INTRAVENOUS

## 2019-06-20 MED ORDER — METOPROLOL TARTRATE 5 MG/5ML IV SOLN
INTRAVENOUS | Status: AC
  Filled 2019-06-20: qty 5

## 2019-06-20 MED ORDER — LACTATED RINGERS IV SOLN
INTRAVENOUS | Status: DC | PRN
  Administered 2019-06-20 (×2): via INTRAVENOUS

## 2019-06-20 MED ORDER — METOPROLOL TARTRATE 5 MG/5ML IV SOLN
INTRAVENOUS | Status: DC | PRN
  Administered 2019-06-20 (×3): 1 mg via INTRAVENOUS

## 2019-06-20 MED ORDER — BUPIVACAINE LIPOSOME 1.3 % IJ SUSP
INTRAMUSCULAR | Status: DC | PRN
  Administered 2019-06-20: 20 mL

## 2019-06-20 MED ORDER — FENTANYL CITRATE (PF) 250 MCG/5ML IJ SOLN
INTRAMUSCULAR | Status: DC | PRN
  Administered 2019-06-20 (×5): 50 ug via INTRAVENOUS

## 2019-06-20 MED ORDER — DEXMEDETOMIDINE HCL 200 MCG/2ML IV SOLN
INTRAVENOUS | Status: DC | PRN
  Administered 2019-06-20 (×2): 4 ug via INTRAVENOUS

## 2019-06-20 MED ORDER — BUPIVACAINE HCL (PF) 0.5 % IJ SOLN
INTRAMUSCULAR | Status: AC
  Filled 2019-06-20: qty 30

## 2019-06-20 MED ORDER — MIDAZOLAM HCL 2 MG/2ML IJ SOLN
INTRAMUSCULAR | Status: AC
  Filled 2019-06-20: qty 2

## 2019-06-20 MED ORDER — ROCURONIUM BROMIDE 100 MG/10ML IV SOLN
INTRAVENOUS | Status: DC | PRN
  Administered 2019-06-20: 30 mg via INTRAVENOUS
  Administered 2019-06-20: 70 mg via INTRAVENOUS

## 2019-06-20 MED ORDER — FENTANYL CITRATE (PF) 250 MCG/5ML IJ SOLN
INTRAMUSCULAR | Status: AC
  Filled 2019-06-20: qty 5

## 2019-06-20 MED ORDER — ONDANSETRON HCL 4 MG/2ML IJ SOLN
INTRAMUSCULAR | Status: DC | PRN
  Administered 2019-06-20: 4 mg via INTRAVENOUS

## 2019-06-20 MED ORDER — SODIUM CHLORIDE (PF) 0.9 % IJ SOLN
INTRAMUSCULAR | Status: DC | PRN
  Administered 2019-06-20: 50 mL

## 2019-06-20 MED ORDER — PROPOFOL 10 MG/ML IV BOLUS
INTRAVENOUS | Status: AC
  Filled 2019-06-20: qty 40

## 2019-06-20 MED ORDER — PROPOFOL 10 MG/ML IV BOLUS
INTRAVENOUS | Status: DC | PRN
  Administered 2019-06-20: 40 mg via INTRAVENOUS
  Administered 2019-06-20 (×2): 50 mg via INTRAVENOUS
  Administered 2019-06-20: 60 mg via INTRAVENOUS

## 2019-06-20 MED ORDER — LACTATED RINGERS IV SOLN
INTRAVENOUS | Status: DC
  Administered 2019-06-20 (×2): via INTRAVENOUS

## 2019-06-20 MED ORDER — FENTANYL CITRATE (PF) 100 MCG/2ML IJ SOLN
25.0000 ug | INTRAMUSCULAR | Status: DC | PRN
  Administered 2019-06-20: 50 ug via INTRAVENOUS

## 2019-06-20 MED ORDER — BUPIVACAINE HCL 0.5 % IJ SOLN
INTRAMUSCULAR | Status: DC | PRN
  Administered 2019-06-20: 30 mL

## 2019-06-20 SURGICAL SUPPLY — 97 items
APPLIER CLIP ROT 10 11.4 M/L (STAPLE)
BLADE CLIPPER SURG (BLADE) ×3 IMPLANT
CANISTER SUCT 3000ML PPV (MISCELLANEOUS) ×3 IMPLANT
CATH ROBINSON RED A/P 14FR (CATHETERS) IMPLANT
CATH THORACIC 28FR (CATHETERS) IMPLANT
CATH THORACIC 28FR RT ANG (CATHETERS) IMPLANT
CATH THORACIC 36FR (CATHETERS) IMPLANT
CATH THORACIC 36FR RT ANG (CATHETERS) IMPLANT
CATH TROCAR 20FR (CATHETERS) IMPLANT
CLEANER TIP ELECTROSURG 2X2 (MISCELLANEOUS) ×3 IMPLANT
CLIP APPLIE ROT 10 11.4 M/L (STAPLE) IMPLANT
CLIP VESOCCLUDE MED 6/CT (CLIP) IMPLANT
CONN ST 1/4X3/8  BEN (MISCELLANEOUS)
CONN ST 1/4X3/8 BEN (MISCELLANEOUS) IMPLANT
CONN Y 3/8X3/8X3/8  BEN (MISCELLANEOUS)
CONN Y 3/8X3/8X3/8 BEN (MISCELLANEOUS) IMPLANT
CONT SPEC 4OZ CLIKSEAL STRL BL (MISCELLANEOUS) ×6 IMPLANT
COVER SURGICAL LIGHT HANDLE (MISCELLANEOUS) ×3 IMPLANT
COVER WAND RF STERILE (DRAPES) IMPLANT
DEFOGGER SCOPE WARMER CLEARIFY (MISCELLANEOUS) ×3 IMPLANT
DERMABOND ADVANCED (GAUZE/BANDAGES/DRESSINGS) ×2
DERMABOND ADVANCED .7 DNX12 (GAUZE/BANDAGES/DRESSINGS) ×1 IMPLANT
DISSECTOR BLUNT TIP ENDO 5MM (MISCELLANEOUS) IMPLANT
DRAIN CHANNEL 28F RND 3/8 FF (WOUND CARE) IMPLANT
DRAIN CHANNEL 32F RND 10.7 FF (WOUND CARE) IMPLANT
DRAPE WARM FLUID 44X44 (DRAPES) ×3 IMPLANT
ELECT BLADE 6.5 EXT (BLADE) ×3 IMPLANT
ELECT REM PT RETURN 9FT ADLT (ELECTROSURGICAL) ×3
ELECTRODE REM PT RTRN 9FT ADLT (ELECTROSURGICAL) ×1 IMPLANT
GAUZE SPONGE 4X4 12PLY STRL (GAUZE/BANDAGES/DRESSINGS) ×3 IMPLANT
GAUZE SPONGE 4X4 12PLY STRL LF (GAUZE/BANDAGES/DRESSINGS) ×3 IMPLANT
GLOVE BIO SURGEON STRL SZ7 (GLOVE) ×6 IMPLANT
GOWN STRL REUS W/ TWL LRG LVL3 (GOWN DISPOSABLE) ×2 IMPLANT
GOWN STRL REUS W/ TWL XL LVL3 (GOWN DISPOSABLE) ×1 IMPLANT
GOWN STRL REUS W/TWL LRG LVL3 (GOWN DISPOSABLE) ×4
GOWN STRL REUS W/TWL XL LVL3 (GOWN DISPOSABLE) ×2
HANDLE STAPLE ENDO GIA SHORT (STAPLE) ×2
HEMOSTAT SURGICEL 2X14 (HEMOSTASIS) IMPLANT
KIT BASIN OR (CUSTOM PROCEDURE TRAY) ×3 IMPLANT
KIT SUCTION CATH 14FR (SUCTIONS) IMPLANT
KIT TURNOVER KIT B (KITS) ×3 IMPLANT
NEEDLE 22X1 1/2 (OR ONLY) (NEEDLE) IMPLANT
NEEDLE SPNL 18GX3.5 QUINCKE PK (NEEDLE) IMPLANT
NS IRRIG 1000ML POUR BTL (IV SOLUTION) ×9 IMPLANT
PACK CHEST (CUSTOM PROCEDURE TRAY) ×3 IMPLANT
PACK UNIVERSAL I (CUSTOM PROCEDURE TRAY) ×3 IMPLANT
PAD ARMBOARD 7.5X6 YLW CONV (MISCELLANEOUS) ×6 IMPLANT
POUCH ENDO CATCH II 15MM (MISCELLANEOUS) IMPLANT
POUCH SPECIMEN RETRIEVAL 10MM (ENDOMECHANICALS) IMPLANT
RELOAD EGIA 45 MED/THCK PURPLE (STAPLE) ×3 IMPLANT
RELOAD EGIA BLACK ROTIC 45MM (STAPLE) ×3 IMPLANT
RELOAD TRI 45 ART MED THCK BLK (STAPLE) ×18 IMPLANT
RELOAD TRI 45 ART MED THCK PUR (STAPLE) ×12 IMPLANT
SCISSORS LAP 5X35 DISP (ENDOMECHANICALS) IMPLANT
SEALANT PROGEL (MISCELLANEOUS) IMPLANT
SEALANT SURG COSEAL 4ML (VASCULAR PRODUCTS) IMPLANT
SEALANT SURG COSEAL 8ML (VASCULAR PRODUCTS) IMPLANT
SEALER LIGASURE MARYLAND 30 (ELECTROSURGICAL) ×3 IMPLANT
SET IRRIG TUBING LAPAROSCOPIC (IRRIGATION / IRRIGATOR) IMPLANT
SOL ANTI FOG 6CC (MISCELLANEOUS) IMPLANT
SOLUTION ANTI FOG 6CC (MISCELLANEOUS)
SPECIMEN JAR MEDIUM (MISCELLANEOUS) IMPLANT
SPONGE INTESTINAL PEANUT (DISPOSABLE) ×6 IMPLANT
SPONGE TONSIL TAPE 1 RFD (DISPOSABLE) ×3 IMPLANT
STAPLER ENDO GIA 12MM SHORT (STAPLE) ×1 IMPLANT
STOPCOCK 4 WAY LG BORE MALE ST (IV SETS) ×3 IMPLANT
SUT MNCRL AB 3-0 PS2 18 (SUTURE) IMPLANT
SUT MON AB 2-0 CT1 36 (SUTURE) IMPLANT
SUT PDS AB 1 CTX 36 (SUTURE) IMPLANT
SUT PROLENE 4 0 RB 1 (SUTURE)
SUT PROLENE 4-0 RB1 .5 CRCL 36 (SUTURE) IMPLANT
SUT SILK  1 MH (SUTURE)
SUT SILK 1 MH (SUTURE) IMPLANT
SUT SILK 1 TIES 10X30 (SUTURE) ×3 IMPLANT
SUT SILK 2 0 SH (SUTURE) IMPLANT
SUT SILK 2 0SH CR/8 30 (SUTURE) IMPLANT
SUT VIC AB 1 CTX 36 (SUTURE)
SUT VIC AB 1 CTX36XBRD ANBCTR (SUTURE) IMPLANT
SUT VIC AB 2-0 CT1 27 (SUTURE) ×2
SUT VIC AB 2-0 CT1 TAPERPNT 27 (SUTURE) ×1 IMPLANT
SUT VIC AB 3-0 SH 27 (SUTURE) ×2
SUT VIC AB 3-0 SH 27X BRD (SUTURE) ×1 IMPLANT
SUT VICRYL 0 UR6 27IN ABS (SUTURE) ×3 IMPLANT
SUT VICRYL 2 TP 1 (SUTURE) IMPLANT
SYR 10ML LL (SYRINGE) ×3 IMPLANT
SYR 30ML LL (SYRINGE) ×3 IMPLANT
SYR 50ML LL SCALE MARK (SYRINGE) ×3 IMPLANT
SYSTEM SAHARA CHEST DRAIN ATS (WOUND CARE) ×3 IMPLANT
TAPE CLOTH 4X10 WHT NS (GAUZE/BANDAGES/DRESSINGS) ×3 IMPLANT
TAPE CLOTH SURG 4X10 WHT LF (GAUZE/BANDAGES/DRESSINGS) ×3 IMPLANT
TIP APPLICATOR SPRAY EXTEND 16 (VASCULAR PRODUCTS) IMPLANT
TOWEL GREEN STERILE (TOWEL DISPOSABLE) ×3 IMPLANT
TOWEL GREEN STERILE FF (TOWEL DISPOSABLE) ×3 IMPLANT
TRAY FOLEY MTR SLVR 16FR STAT (SET/KITS/TRAYS/PACK) IMPLANT
TROCAR XCEL BLADELESS 5X75MML (TROCAR) ×3 IMPLANT
TUBING EXTENTION W/L.L. (IV SETS) ×3 IMPLANT
WATER STERILE IRR 1000ML POUR (IV SOLUTION) ×3 IMPLANT

## 2019-06-20 NOTE — Transfer of Care (Signed)
Immediate Anesthesia Transfer of Care Note  Patient: Luke Choi  Procedure(s) Performed: VIDEO ASSISTED THORACOSCOPY (VATS)/ RIGHT UPPER LOBE LUNG WEDGE RESECTION (Right Chest) Pleuradesis (Right Chest)  Patient Location: PACU  Anesthesia Type:General  Level of Consciousness: awake  Airway & Oxygen Therapy: Patient Spontanous Breathing and Patient connected to nasal cannula oxygen  Post-op Assessment: Report given to RN and Post -op Vital signs reviewed and stable  Post vital signs: Reviewed and stable  Last Vitals:  Vitals Value Taken Time  BP 129/87 06/20/19 1258  Temp    Pulse 112 06/20/19 1259  Resp 23 06/20/19 1259  SpO2 100 % 06/20/19 1259  Vitals shown include unvalidated device data.  Last Pain:  Vitals:   06/20/19 0735  TempSrc: Oral  PainSc:          Complications: No apparent anesthesia complications

## 2019-06-20 NOTE — Anesthesia Procedure Notes (Signed)
Procedure Name: Intubation Date/Time: 06/20/2019 10:55 AM Performed by: Griffin Dakin, CRNA Pre-anesthesia Checklist: Patient identified, Emergency Drugs available, Suction available and Patient being monitored Patient Re-evaluated:Patient Re-evaluated prior to induction Oxygen Delivery Method: Circle system utilized Preoxygenation: Pre-oxygenation with 100% oxygen Induction Type: IV induction Ventilation: Mask ventilation without difficulty Laryngoscope Size: Glidescope and 3 Tube type: Oral Endobronchial tube: Left and Double lumen EBT and 39 Fr Number of attempts: 1 Airway Equipment and Method: Video-laryngoscopy and Rigid stylet Placement Confirmation: ETT inserted through vocal cords under direct vision,  positive ETCO2 and breath sounds checked- equal and bilateral Secured at: 31 cm Tube secured with: Tape Dental Injury: Teeth and Oropharynx as per pre-operative assessment
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
## 2019-06-20 NOTE — Progress Notes (Signed)
1583: Bright red blood in pts urine. Stopped heparin drip. Paged MD on call. MD is aware. Will continue to monitor.
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
## 2019-06-20 NOTE — Anesthesia Procedure Notes (Signed)
Arterial Line Insertion Start/End11/04/2019 9:40 AM, 06/20/2019 9:45 AM Performed by: Renato Shin, CRNA, CRNA  Patient location: Pre-op. Preanesthetic checklist: patient identified, IV checked, site marked, risks and benefits discussed, surgical consent, monitors and equipment checked, pre-op evaluation, timeout performed and anesthesia consent Lidocaine 1% used for infiltration Right, radial was placed Catheter size: 20 G Hand hygiene performed , maximum sterile barriers used  and Seldinger technique used Allen's test indicative of satisfactory collateral circulation Attempts: 1 Procedure performed without using ultrasound guided technique. Following insertion, dressing applied and Biopatch. Post procedure assessment: normal  Patient tolerated the procedure well with no immediate complications.

## 2019-06-20 NOTE — Progress Notes (Signed)
     [address]       [place] [address]             [phone]       No events  Vitals:   06/20/19 0302 06/20/19 0735  BP: 125/83 123/84  Pulse: (!) 123 (!) 111  Resp: 16 20  Temp: 98.1 F (36.7 C) 97.8 F (36.6 C)  SpO2: 98% 96%   NAD CT in position, no leak  Right recurrent pneumothorax OR today for right vats, wedge resection, and mechanical pleurodesis  Luke Choi
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
## 2019-06-20 NOTE — Brief Op Note (Signed)
06/10/2019 - 06/20/2019  12:17 PM  PATIENT:  Luke Choi  48 y.o. male  PRE-OPERATIVE DIAGNOSIS:  recurrent pneumothorax  POST-OPERATIVE DIAGNOSIS:  recurrent pneumothorax  PROCEDURE:  Procedure(s): VIDEO ASSISTED THORACOSCOPY (VATS)/ RIGHT UPPER LOBE LUNG WEDGE RESECTION (Right) Pleuradesis (Right)  SURGEON:  Surgeon(s) and Role:    * Lightfoot, Lucile Crater, MD - Primary  PHYSICIAN ASSISTANT: Charles Niese PA-C  ANESTHESIA:   general  EBL:  MINIMAL   BLOOD ADMINISTERED:none  DRAINS: 1 28 F  Chest Tube(s) in the RIGHT HEMITHORAX   LOCAL MEDICATIONS USED:  OTHER:EXPAREL  SPECIMEN:  Source of Specimen:  RUL WEDGE RESECTION  DISPOSITION OF SPECIMEN:  PATHOLOGY  COUNTS:  YES  TOURNIQUET:  * No tourniquets in log *  DICTATION: .Dragon Dictation  PLAN OF CARE: Admit to inpatient   PATIENT DISPOSITION:  PACU - hemodynamically stable.   Delay start of Pharmacological VTE agent (>24hrs) due to surgical blood loss or risk of bleeding: yes
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
## 2019-06-21 ENCOUNTER — Inpatient Hospital Stay (HOSPITAL_COMMUNITY): Payer: BC Managed Care – PPO

## 2019-06-21 ENCOUNTER — Encounter (HOSPITAL_COMMUNITY): Payer: Self-pay | Admitting: Thoracic Surgery (Cardiothoracic Vascular Surgery)

## 2019-06-21 LAB — HEPARIN LEVEL (UNFRACTIONATED)
Heparin Unfractionated: <0.1 IU/mL — ABNORMAL LOW (ref 0.30–0.70)
Heparin Unfractionated: 0.15 [IU]/mL — ABNORMAL LOW (ref 0.30–0.70)

## 2019-06-21 LAB — URINALYSIS, ROUTINE W REFLEX MICROSCOPIC
Bilirubin Urine: NEGATIVE
Glucose, UA: NEGATIVE mg/dL
Ketones, ur: 5 mg/dL — AB
Nitrite: NEGATIVE
Protein, ur: NEGATIVE mg/dL
Specific Gravity, Urine: 1.01 (ref 1.005–1.030)
WBC, UA: >50 WBC/hpf — ABNORMAL HIGH (ref 0–5)
pH: 7 (ref 5.0–8.0)

## 2019-06-21 LAB — CBC
HCT: 31.1 % — ABNORMAL LOW (ref 39.0–52.0)
Hemoglobin: 10 g/dL — ABNORMAL LOW (ref 13.0–17.0)
MCH: 28.8 pg (ref 26.0–34.0)
MCHC: 32.2 g/dL (ref 30.0–36.0)
MCV: 89.6 fL (ref 80.0–100.0)
Platelets: 472 10*3/uL — ABNORMAL HIGH (ref 150–400)
RBC: 3.47 MIL/uL — ABNORMAL LOW (ref 4.22–5.81)
RDW: 14.9 % (ref 11.5–15.5)
WBC: 15.3 10*3/uL — ABNORMAL HIGH (ref 4.0–10.5)
nRBC: 0 % (ref 0.0–0.2)

## 2019-06-21 LAB — GLUCOSE, CAPILLARY
Glucose-Capillary: 139 mg/dL — ABNORMAL HIGH (ref 70–99)
Glucose-Capillary: 79 mg/dL (ref 70–99)
Glucose-Capillary: 124 mg/dL — ABNORMAL HIGH (ref 70–99)
Glucose-Capillary: 161 mg/dL — ABNORMAL HIGH (ref 70–99)
Glucose-Capillary: 119 mg/dL — ABNORMAL HIGH (ref 70–99)
Glucose-Capillary: 150 mg/dL — ABNORMAL HIGH (ref 70–99)

## 2019-06-21 LAB — BASIC METABOLIC PANEL
Anion gap: 11 (ref 5–15)
BUN: 13 mg/dL (ref 6–20)
CO2: 26 mmol/L (ref 22–32)
Calcium: 8.6 mg/dL — ABNORMAL LOW (ref 8.9–10.3)
Chloride: 96 mmol/L — ABNORMAL LOW (ref 98–111)
Creatinine, Ser: 0.36 mg/dL — ABNORMAL LOW (ref 0.61–1.24)
GFR calc Af Amer: >60 mL/min (ref >60)
GFR calc non Af Amer: >60 mL/min (ref >60)
Glucose, Bld: 80 mg/dL (ref 70–99)
Potassium: 3.8 mmol/L (ref 3.5–5.1)
Sodium: 133 mmol/L — ABNORMAL LOW (ref 135–145)

## 2019-06-21 MED ORDER — HEPARIN (PORCINE) 25000 UT/250ML-% IV SOLN
1900.0000 [IU]/h | INTRAVENOUS | Status: AC
  Administered 2019-06-21: 1700 [IU]/h via INTRAVENOUS
  Administered 2019-06-22 (×2): 1900 [IU]/h via INTRAVENOUS
  Filled 2019-06-21 (×3): qty 250

## 2019-06-21 MED ORDER — NITROFURANTOIN MONOHYD MACRO 100 MG PO CAPS
100.0000 mg | ORAL_CAPSULE | Freq: Two times a day (BID) | ORAL | Status: DC
  Administered 2019-06-21 – 2019-07-02 (×22): 100 mg via ORAL
  Filled 2019-06-21 (×23): qty 1

## 2019-06-21 NOTE — Progress Notes (Addendum)
      [address]       [place] [address]             [phone]      1 Day Post-Op Procedure(s) (LRB): VIDEO ASSISTED THORACOSCOPY (VATS)/ RIGHT UPPER LOBE LUNG WEDGE RESECTION (Right) Pleuradesis (Right) Subjective: Feels okay this morning. Understands the plan of care for the day  Objective: Vital signs in last 24 hours: Temp:  [97 F (36.1 C)-99.1 F (37.3 C)] 98 F (36.7 C) (11/10 0801) Pulse Rate:  [108-119] 115 (11/10 0801) Cardiac Rhythm: Sinus tachycardia (11/10 0700) Resp:  [16-20] 20 (11/09 1935) BP: (116-133)/(79-94) 126/84 (11/10 0801) SpO2:  [92 %-100 %] 97 % (11/10 0801)     Intake/Output from previous day: 11/09 0701 - 11/10 0700 In: 2150.4 [I.V.:1511.6; NG/GT:638.8] Out: 2860 [Urine:2350; Blood:50; Chest Tube:460] Intake/Output this shift: Total I/O In: -  Out: 300 [Urine:300]  General appearance: alert, cooperative and no distress Heart: regular rate and rhythm, S1, S2 normal, no murmur, click, rub or gallop Lungs: clear to auscultation bilaterally Abdomen: soft, non-tender; bowel sounds normal; no masses,  no organomegaly Extremities: boots in place. no edema Wound: clean and dry  Lab Results: Recent Labs    06/20/19 0611 06/21/19 0750  WBC 14.6* 15.3*  HGB 10.5* 10.0*  HCT 33.6* 31.1*  PLT 468* 472*   BMET:  Recent Labs    06/20/19 0611 06/21/19 0750  NA 133* 133*  K 4.3 3.8  CL 101 96*  CO2 22 26  GLUCOSE 100* 80  BUN 13 13  CREATININE 0.37* 0.36*  CALCIUM 8.8* 8.6*    PT/INR: No results for input(s): LABPROT, INR in the last 72 hours. ABG    Component Value Date/Time   PHART 7.461 (H) 06/17/2019 0320   HCO3 26.3 06/17/2019 0320   TCO2 36 (H) 05/17/2019 0319   ACIDBASEDEF 1.6 04/29/2019 1112   O2SAT 98.5 06/17/2019 0320   CBG (last 3)  Recent Labs    06/20/19 2329 06/21/19 0353 06/21/19 0759  GLUCAP 110* 139* 79    Assessment/Plan: S/P Procedure(s) (LRB): VIDEO ASSISTED THORACOSCOPY  (VATS)/ RIGHT UPPER LOBE LUNG WEDGE RESECTION (Right) Pleuradesis (Right)  1. CV-ST, BP well controlled. Continue medical management 2. Pulm- CXR showed: Stable small bilateral apical pneumothoraces, Stable bilateral chest tubes, and Decreasing subcutaneous emphysema. 3. Renal-creatinine 0.36 4. H and H stable 5. Blood glucose well controlled  Plan: Small intermittent air leak on the right side-no air leak on the left. Probably remove left sided chest tubes today. Will work on getting a lung pillow for when he is coughing for support.    LOS: 11 days    Elgie Collard 06/21/2019  Agree with above Will remove left CT today Continue right tube to suction for now  [employer]

## 2019-06-21 NOTE — Anesthesia Postprocedure Evaluation (Addendum)
Anesthesia Post Note  Patient: Matty Vanroekel  Procedure(s) Performed: VIDEO ASSISTED THORACOSCOPY (VATS)/ RIGHT UPPER LOBE LUNG WEDGE RESECTION (Right Chest) Pleuradesis (Right Chest)     Patient location during evaluation: PACU Anesthesia Type: General Level of consciousness: awake and patient cooperative Pain management: pain level controlled Vital Signs Assessment: post-procedure vital signs reviewed and stable Respiratory status: spontaneous breathing, nonlabored ventilation, respiratory function stable and patient connected to nasal cannula oxygen Cardiovascular status: blood pressure returned to baseline and stable Postop Assessment: no apparent nausea or vomiting Anesthetic complications: no    Last Vitals:  Vitals:   06/21/19 0351 06/21/19 0801  BP:  126/84  Pulse:  (!) 115  Resp:    Temp: 36.7 C 36.7 C  SpO2:  97%    Last Pain:  Vitals:   06/21/19 0815  TempSrc:   PainSc: 0-No pain                 Riley Papin

## 2019-06-21 NOTE — Anesthesia Postprocedure Evaluation (Signed)
Anesthesia Post Note  Patient: Lemar Bakos  Procedure(s) Performed: VIDEO ASSISTED THORACOSCOPY (VATS)/ RIGHT UPPER LOBE LUNG WEDGE RESECTION (Right Chest) Pleuradesis (Right Chest)     Patient location during evaluation: PACU Anesthesia Type: General Level of consciousness: awake and alert Pain management: pain level controlled Vital Signs Assessment: post-procedure vital signs reviewed and stable Respiratory status: spontaneous breathing, nonlabored ventilation, respiratory function stable and patient connected to nasal cannula oxygen Cardiovascular status: blood pressure returned to baseline and stable Postop Assessment: no apparent nausea or vomiting Anesthetic complications: no    Last Vitals:  Vitals:   06/21/19 0351 06/21/19 0801  BP:  126/84  Pulse:  (!) 115  Resp:    Temp: 36.7 C 36.7 C  SpO2:  97%    Last Pain:  Vitals:   06/21/19 0815  TempSrc:   PainSc: 0-No pain                 Arliene Rosenow
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
## 2019-06-22 ENCOUNTER — Inpatient Hospital Stay (HOSPITAL_COMMUNITY): Payer: BC Managed Care – PPO

## 2019-06-22 LAB — BASIC METABOLIC PANEL
Anion gap: 10 (ref 5–15)
BUN: 13 mg/dL (ref 6–20)
CO2: 25 mmol/L (ref 22–32)
Calcium: 8.5 mg/dL — ABNORMAL LOW (ref 8.9–10.3)
Chloride: 99 mmol/L (ref 98–111)
Creatinine, Ser: 0.33 mg/dL — ABNORMAL LOW (ref 0.61–1.24)
GFR calc Af Amer: >60 mL/min (ref >60)
GFR calc non Af Amer: >60 mL/min (ref >60)
Glucose, Bld: 132 mg/dL — ABNORMAL HIGH (ref 70–99)
Potassium: 4.4 mmol/L (ref 3.5–5.1)
Sodium: 134 mmol/L — ABNORMAL LOW (ref 135–145)

## 2019-06-22 LAB — SURGICAL PATHOLOGY

## 2019-06-22 LAB — HEPARIN LEVEL (UNFRACTIONATED)
Heparin Unfractionated: 0.34 [IU]/mL (ref 0.30–0.70)
Heparin Unfractionated: 0.42 [IU]/mL (ref 0.30–0.70)

## 2019-06-22 LAB — CBC
HCT: 31.8 % — ABNORMAL LOW (ref 39.0–52.0)
Hemoglobin: 10 g/dL — ABNORMAL LOW (ref 13.0–17.0)
MCH: 28.2 pg (ref 26.0–34.0)
MCHC: 31.4 g/dL (ref 30.0–36.0)
MCV: 89.8 fL (ref 80.0–100.0)
Platelets: 415 10*3/uL — ABNORMAL HIGH (ref 150–400)
RBC: 3.54 MIL/uL — ABNORMAL LOW (ref 4.22–5.81)
RDW: 15.1 % (ref 11.5–15.5)
WBC: 15.7 10*3/uL — ABNORMAL HIGH (ref 4.0–10.5)
nRBC: 0 % (ref 0.0–0.2)

## 2019-06-22 LAB — GLUCOSE, CAPILLARY
Glucose-Capillary: 110 mg/dL — ABNORMAL HIGH (ref 70–99)
Glucose-Capillary: 114 mg/dL — ABNORMAL HIGH (ref 70–99)
Glucose-Capillary: 126 mg/dL — ABNORMAL HIGH (ref 70–99)
Glucose-Capillary: 143 mg/dL — ABNORMAL HIGH (ref 70–99)
Glucose-Capillary: 148 mg/dL — ABNORMAL HIGH (ref 70–99)
Glucose-Capillary: 95 mg/dL (ref 70–99)

## 2019-06-22 MED ORDER — HYDROMORPHONE HCL 1 MG/ML IJ SOLN: 0.5000 mg | INTRAMUSCULAR | Status: DC | PRN

## 2019-06-22 MED ORDER — APIXABAN 5 MG PO TABS
5.0000 mg | ORAL_TABLET | Freq: Two times a day (BID) | ORAL | Status: DC
  Administered 2019-06-22 – 2019-07-05 (×26): 5 mg via ORAL
  Filled 2019-06-22 (×27): qty 1

## 2019-06-22 MED ORDER — CLONAZEPAM 0.25 MG PO TBDP
0.2500 mg | ORAL_TABLET | Freq: Two times a day (BID) | ORAL | Status: DC
  Administered 2019-06-22 – 2019-06-25 (×7): 0.25 mg via ORAL
  Filled 2019-06-22 (×7): qty 1

## 2019-06-22 MED ORDER — DOCUSATE SODIUM 50 MG/5ML PO LIQD
50.0000 mg | Freq: Every day | ORAL | Status: DC
  Administered 2019-06-22 – 2019-06-26 (×4): 50 mg
  Filled 2019-06-22 (×4): qty 10

## 2019-06-22 MED ORDER — TRAMADOL HCL 50 MG PO TABS
50.0000 mg | ORAL_TABLET | Freq: Two times a day (BID) | ORAL | Status: DC
  Administered 2019-06-22 – 2019-07-05 (×27): 50 mg via ORAL
  Filled 2019-06-22 (×27): qty 1

## 2019-06-22 MED ORDER — QUETIAPINE FUMARATE 50 MG PO TABS
50.0000 mg | ORAL_TABLET | Freq: Two times a day (BID) | ORAL | Status: DC
  Administered 2019-06-22 – 2019-06-25 (×7): 50 mg via ORAL
  Filled 2019-06-22 (×7): qty 1
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
## 2019-06-22 NOTE — Progress Notes (Signed)
2 Days Post-Op  Subjective: Resting in bedside chair.  No LLE pain. No complaints.   Objective: Vital signs in last 24 hours: Temp:  [97.8 F (36.6 C)-100.1 F (37.8 C)] 97.8 F (36.6 C) (11/11 1311) Pulse Rate:  [121-125] 125 (11/11 1311) Resp:  [15-25] 15 (11/11 1311) BP: (107-123)/(74-86) 107/84 (11/11 1311) SpO2:  [94 %-98 %] 97 % (11/11 1311) Last BM Date: 06/19/19  Intake/Output from previous day: 11/10 0701 - 11/11 0700 In: 1833.2 [P.O.:420; I.V.:304.5; NG/GT:1108.8] Out: 1495 [Urine:1300; Chest Tube:195] Intake/Output this shift: Total I/O In: 866 [P.O.:360; NG/GT:506] Out: -   General appearance: alert, cooperative, no distress and sitting up in chair Pulmonary: Unlabored, symmetric rise and fall.  Pulses: 2+ and symmetric Abdomen: Soft, non-tender Extremities: LLE without edema. Incision/Wound: LLE wound s/p Acell placement well healed with small scab.  LLE incisional wound with moist/loose scabbing noted and slight sloughing of scab with underlying granulation tissue. Vaseline gauze in place. Non tender to palpation. No active drainage or bleeding. No purulence.  Lab Results:  CBC    Component Value Date/Time   WBC 15.7 (H) 06/22/2019 0113   RBC 3.54 (L) 06/22/2019 0113   HGB 10.0 (L) 06/22/2019 0113   HCT 31.8 (L) 06/22/2019 0113   PLT 415 (H) 06/22/2019 0113   MCV 89.8 06/22/2019 0113   MCH 28.2 06/22/2019 0113   MCHC 31.4 06/22/2019 0113   RDW 15.1 06/22/2019 0113   LYMPHSABS 1.3 06/10/2019 0503   MONOABS 1.2 (H) 06/10/2019 0503   EOSABS 0.3 06/10/2019 0503   BASOSABS 0.1 06/10/2019 0503    BMET Recent Labs    06/21/19 0750 06/22/19 0113  NA 133* 134*  K 3.8 4.4  CL 96* 99  CO2 26 25  GLUCOSE 80 132*  BUN 13 13  CREATININE 0.36* 0.33*  CALCIUM 8.6* 8.5*   PT/INR No results for input(s): LABPROT, INR in the last 72 hours. ABG No results for input(s): PHART, HCO3 in the last 72 hours.  Invalid input(s): PCO2,  PO2  Studies/Results: Dg Chest Port 1 View  Result Date: 06/22/2019 CLINICAL DATA:  Pneumothorax.  Bilateral chest tubes EXAM: PORTABLE CHEST 1 VIEW COMPARISON:  06/21/2019 FINDINGS: Bilateral chest tubes remain in place. No definite pneumothorax. Surgical staples in the upper lobes bilaterally. No significant effusion. Apical airspace disease bilaterally right greater than left is unchanged. Feeding tube enters the stomach with the tip not visualized. Central line in the SVC unchanged. IMPRESSION: Bilateral chest tubes.  No definite pneumothorax Apical airspace disease bilaterally unchanged. Electronically Signed   By: Marlan Palau M.D.   On: 06/22/2019 08:01   Dg Chest Port 1 View  Result Date: 06/21/2019 CLINICAL DATA:  Trauma. Bilateral chest tubes. Pneumothoraces. EXAM: PORTABLE CHEST 1 VIEW COMPARISON:  One-view chest 06/20/2019 FINDINGS: Bilateral chest tubes are stable in position. Left IJ line is in place. Feeding tube courses off the inferior border of the film. Small bilateral apical pneumothoraces remain. Interstitial lung changes and scarring is stable. Subcutaneous emphysema over the left chest wall is decreasing. IMPRESSION: 1. Stable small bilateral apical pneumothoraces. 2. Stable bilateral chest tubes. 3. Decreasing subcutaneous emphysema. Electronically Signed   By: Marin Roberts M.D.   On: 06/21/2019 05:50    Anti-infectives: Anti-infectives (From admission, onward)   Start     Dose/Rate Route Frequency Ordered Stop   06/21/19 2200  nitrofurantoin (macrocrystal-monohydrate) (MACROBID) capsule 100 mg     100 mg Oral Every 12 hours 06/21/19 1552     06/20/19 2000  ceFAZolin (ANCEF) IVPB 1 g/50 mL premix     1 g 100 mL/hr over 30 Minutes Intravenous  Once 06/20/19 1259 06/20/19 2058      Assessment/Plan: s/p Procedure(s):  Continue with vaseline, 4x4, ABD, Kerlix to LLE daily.   LLE wound has epithelialized with small scab noted.  LLE incisional wound with  sloughing scab, no infection, purulence, no active drainage.  Call with questions or concerns.  Pictures were obtained of the patient and placed in the chart with the patient's or guardian's permission.  Carola Rhine Maxine Huynh, PA-C 06/22/2019
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
## 2019-06-22 NOTE — Progress Notes (Signed)
Inpatient Rehab Admissions Coordinator:   Following for timing of possible readmission to CIR pending insurance auth.  Plan for R chest tube to be removed hopefully Thursday AM.    Shann Medal, PT, DPT Admissions Coordinator [phone] 06/22/19  11:46 AM
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
## 2019-06-23 ENCOUNTER — Inpatient Hospital Stay (HOSPITAL_COMMUNITY): Payer: BC Managed Care – PPO

## 2019-06-23 LAB — GLUCOSE, CAPILLARY
Glucose-Capillary: 111 mg/dL — ABNORMAL HIGH (ref 70–99)
Glucose-Capillary: 101 mg/dL — ABNORMAL HIGH (ref 70–99)
Glucose-Capillary: 121 mg/dL — ABNORMAL HIGH (ref 70–99)
Glucose-Capillary: 194 mg/dL — ABNORMAL HIGH (ref 70–99)
Glucose-Capillary: 125 mg/dL — ABNORMAL HIGH (ref 70–99)
Glucose-Capillary: 110 mg/dL — ABNORMAL HIGH (ref 70–99)

## 2019-06-23 LAB — CBC
HCT: 32.3 % — ABNORMAL LOW (ref 39.0–52.0)
Hemoglobin: 10.1 g/dL — ABNORMAL LOW (ref 13.0–17.0)
MCH: 28.5 pg (ref 26.0–34.0)
MCHC: 31.3 g/dL (ref 30.0–36.0)
MCV: 91 fL (ref 80.0–100.0)
Platelets: 452 10*3/uL — ABNORMAL HIGH (ref 150–400)
RBC: 3.55 MIL/uL — ABNORMAL LOW (ref 4.22–5.81)
RDW: 14.7 % (ref 11.5–15.5)
WBC: 12.4 10*3/uL — ABNORMAL HIGH (ref 4.0–10.5)
nRBC: 0 % (ref 0.0–0.2)

## 2019-06-23 MED ORDER — WHITE PETROLATUM EX OINT
TOPICAL_OINTMENT | CUTANEOUS | Status: AC
  Administered 2019-06-23: 1
  Filled 2019-06-23: qty 28.35
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
## 2019-06-23 NOTE — Progress Notes (Signed)
Inpatient Rehab Admissions Coordinator:   Met with pt at bedside.  L chest tube out yesterday, R chest tube out today.  Hopeful to transition to CIR tomorrow pending insurance authorization and bed availability. I will update wife today on CIR.   Shann Medal, PT, DPT Admissions Coordinator [phone] 06/23/19  11:44 AM
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
## 2019-06-23 NOTE — Progress Notes (Addendum)
      [address]       [place] [address]             [phone]      3 Days Post-Op Procedure(s) (LRB): VIDEO ASSISTED THORACOSCOPY (VATS)/ RIGHT UPPER LOBE LUNG WEDGE RESECTION (Right) Pleuradesis (Right) Subjective: Feels okay this morning.   Objective: Vital signs in last 24 hours: Temp:  [97.5 F (36.4 C)-99.5 F (37.5 C)] 97.8 F (36.6 C) (11/12 0837) Pulse Rate:  [107-130] 120 (11/12 0837) Cardiac Rhythm: Sinus tachycardia (11/12 0700) Resp:  [15-21] 18 (11/12 0837) BP: (107-132)/(71-86) 129/81 (11/12 0837) SpO2:  [95 %-98 %] 97 % (11/12 0837)     Intake/Output from previous day: 11/11 0701 - 11/12 0700 In: 2257.3 [P.O.:600; I.V.:203.8; NG/GT:1453.5] Out: 1545 [Urine:1450; Chest Tube:95] Intake/Output this shift: No intake/output data recorded.  General appearance: alert, cooperative and no distress Heart: sinus tachycardia Lungs: clear to auscultation bilaterally Abdomen: soft, non-tender; bowel sounds normal; no masses,  no organomegaly Extremities: no edema Wound: clean and dry  Lab Results: Recent Labs    06/22/19 0113 06/23/19 0536  WBC 15.7* 12.4*  HGB 10.0* 10.1*  HCT 31.8* 32.3*  PLT 415* 452*   BMET:  Recent Labs    06/21/19 0750 06/22/19 0113  NA 133* 134*  K 3.8 4.4  CL 96* 99  CO2 26 25  GLUCOSE 80 132*  BUN 13 13  CREATININE 0.36* 0.33*  CALCIUM 8.6* 8.5*    PT/INR: No results for input(s): LABPROT, INR in the last 72 hours. ABG    Component Value Date/Time   PHART 7.461 (H) 06/17/2019 0320   HCO3 26.3 06/17/2019 0320   TCO2 36 (H) 05/17/2019 0319   ACIDBASEDEF 1.6 04/29/2019 1112   O2SAT 98.5 06/17/2019 0320   CBG (last 3)  Recent Labs    06/22/19 2338 06/23/19 0332 06/23/19 0835  GLUCAP 143* 111* 101*    Assessment/Plan: S/P Procedure(s) (LRB): VIDEO ASSISTED THORACOSCOPY (VATS)/ RIGHT UPPER LOBE LUNG WEDGE RESECTION (Right) Pleuradesis (Right)  1 hemodyn stable,+ sinus  Tachy,  No  recent fevers 2 sats good on RA 3 CXR stable and no air leak in the chest tube 4 Will remove right chest tube today-discussed with [name] 5 leukocytosis is pretty stable, no recent fevers 7 H & H improved 8 BS adeq controlled 9 creat slightly low- pretty stable, good UOP 10 on heparin for DVT, hopefully resume eliquis soon 11 primary management as per trauma svc  Plan: right chest tube removal today. No issues. CXR in the AM   LOS: 13 days    Elgie Collard 06/23/2019   Agree with above CT removed Will follow peripherally  Ines Warf O Keamber Macfadden
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
## 2019-06-24 ENCOUNTER — Inpatient Hospital Stay (HOSPITAL_COMMUNITY): Payer: BC Managed Care – PPO

## 2019-06-24 LAB — CBC
HCT: 32.8 % — ABNORMAL LOW (ref 39.0–52.0)
Hemoglobin: 10.4 g/dL — ABNORMAL LOW (ref 13.0–17.0)
MCH: 28.2 pg (ref 26.0–34.0)
MCHC: 31.7 g/dL (ref 30.0–36.0)
MCV: 88.9 fL (ref 80.0–100.0)
Platelets: 508 10*3/uL — ABNORMAL HIGH (ref 150–400)
RBC: 3.69 MIL/uL — ABNORMAL LOW (ref 4.22–5.81)
RDW: 14.7 % (ref 11.5–15.5)
WBC: 12.8 10*3/uL — ABNORMAL HIGH (ref 4.0–10.5)
nRBC: 0 % (ref 0.0–0.2)

## 2019-06-24 LAB — GLUCOSE, CAPILLARY
Glucose-Capillary: 110 mg/dL — ABNORMAL HIGH (ref 70–99)
Glucose-Capillary: 112 mg/dL — ABNORMAL HIGH (ref 70–99)
Glucose-Capillary: 114 mg/dL — ABNORMAL HIGH (ref 70–99)
Glucose-Capillary: 118 mg/dL — ABNORMAL HIGH (ref 70–99)
Glucose-Capillary: 121 mg/dL — ABNORMAL HIGH (ref 70–99)
Glucose-Capillary: 130 mg/dL — ABNORMAL HIGH (ref 70–99)

## 2019-06-24 MED ORDER — OSMOLITE 1.5 CAL PO LIQD
1000.0000 mL | ORAL | Status: DC
  Administered 2019-06-24 – 2019-06-25 (×2): 1000 mL
  Filled 2019-06-24 (×3): qty 1000
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
## 2019-06-24 NOTE — Progress Notes (Signed)
Inpatient Rehab Admissions Coordinator:   Called [employer] for update on prior auth.  Per customer service rep, pt's [employer] has been cancelled so no prior auth was processed.  I spoke with pt's significant other, [name], and she has already applied for COBRA coverage.  Will need to start process of prior auth with pt's COBRA policy on Monday.   Shann Medal, PT, DPT Admissions Coordinator [phone] 06/24/19  3:25 PM
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
## 2019-06-25 ENCOUNTER — Inpatient Hospital Stay (HOSPITAL_COMMUNITY): Payer: BC Managed Care – PPO

## 2019-06-25 LAB — CBC
HCT: 33 % — ABNORMAL LOW (ref 39.0–52.0)
Hemoglobin: 10.3 g/dL — ABNORMAL LOW (ref 13.0–17.0)
MCH: 27.9 pg (ref 26.0–34.0)
MCHC: 31.2 g/dL (ref 30.0–36.0)
MCV: 89.4 fL (ref 80.0–100.0)
Platelets: 577 10*3/uL — ABNORMAL HIGH (ref 150–400)
RBC: 3.69 MIL/uL — ABNORMAL LOW (ref 4.22–5.81)
RDW: 14.7 % (ref 11.5–15.5)
WBC: 11.5 10*3/uL — ABNORMAL HIGH (ref 4.0–10.5)
nRBC: 0 % (ref 0.0–0.2)

## 2019-06-25 LAB — GLUCOSE, CAPILLARY
Glucose-Capillary: 117 mg/dL — ABNORMAL HIGH (ref 70–99)
Glucose-Capillary: 115 mg/dL — ABNORMAL HIGH (ref 70–99)
Glucose-Capillary: 114 mg/dL — ABNORMAL HIGH (ref 70–99)
Glucose-Capillary: 137 mg/dL — ABNORMAL HIGH (ref 70–99)
Glucose-Capillary: 127 mg/dL — ABNORMAL HIGH (ref 70–99)
Glucose-Capillary: 113 mg/dL — ABNORMAL HIGH (ref 70–99)
Glucose-Capillary: 181 mg/dL — ABNORMAL HIGH (ref 70–99)

## 2019-06-25 MED ORDER — QUETIAPINE FUMARATE 50 MG PO TABS: 50.0000 mg | ORAL_TABLET | Freq: Every day | ORAL | Status: DC

## 2019-06-25 MED ORDER — LORAZEPAM 0.5 MG PO TABS
0.5000 mg | ORAL_TABLET | Freq: Four times a day (QID) | ORAL | Status: DC | PRN
  Administered 2019-06-26 – 2019-07-04 (×7): 0.5 mg via ORAL
  Filled 2019-06-25 (×7): qty 1

## 2019-06-25 MED ORDER — CLONAZEPAM 0.5 MG PO TBDP
0.5000 mg | ORAL_TABLET | Freq: Two times a day (BID) | ORAL | Status: DC
  Administered 2019-06-25 – 2019-06-29 (×9): 0.5 mg via ORAL
  Filled 2019-06-25 (×9): qty 1
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
## 2019-06-25 NOTE — Progress Notes (Signed)
Central line removed per order and line is intact. Vaseline gauze and gauze dressing applied and pressure held. Site clean, dry, and intact. Patient and RN aware that patient is on bedrest for 30 minutes. Patient aware to leave dressing on and dry for 24 hours.  

## 2019-06-26 LAB — GLUCOSE, CAPILLARY
Glucose-Capillary: 112 mg/dL — ABNORMAL HIGH (ref 70–99)
Glucose-Capillary: 93 mg/dL (ref 70–99)
Glucose-Capillary: 116 mg/dL — ABNORMAL HIGH (ref 70–99)
Glucose-Capillary: 158 mg/dL — ABNORMAL HIGH (ref 70–99)
Glucose-Capillary: 103 mg/dL — ABNORMAL HIGH (ref 70–99)
Glucose-Capillary: 101 mg/dL — ABNORMAL HIGH (ref 70–99)

## 2019-06-26 LAB — CBC
HCT: 37.2 % — ABNORMAL LOW (ref 39.0–52.0)
Hemoglobin: 11.8 g/dL — ABNORMAL LOW (ref 13.0–17.0)
MCH: 28.1 pg (ref 26.0–34.0)
MCHC: 31.7 g/dL (ref 30.0–36.0)
MCV: 88.6 fL (ref 80.0–100.0)
Platelets: 671 10*3/uL — ABNORMAL HIGH (ref 150–400)
RBC: 4.2 MIL/uL — ABNORMAL LOW (ref 4.22–5.81)
RDW: 14.6 % (ref 11.5–15.5)
WBC: 10.1 10*3/uL (ref 4.0–10.5)
nRBC: 0 % (ref 0.0–0.2)

## 2019-06-26 MED ORDER — QUETIAPINE FUMARATE 50 MG PO TABS
100.0000 mg | ORAL_TABLET | Freq: Every day | ORAL | Status: DC
  Administered 2019-06-26 – 2019-07-04 (×9): 100 mg via ORAL
  Filled 2019-06-26: qty 2
  Filled 2019-06-26 (×5): qty 1
  Filled 2019-06-26: qty 2
  Filled 2019-06-26 (×2): qty 1
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
## 2019-06-27 LAB — CBC
HCT: 37.4 % — ABNORMAL LOW (ref 39.0–52.0)
Hemoglobin: 11.8 g/dL — ABNORMAL LOW (ref 13.0–17.0)
MCH: 28 pg (ref 26.0–34.0)
MCHC: 31.6 g/dL (ref 30.0–36.0)
MCV: 88.6 fL (ref 80.0–100.0)
Platelets: 746 10*3/uL — ABNORMAL HIGH (ref 150–400)
RBC: 4.22 MIL/uL (ref 4.22–5.81)
RDW: 14.8 % (ref 11.5–15.5)
WBC: 9 10*3/uL (ref 4.0–10.5)
nRBC: 0 % (ref 0.0–0.2)

## 2019-06-27 LAB — GLUCOSE, CAPILLARY
Glucose-Capillary: 101 mg/dL — ABNORMAL HIGH (ref 70–99)
Glucose-Capillary: 179 mg/dL — ABNORMAL HIGH (ref 70–99)
Glucose-Capillary: 94 mg/dL (ref 70–99)
Glucose-Capillary: 93 mg/dL (ref 70–99)
Glucose-Capillary: 115 mg/dL — ABNORMAL HIGH (ref 70–99)
Glucose-Capillary: 102 mg/dL — ABNORMAL HIGH (ref 70–99)

## 2019-06-27 MED ORDER — DOCUSATE SODIUM 50 MG/5ML PO LIQD
50.0000 mg | Freq: Every day | ORAL | Status: DC
  Administered 2019-06-27 – 2019-06-29 (×2): 50 mg via ORAL
  Filled 2019-06-27 (×2): qty 10

## 2019-06-27 MED ORDER — JUVEN PO PACK
1.0000 | PACK | Freq: Two times a day (BID) | ORAL | Status: DC
  Administered 2019-06-27 – 2019-07-01 (×9): 1 via ORAL
  Filled 2019-06-27 (×10): qty 1

## 2019-06-27 MED ORDER — PRO-STAT SUGAR FREE PO LIQD
30.0000 mL | Freq: Two times a day (BID) | ORAL | Status: DC
  Administered 2019-06-27 – 2019-07-05 (×16): 30 mL via ORAL
  Filled 2019-06-27 (×16): qty 30

## 2019-06-27 MED ORDER — PRO-STAT SUGAR FREE PO LIQD
60.0000 mL | Freq: Two times a day (BID) | ORAL | Status: DC
  Administered 2019-06-27: 60 mL via ORAL
  Filled 2019-06-27: qty 60

## 2019-06-27 NOTE — Progress Notes (Signed)
Inpatient Rehab Admissions Coordinator:    Per [employer] representative, paperwork has not been received yet to reinstate pt's [employer] policy.  [name] to f/u with [employer] for current status as they would not release this information to me.  Will continue to follow and work towards insurance auth for admission pending approval.  Pt aware.   Shann Medal, PT, DPT Admissions Coordinator [phone] 06/27/19  2:39 PM
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
## 2019-06-27 NOTE — Progress Notes (Signed)
Removed coretrak. Patient tolerated well. Coretrak intact upon removal. Katherina Right RN
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
## 2019-06-28 LAB — CBC
HCT: 34.3 % — ABNORMAL LOW (ref 39.0–52.0)
Hemoglobin: 10.9 g/dL — ABNORMAL LOW (ref 13.0–17.0)
MCH: 28.2 pg (ref 26.0–34.0)
MCHC: 31.8 g/dL (ref 30.0–36.0)
MCV: 88.6 fL (ref 80.0–100.0)
Platelets: 680 10*3/uL — ABNORMAL HIGH (ref 150–400)
RBC: 3.87 MIL/uL — ABNORMAL LOW (ref 4.22–5.81)
RDW: 14.9 % (ref 11.5–15.5)
WBC: 9.3 10*3/uL (ref 4.0–10.5)
nRBC: 0 % (ref 0.0–0.2)

## 2019-06-28 LAB — GLUCOSE, CAPILLARY
Glucose-Capillary: 95 mg/dL (ref 70–99)
Glucose-Capillary: 106 mg/dL — ABNORMAL HIGH (ref 70–99)
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
## 2019-06-29 ENCOUNTER — Encounter (HOSPITAL_COMMUNITY): Payer: Self-pay | Admitting: General Practice

## 2019-06-29 ENCOUNTER — Other Ambulatory Visit: Payer: Self-pay

## 2019-06-29 LAB — CBC
HCT: 34.8 % — ABNORMAL LOW (ref 39.0–52.0)
Hemoglobin: 11 g/dL — ABNORMAL LOW (ref 13.0–17.0)
MCH: 28.1 pg (ref 26.0–34.0)
MCHC: 31.6 g/dL (ref 30.0–36.0)
MCV: 89 fL (ref 80.0–100.0)
Platelets: 727 10*3/uL — ABNORMAL HIGH (ref 150–400)
RBC: 3.91 MIL/uL — ABNORMAL LOW (ref 4.22–5.81)
RDW: 14.8 % (ref 11.5–15.5)
WBC: 9.4 10*3/uL (ref 4.0–10.5)
nRBC: 0 % (ref 0.0–0.2)

## 2019-06-29 MED ORDER — MAGNESIUM CITRATE PO SOLN: 1.0000 | Freq: Once | ORAL | Status: DC | PRN

## 2019-06-29 NOTE — TOC Progression Note (Signed)
Transition of Care [facility]) - Progression Note    Patient Details  Name: Luke Choi MRN: [id number] Date of Birth: 1970-10-22  Transition of Care [facility]) CM/SW Contact  [name], RN Phone Number: 06/29/2019, 2:00 PM  Clinical Narrative:  Pt continues to await return to rehab.  Pt's fiance in process or reinstating [employer] policy with [employer].  Insurance Josem Kaufmann will be needed once reinstated.  Urban Gibson, [facility] with rehab is following authorization process.       Expected Discharge Plan: IP Rehab Facility Barriers to Discharge: Insurance Authorization  Expected Discharge Plan and Services Expected Discharge Plan: [place]     [name], RN, BSN  Trauma/Neuro ICU Case Manager [phone]
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
## 2019-06-30 LAB — CBC
HCT: 36.6 % — ABNORMAL LOW (ref 39.0–52.0)
Hemoglobin: 11.8 g/dL — ABNORMAL LOW (ref 13.0–17.0)
MCH: 28.6 pg (ref 26.0–34.0)
MCHC: 32.2 g/dL (ref 30.0–36.0)
MCV: 88.6 fL (ref 80.0–100.0)
Platelets: 697 10*3/uL — ABNORMAL HIGH (ref 150–400)
RBC: 4.13 MIL/uL — ABNORMAL LOW (ref 4.22–5.81)
RDW: 14.9 % (ref 11.5–15.5)
WBC: 9 10*3/uL (ref 4.0–10.5)
nRBC: 0 % (ref 0.0–0.2)

## 2019-06-30 MED ORDER — CLONAZEPAM 0.25 MG PO TBDP
0.2500 mg | ORAL_TABLET | Freq: Two times a day (BID) | ORAL | Status: DC
  Administered 2019-06-30 (×2): 0.25 mg via ORAL
  Filled 2019-06-30 (×2): qty 1

## 2019-06-30 MED ORDER — DOCUSATE SODIUM 100 MG PO CAPS
100.0000 mg | ORAL_CAPSULE | Freq: Two times a day (BID) | ORAL | Status: DC
  Administered 2019-06-30 – 2019-07-05 (×11): 100 mg via ORAL
  Filled 2019-06-30 (×11): qty 1
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
## 2019-07-01 LAB — CBC
HCT: 35.4 % — ABNORMAL LOW (ref 39.0–52.0)
Hemoglobin: 11.1 g/dL — ABNORMAL LOW (ref 13.0–17.0)
MCH: 28.2 pg (ref 26.0–34.0)
MCHC: 31.4 g/dL (ref 30.0–36.0)
MCV: 89.8 fL (ref 80.0–100.0)
Platelets: 687 10*3/uL — ABNORMAL HIGH (ref 150–400)
RBC: 3.94 MIL/uL — ABNORMAL LOW (ref 4.22–5.81)
RDW: 15 % (ref 11.5–15.5)
WBC: 14.4 10*3/uL — ABNORMAL HIGH (ref 4.0–10.5)
nRBC: 0 % (ref 0.0–0.2)

## 2019-07-01 MED ORDER — CLONAZEPAM 0.125 MG PO TBDP
0.1250 mg | ORAL_TABLET | Freq: Two times a day (BID) | ORAL | Status: DC
  Administered 2019-07-01 – 2019-07-02 (×3): 0.125 mg via ORAL
  Filled 2019-07-01 (×3): qty 1

## 2019-07-01 MED ORDER — METOPROLOL TARTRATE 12.5 MG HALF TABLET
12.5000 mg | ORAL_TABLET | Freq: Two times a day (BID) | ORAL | Status: DC
  Administered 2019-07-01 – 2019-07-02 (×3): 12.5 mg via ORAL
  Filled 2019-07-01 (×3): qty 1
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
## 2019-07-02 LAB — CBC
HCT: 35.8 % — ABNORMAL LOW (ref 39.0–52.0)
Hemoglobin: 11.2 g/dL — ABNORMAL LOW (ref 13.0–17.0)
MCH: 27.9 pg (ref 26.0–34.0)
MCHC: 31.3 g/dL (ref 30.0–36.0)
MCV: 89.1 fL (ref 80.0–100.0)
Platelets: 665 10*3/uL — ABNORMAL HIGH (ref 150–400)
RBC: 4.02 MIL/uL — ABNORMAL LOW (ref 4.22–5.81)
RDW: 14.9 % (ref 11.5–15.5)
WBC: 10 10*3/uL (ref 4.0–10.5)
nRBC: 0 % (ref 0.0–0.2)

## 2019-07-02 MED ORDER — METOPROLOL TARTRATE 25 MG PO TABS
25.0000 mg | ORAL_TABLET | Freq: Two times a day (BID) | ORAL | Status: DC
  Administered 2019-07-02 – 2019-07-05 (×6): 25 mg via ORAL
  Filled 2019-07-02 (×6): qty 1
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
## 2019-07-03 LAB — BASIC METABOLIC PANEL
Anion gap: 12 (ref 5–15)
BUN: 7 mg/dL (ref 6–20)
CO2: 26 mmol/L (ref 22–32)
Calcium: 9.5 mg/dL (ref 8.9–10.3)
Chloride: 98 mmol/L (ref 98–111)
Creatinine, Ser: 0.51 mg/dL — ABNORMAL LOW (ref 0.61–1.24)
GFR calc Af Amer: >60 mL/min (ref >60)
GFR calc non Af Amer: >60 mL/min (ref >60)
Glucose, Bld: 101 mg/dL — ABNORMAL HIGH (ref 70–99)
Potassium: 3.9 mmol/L (ref 3.5–5.1)
Sodium: 136 mmol/L (ref 135–145)

## 2019-07-03 LAB — CBC
HCT: 34.9 % — ABNORMAL LOW (ref 39.0–52.0)
Hemoglobin: 11.1 g/dL — ABNORMAL LOW (ref 13.0–17.0)
MCH: 28.2 pg (ref 26.0–34.0)
MCHC: 31.8 g/dL (ref 30.0–36.0)
MCV: 88.6 fL (ref 80.0–100.0)
Platelets: 648 10*3/uL — ABNORMAL HIGH (ref 150–400)
RBC: 3.94 MIL/uL — ABNORMAL LOW (ref 4.22–5.81)
RDW: 14.8 % (ref 11.5–15.5)
WBC: 9.6 10*3/uL (ref 4.0–10.5)
nRBC: 0 % (ref 0.0–0.2)

## 2019-07-03 LAB — MAGNESIUM: Magnesium: 1.8 mg/dL (ref 1.7–2.4)

## 2019-07-03 LAB — PHOSPHORUS: Phosphorus: 4.8 mg/dL — ABNORMAL HIGH (ref 2.5–4.6)

## 2019-07-03 NOTE — Progress Notes (Signed)
Pt transferred to 6N18 from 4N, oriented to room and dept.

## 2019-07-03 NOTE — Progress Notes (Signed)
Wound care done, vaseline applied to LLE wound followed by 4x4 gauze, abdominal pad and kerlix, tolerated well.
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
## 2019-07-04 LAB — MAGNESIUM: Magnesium: 1.9 mg/dL (ref 1.7–2.4)

## 2019-07-04 LAB — BASIC METABOLIC PANEL
Anion gap: 12 (ref 5–15)
BUN: 8 mg/dL (ref 6–20)
CO2: 24 mmol/L (ref 22–32)
Calcium: 9.4 mg/dL (ref 8.9–10.3)
Chloride: 99 mmol/L (ref 98–111)
Creatinine, Ser: 0.5 mg/dL — ABNORMAL LOW (ref 0.61–1.24)
GFR calc Af Amer: >60 mL/min (ref >60)
GFR calc non Af Amer: >60 mL/min (ref >60)
Glucose, Bld: 94 mg/dL (ref 70–99)
Potassium: 3.8 mmol/L (ref 3.5–5.1)
Sodium: 135 mmol/L (ref 135–145)

## 2019-07-04 LAB — CBC
HCT: 35.7 % — ABNORMAL LOW (ref 39.0–52.0)
Hemoglobin: 11.4 g/dL — ABNORMAL LOW (ref 13.0–17.0)
MCH: 28.1 pg (ref 26.0–34.0)
MCHC: 31.9 g/dL (ref 30.0–36.0)
MCV: 88.1 fL (ref 80.0–100.0)
Platelets: 632 10*3/uL — ABNORMAL HIGH (ref 150–400)
RBC: 4.05 MIL/uL — ABNORMAL LOW (ref 4.22–5.81)
RDW: 14.6 % (ref 11.5–15.5)
WBC: 9.7 10*3/uL (ref 4.0–10.5)
nRBC: 0 % (ref 0.0–0.2)

## 2019-07-04 LAB — PHOSPHORUS: Phosphorus: 5.1 mg/dL — ABNORMAL HIGH (ref 2.5–4.6)
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
## 2019-07-04 NOTE — Progress Notes (Signed)
Subjective: Resting in bedside chair watching TV.  He reports he is doing well. No complaints.  Objective: Vital signs in last 24 hours: Temp:  [98.1 F (36.7 C)-98.4 F (36.9 C)] 98.4 F (36.9 C) (11/23 0500) Pulse Rate:  [89-98] 92 (11/23 0500) Resp:  [18] 18 (11/23 0500) BP: (109-123)/(64-84) 121/84 (11/23 0500) SpO2:  [99 %] 99 % (11/23 0500) Weight:  [72 kg] 72 kg (11/22 1554) Last BM Date: 07/03/19  Intake/Output from previous day: 11/22 0701 - 11/23 0700 In: 240 [P.O.:240] Out: 2000 [Urine:2000] Intake/Output this shift: Total I/O In: 320 [P.O.:320] Out: 1000 [Urine:1000]  General appearance: alert, no distress and resting at bedside chair Head: Normocephalic, without obvious abnormality, atraumatic Resp: unlabored Extremities: LLE wrapped with kerlix, vaseline gauze. LLE incisional wound with scabbing noted. Small area of open granulation tissue. LLE inferior wound s/p Acell placement well healed, purplish color. No surrounding erythema, no swelling noted. Pulses: 2+ and symmetric   Lab Results:  CBC    Component Value Date/Time   WBC 9.7 07/04/2019 0616   RBC 4.05 (L) 07/04/2019 0616   HGB 11.4 (L) 07/04/2019 0616   HCT 35.7 (L) 07/04/2019 0616   PLT 632 (H) 07/04/2019 0616   MCV 88.1 07/04/2019 0616   MCH 28.1 07/04/2019 0616   MCHC 31.9 07/04/2019 0616   RDW 14.6 07/04/2019 0616   LYMPHSABS 1.3 06/10/2019 0503   MONOABS 1.2 (H) 06/10/2019 0503   EOSABS 0.3 06/10/2019 0503   BASOSABS 0.1 06/10/2019 0503    BMET Recent Labs    07/03/19 0544 07/04/19 0616  NA 136 135  K 3.9 3.8  CL 98 99  CO2 26 24  GLUCOSE 101* 94  BUN 7 8  CREATININE 0.51* 0.50*  CALCIUM 9.5 9.4   PT/INR No results for input(s): LABPROT, INR in the last 72 hours. ABG No results for input(s): PHART, HCO3 in the last 72 hours.  Invalid input(s): PCO2, PO2  Studies/Results: No results found.  Anti-infectives: Anti-infectives (From admission, onward)   Start      Dose/Rate Route Frequency Ordered Stop   06/21/19 2200  nitrofurantoin (macrocrystal-monohydrate) (MACROBID) capsule 100 mg  Status:  Discontinued     100 mg Oral Every 12 hours 06/21/19 1552 07/02/19 1434   06/20/19 2000  ceFAZolin (ANCEF) IVPB 1 g/50 mL premix     1 g 100 mL/hr over 30 Minutes Intravenous  Once 06/20/19 1259 06/20/19 2058      Assessment/Plan: LLE wound  Continue with daily dressing changes. Vaseline ointment to the LLE incision, use 4x4, abd, kerlix, medipore tape.   Call with questions or concerns.   Pictures were obtained of the patient and placed in the chart with the patient's or guardian's permission.    LOS: 24 days    Charlies Constable, PA-C 07/04/2019

## 2019-07-04 NOTE — Plan of Care (Signed)
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
## 2019-07-04 NOTE — Progress Notes (Signed)
Dressing changed as ordered. Pt tolerated well. Will continue to monitor.  

## 2019-07-05 ENCOUNTER — Inpatient Hospital Stay (HOSPITAL_COMMUNITY)
Admission: RE | Admit: 2019-07-05 | Discharge: 2019-07-13 | DRG: 945 | Disposition: A | Discharge status: Home Health | Payer: BC Managed Care – PPO | Source: Intra-hospital | Attending: Physical Medicine & Rehabilitation | Admitting: Physical Medicine & Rehabilitation

## 2019-07-05 ENCOUNTER — Other Ambulatory Visit: Payer: Self-pay

## 2019-07-05 ENCOUNTER — Encounter (HOSPITAL_COMMUNITY): Payer: Self-pay

## 2019-07-05 DIAGNOSIS — I82451 Acute embolism and thrombosis of right peroneal vein: Secondary | ICD-10-CM | POA: Diagnosis present

## 2019-07-05 DIAGNOSIS — S8251XS Displaced fracture of medial malleolus of right tibia, sequela: Secondary | ICD-10-CM

## 2019-07-05 DIAGNOSIS — I10 Essential (primary) hypertension: Secondary | ICD-10-CM | POA: Diagnosis present

## 2019-07-05 DIAGNOSIS — S72111D Displaced fracture of greater trochanter of right femur, subsequent encounter for closed fracture with routine healing: Secondary | ICD-10-CM

## 2019-07-05 DIAGNOSIS — I82411 Acute embolism and thrombosis of right femoral vein: Secondary | ICD-10-CM | POA: Diagnosis present

## 2019-07-05 DIAGNOSIS — Z91030 Bee allergy status: Secondary | ICD-10-CM | POA: Diagnosis not present

## 2019-07-05 DIAGNOSIS — S8251XD Displaced fracture of medial malleolus of right tibia, subsequent encounter for closed fracture with routine healing: Secondary | ICD-10-CM | POA: Diagnosis not present

## 2019-07-05 DIAGNOSIS — R000 Tachycardia, unspecified: Secondary | ICD-10-CM | POA: Diagnosis present

## 2019-07-05 DIAGNOSIS — S069X2D Unspecified intracranial injury with loss of consciousness of 31 minutes to 59 minutes, subsequent encounter: Secondary | ICD-10-CM | POA: Diagnosis not present

## 2019-07-05 DIAGNOSIS — S72114S Nondisplaced fracture of greater trochanter of right femur, sequela: Secondary | ICD-10-CM

## 2019-07-05 DIAGNOSIS — S2243XD Multiple fractures of ribs, bilateral, subsequent encounter for fracture with routine healing: Secondary | ICD-10-CM

## 2019-07-05 DIAGNOSIS — G44309 Post-traumatic headache, unspecified, not intractable: Secondary | ICD-10-CM | POA: Diagnosis not present

## 2019-07-05 DIAGNOSIS — D62 Acute posthemorrhagic anemia: Secondary | ICD-10-CM | POA: Diagnosis present

## 2019-07-05 DIAGNOSIS — S270XXD Traumatic pneumothorax, subsequent encounter: Secondary | ICD-10-CM | POA: Diagnosis not present

## 2019-07-05 DIAGNOSIS — Z7409 Other reduced mobility: Secondary | ICD-10-CM | POA: Diagnosis not present

## 2019-07-05 DIAGNOSIS — S069X3S Unspecified intracranial injury with loss of consciousness of 1 hour to 5 hours 59 minutes, sequela: Secondary | ICD-10-CM | POA: Diagnosis not present

## 2019-07-05 DIAGNOSIS — I82441 Acute embolism and thrombosis of right tibial vein: Secondary | ICD-10-CM | POA: Diagnosis present

## 2019-07-05 DIAGNOSIS — F1010 Alcohol abuse, uncomplicated: Secondary | ICD-10-CM | POA: Diagnosis present

## 2019-07-05 DIAGNOSIS — I82431 Acute embolism and thrombosis of right popliteal vein: Secondary | ICD-10-CM | POA: Diagnosis present

## 2019-07-05 DIAGNOSIS — S066X9D Traumatic subarachnoid hemorrhage with loss of consciousness of unspecified duration, subsequent encounter: Secondary | ICD-10-CM | POA: Diagnosis not present

## 2019-07-05 DIAGNOSIS — S069X2S Unspecified intracranial injury with loss of consciousness of 31 minutes to 59 minutes, sequela: Secondary | ICD-10-CM | POA: Diagnosis not present

## 2019-07-05 DIAGNOSIS — S069XAA Unspecified intracranial injury with loss of consciousness status unknown, initial encounter: Secondary | ICD-10-CM | POA: Diagnosis present

## 2019-07-05 DIAGNOSIS — S42101D Fracture of unspecified part of scapula, right shoulder, subsequent encounter for fracture with routine healing: Secondary | ICD-10-CM | POA: Diagnosis not present

## 2019-07-05 DIAGNOSIS — S069X9A Unspecified intracranial injury with loss of consciousness of unspecified duration, initial encounter: Secondary | ICD-10-CM | POA: Diagnosis present

## 2019-07-05 DIAGNOSIS — G4700 Insomnia, unspecified: Secondary | ICD-10-CM | POA: Diagnosis present

## 2019-07-05 DIAGNOSIS — S069X9S Unspecified intracranial injury with loss of consciousness of unspecified duration, sequela: Secondary | ICD-10-CM

## 2019-07-05 DIAGNOSIS — R1310 Dysphagia, unspecified: Secondary | ICD-10-CM | POA: Diagnosis present

## 2019-07-05 DIAGNOSIS — Z930 Tracheostomy status: Secondary | ICD-10-CM | POA: Diagnosis not present

## 2019-07-05 DIAGNOSIS — J939 Pneumothorax, unspecified: Secondary | ICD-10-CM | POA: Diagnosis not present

## 2019-07-05 DIAGNOSIS — G44319 Acute post-traumatic headache, not intractable: Secondary | ICD-10-CM | POA: Diagnosis not present

## 2019-07-05 DIAGNOSIS — Z789 Other specified health status: Secondary | ICD-10-CM | POA: Diagnosis not present

## 2019-07-05 LAB — BASIC METABOLIC PANEL
Anion gap: 11 (ref 5–15)
BUN: 10 mg/dL (ref 6–20)
CO2: 23 mmol/L (ref 22–32)
Calcium: 9.4 mg/dL (ref 8.9–10.3)
Chloride: 100 mmol/L (ref 98–111)
Creatinine, Ser: 0.45 mg/dL — ABNORMAL LOW (ref 0.61–1.24)
Glucose, Bld: 99 mg/dL (ref 70–99)
Potassium: 4.1 mmol/L (ref 3.5–5.1)
Sodium: 134 mmol/L — ABNORMAL LOW (ref 135–145)

## 2019-07-05 LAB — CBC
HCT: 35.6 % — ABNORMAL LOW (ref 39.0–52.0)
Hemoglobin: 11.4 g/dL — ABNORMAL LOW (ref 13.0–17.0)
MCH: 27.9 pg (ref 26.0–34.0)
MCHC: 32 g/dL (ref 30.0–36.0)
MCV: 87 fL (ref 80.0–100.0)
Platelets: 370 10*3/uL (ref 150–400)
RBC: 4.09 MIL/uL — ABNORMAL LOW (ref 4.22–5.81)
RDW: 14.6 % (ref 11.5–15.5)
WBC: 7.6 10*3/uL (ref 4.0–10.5)
nRBC: 0 % (ref 0.0–0.2)

## 2019-07-05 LAB — PHOSPHORUS: Phosphorus: 5.1 mg/dL — ABNORMAL HIGH (ref 2.5–4.6)

## 2019-07-05 LAB — MAGNESIUM: Magnesium: 1.8 mg/dL (ref 1.7–2.4)

## 2019-07-05 MED ORDER — TRAMADOL HCL 50 MG PO TABS
50.0000 mg | ORAL_TABLET | Freq: Two times a day (BID) | ORAL | Status: DC
  Administered 2019-07-05 – 2019-07-13 (×16): 50 mg via ORAL
  Filled 2019-07-05 (×16): qty 1

## 2019-07-05 MED ORDER — ENSURE ENLIVE PO LIQD
237.0000 mL | Freq: Two times a day (BID) | ORAL | Status: DC
  Administered 2019-07-06 – 2019-07-13 (×15): 237 mL via ORAL

## 2019-07-05 MED ORDER — SENNOSIDES-DOCUSATE SODIUM 8.6-50 MG PO TABS
1.0000 | ORAL_TABLET | Freq: Every day | ORAL | Status: DC
  Administered 2019-07-05 – 2019-07-12 (×8): 1 via ORAL
  Filled 2019-07-05 (×8): qty 1

## 2019-07-05 MED ORDER — PRO-STAT SUGAR FREE PO LIQD
30.0000 mL | Freq: Two times a day (BID) | ORAL | Status: DC
  Administered 2019-07-05 – 2019-07-13 (×16): 30 mL via ORAL
  Filled 2019-07-05 (×16): qty 30

## 2019-07-05 MED ORDER — METOPROLOL TARTRATE 25 MG PO TABS
25.0000 mg | ORAL_TABLET | Freq: Two times a day (BID) | ORAL | Status: DC
  Administered 2019-07-05 – 2019-07-09 (×8): 25 mg via ORAL
  Filled 2019-07-05 (×8): qty 1

## 2019-07-05 MED ORDER — APIXABAN 5 MG PO TABS
5.0000 mg | ORAL_TABLET | Freq: Two times a day (BID) | ORAL | Status: DC
  Administered 2019-07-05 – 2019-07-13 (×16): 5 mg via ORAL
  Filled 2019-07-05 (×16): qty 1

## 2019-07-05 MED ORDER — OXYCODONE HCL 5 MG/5ML PO SOLN
5.0000 mg | ORAL | Status: DC | PRN
  Administered 2019-07-07: 11:00:00 5 mg
  Administered 2019-07-08 – 2019-07-09 (×3): 10 mg
  Filled 2019-07-05 (×2): qty 10
  Filled 2019-07-05: qty 5
  Filled 2019-07-05: qty 10

## 2019-07-05 MED ORDER — LORAZEPAM 0.5 MG PO TABS
0.5000 mg | ORAL_TABLET | Freq: Four times a day (QID) | ORAL | Status: DC | PRN
  Administered 2019-07-05 – 2019-07-11 (×6): 0.5 mg via ORAL
  Filled 2019-07-05 (×6): qty 1

## 2019-07-05 MED ORDER — QUETIAPINE FUMARATE 50 MG PO TABS
100.0000 mg | ORAL_TABLET | Freq: Every day | ORAL | Status: DC
  Administered 2019-07-05 – 2019-07-12 (×8): 100 mg via ORAL
  Filled 2019-07-05 (×8): qty 2

## 2019-07-05 MED ORDER — DOCUSATE SODIUM 100 MG PO CAPS
100.0000 mg | ORAL_CAPSULE | Freq: Two times a day (BID) | ORAL | Status: DC
  Administered 2019-07-05 – 2019-07-13 (×16): 100 mg via ORAL
  Filled 2019-07-05 (×16): qty 1

## 2019-07-05 MED ORDER — SORBITOL 70 % SOLN
30.0000 mL | Freq: Every day | Status: DC | PRN
  Filled 2019-07-05: qty 30
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
## 2019-07-05 NOTE — Progress Notes (Signed)
Inpatient Rehab Admissions Coordinator:   I was able to open insurance for prior authorization today.  Will follow for possible admission today or tomorrow pending insurance approval.    Shann Medal, PT, DPT Admissions Coordinator [phone] 07/05/19  10:37 AM

## 2019-07-05 NOTE — Progress Notes (Signed)
Transitions of Care Pharmacist Note  Luke Choi is a 48 y.o. male that has been diagnosed with DVT and will be prescribed Eliquis (apixaban) at discharge.   Patient Education: I provided the following education on 07/05/2019 to the patient: How to take the medication Described what the medication is Signs of bleeding Signs/symptoms of VTE and stroke  Answered their questions  Discharge Medications Plan: The patient wants to have their discharge medications filled by the Transitions of Care pharmacy rather than their usual pharmacy.  The discharge orders pharmacy has been changed to the Transitions of Care pharmacy, the patient will receive a phone call regarding co-pay, and their medications will be delivered by the Transitions of Care pharmacy.    Thank you,   Berenice Bouton, PharmD PGY1 Pharmacy Resident Office phone: [phone] July 05, 2019
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
## 2019-07-05 NOTE — Progress Notes (Signed)
Report called to [facility]. Will transfer patient via wheelchair.
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
## 2019-07-05 NOTE — Progress Notes (Signed)
Admitted patient to unit. Medications, plan of care, and schedule reviewed with patient.

## 2019-07-06 ENCOUNTER — Inpatient Hospital Stay (HOSPITAL_COMMUNITY): Payer: Self-pay | Admitting: Physical Therapy

## 2019-07-06 ENCOUNTER — Inpatient Hospital Stay (HOSPITAL_COMMUNITY): Payer: Self-pay

## 2019-07-06 ENCOUNTER — Inpatient Hospital Stay (HOSPITAL_COMMUNITY): Payer: Self-pay | Admitting: Speech Pathology

## 2019-07-06 DIAGNOSIS — S069X3S Unspecified intracranial injury with loss of consciousness of 1 hour to 5 hours 59 minutes, sequela: Secondary | ICD-10-CM

## 2019-07-06 DIAGNOSIS — Z789 Other specified health status: Secondary | ICD-10-CM

## 2019-07-06 DIAGNOSIS — Z7409 Other reduced mobility: Secondary | ICD-10-CM

## 2019-07-06 LAB — COMPREHENSIVE METABOLIC PANEL
ALT: 31 U/L (ref 0–44)
AST: 20 U/L (ref 15–41)
Albumin: 2.9 g/dL — ABNORMAL LOW (ref 3.5–5.0)
Alkaline Phosphatase: 120 U/L (ref 38–126)
Anion gap: 12 (ref 5–15)
BUN: 9 mg/dL (ref 6–20)
CO2: 25 mmol/L (ref 22–32)
Calcium: 9.6 mg/dL (ref 8.9–10.3)
Chloride: 98 mmol/L (ref 98–111)
Creatinine, Ser: 0.46 mg/dL — ABNORMAL LOW (ref 0.61–1.24)
GFR calc Af Amer: >60 mL/min (ref >60)
GFR calc non Af Amer: >60 mL/min (ref >60)
Glucose, Bld: 94 mg/dL (ref 70–99)
Potassium: 3.7 mmol/L (ref 3.5–5.1)
Sodium: 135 mmol/L (ref 135–145)
Total Bilirubin: 0.4 mg/dL (ref 0.3–1.2)
Total Protein: 6.9 g/dL (ref 6.5–8.1)

## 2019-07-06 LAB — CBC WITH DIFFERENTIAL/PLATELET
Abs Immature Granulocytes: 0.05 10*3/uL (ref 0.00–0.07)
Basophils Absolute: 0.1 10*3/uL (ref 0.0–0.1)
Basophils Relative: 1 %
Eosinophils Absolute: 0.3 10*3/uL (ref 0.0–0.5)
Eosinophils Relative: 4 %
HCT: 36.5 % — ABNORMAL LOW (ref 39.0–52.0)
Hemoglobin: 11.6 g/dL — ABNORMAL LOW (ref 13.0–17.0)
Immature Granulocytes: 1 %
Lymphocytes Relative: 19 %
Lymphs Abs: 1.5 10*3/uL (ref 0.7–4.0)
MCH: 28 pg (ref 26.0–34.0)
MCHC: 31.8 g/dL (ref 30.0–36.0)
MCV: 88 fL (ref 80.0–100.0)
Monocytes Absolute: 0.9 10*3/uL (ref 0.1–1.0)
Monocytes Relative: 11 %
Neutro Abs: 5.3 10*3/uL (ref 1.7–7.7)
Neutrophils Relative %: 64 %
Platelets: 553 10*3/uL — ABNORMAL HIGH (ref 150–400)
RBC: 4.15 MIL/uL — ABNORMAL LOW (ref 4.22–5.81)
RDW: 14.5 % (ref 11.5–15.5)
WBC: 8.1 10*3/uL (ref 4.0–10.5)
nRBC: 0 % (ref 0.0–0.2)

## 2019-07-06 MED ORDER — MELATONIN 3 MG PO TABS
6.0000 mg | ORAL_TABLET | Freq: Every evening | ORAL | Status: DC | PRN
  Administered 2019-07-06 – 2019-07-12 (×7): 6 mg via ORAL
  Filled 2019-07-06 (×9): qty 2

## 2019-07-06 NOTE — Progress Notes (Signed)
Patient information reviewed and entered into [facility] System by Becky Sophonie Goforth, PPS coordinator. Information including medical coding, function ability, and quality indicators will be reviewed and updated through discharge.   
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
## 2019-07-06 NOTE — Plan of Care (Signed)
  Problem: Consults Goal: RH GENERAL PATIENT EDUCATION Description: See Patient Education module for education specifics. Outcome: Progressing Goal: Skin Care Protocol Initiated - if Braden Score 18 or less Description: If consults are not indicated, leave blank or document N/A Outcome: Progressing   Problem: RH BOWEL ELIMINATION Goal: RH STG MANAGE BOWEL WITH ASSISTANCE Description: STG Manage Bowel with Minimal Assistance. Outcome: Progressing   Problem: RH BLADDER ELIMINATION Goal: RH STG MANAGE BLADDER WITH ASSISTANCE Description: STG Manage Bladder With Minimal Assistance Outcome: Progressing   Problem: RH SKIN INTEGRITY Goal: RH STG SKIN FREE OF INFECTION/BREAKDOWN Description: STG remain free of infection/breakdown Outcome: Progressing Goal: RH STG MAINTAIN SKIN INTEGRITY WITH ASSISTANCE Description: STG Maintain Skin Integrity With Minimal Assistance. Outcome: Progressing Goal: RH STG ABLE TO PERFORM INCISION/WOUND CARE W/ASSISTANCE Description: STG Able To Perform Incision/Wound Care With Minimal Assistance. Outcome: Progressing   Problem: RH SAFETY Goal: RH STG ADHERE TO SAFETY PRECAUTIONS W/ASSISTANCE/DEVICE Description: STG Adhere to Safety Precautions With Minimal Assistance/Device. Outcome: Progressing Goal: RH STG DECREASED RISK OF FALL WITH ASSISTANCE Description: STG Decreased Risk of Fall With Minimal Assistance. Outcome: Progressing   Problem: RH KNOWLEDGE DEFICIT GENERAL Goal: RH STG INCREASE KNOWLEDGE OF SELF CARE AFTER HOSPITALIZATION Outcome: Progressing Note: STG Become knowledgeable of self care technique with min assist
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
## 2019-07-06 NOTE — Plan of Care (Signed)
  Problem: Consults Goal: RH GENERAL PATIENT EDUCATION Description: See Patient Education module for education specifics. Outcome: Progressing Goal: Skin Care Protocol Initiated - if Braden Score 18 or less Description: If consults are not indicated, leave blank or document N/A Outcome: Progressing   Problem: RH BOWEL ELIMINATION Goal: RH STG MANAGE BOWEL WITH ASSISTANCE Description: STG Manage Bowel with Minimal Assistance. Outcome: Progressing   Problem: RH BLADDER ELIMINATION Goal: RH STG MANAGE BLADDER WITH ASSISTANCE Description: STG Manage Bladder With Minimal Assistance Outcome: Progressing   Problem: RH SKIN INTEGRITY Goal: RH STG SKIN FREE OF INFECTION/BREAKDOWN Description: STG remain free of infection/breakdown Outcome: Progressing Goal: RH STG MAINTAIN SKIN INTEGRITY WITH ASSISTANCE Description: STG Maintain Skin Integrity With Minimal Assistance. Outcome: Progressing Goal: RH STG ABLE TO PERFORM INCISION/WOUND CARE W/ASSISTANCE Description: STG Able To Perform Incision/Wound Care With Minimal Assistance. Outcome: Progressing   Problem: RH SAFETY Goal: RH STG ADHERE TO SAFETY PRECAUTIONS W/ASSISTANCE/DEVICE Description: STG Adhere to Safety Precautions With Minimal Assistance/Device. Outcome: Progressing Goal: RH STG DECREASED RISK OF FALL WITH ASSISTANCE Description: STG Decreased Risk of Fall With Minimal Assistance. Outcome: Progressing   Problem: RH KNOWLEDGE DEFICIT GENERAL Goal: RH STG INCREASE KNOWLEDGE OF SELF CARE AFTER HOSPITALIZATION Outcome: Progressing   
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
## 2019-07-07 ENCOUNTER — Inpatient Hospital Stay (HOSPITAL_COMMUNITY): Payer: Self-pay

## 2019-07-07 MED ORDER — TOPIRAMATE 25 MG PO TABS
25.0000 mg | ORAL_TABLET | Freq: Every day | ORAL | Status: DC
  Administered 2019-07-07 – 2019-07-13 (×7): 25 mg via ORAL
  Filled 2019-07-07 (×7): qty 1
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
## 2019-07-07 NOTE — Plan of Care (Signed)
  Problem: Consults Goal: RH GENERAL PATIENT EDUCATION Description: See Patient Education module for education specifics. Outcome: Progressing Goal: Skin Care Protocol Initiated - if Braden Score 18 or less Description: If consults are not indicated, leave blank or document N/A Outcome: Progressing   Problem: RH BOWEL ELIMINATION Goal: RH STG MANAGE BOWEL WITH ASSISTANCE Description: STG Manage Bowel with Minimal Assistance. Outcome: Progressing   Problem: RH BLADDER ELIMINATION Goal: RH STG MANAGE BLADDER WITH ASSISTANCE Description: STG Manage Bladder With Minimal Assistance Outcome: Progressing   Problem: RH SKIN INTEGRITY Goal: RH STG SKIN FREE OF INFECTION/BREAKDOWN Description: STG remain free of infection/breakdown Outcome: Progressing Goal: RH STG MAINTAIN SKIN INTEGRITY WITH ASSISTANCE Description: STG Maintain Skin Integrity With Minimal Assistance. Outcome: Progressing Goal: RH STG ABLE TO PERFORM INCISION/WOUND CARE W/ASSISTANCE Description: STG Able To Perform Incision/Wound Care With Minimal Assistance. Outcome: Progressing   Problem: RH SAFETY Goal: RH STG ADHERE TO SAFETY PRECAUTIONS W/ASSISTANCE/DEVICE Description: STG Adhere to Safety Precautions With Minimal Assistance/Device. Outcome: Progressing Goal: RH STG DECREASED RISK OF FALL WITH ASSISTANCE Description: STG Decreased Risk of Fall With Minimal Assistance. Outcome: Progressing   Problem: RH KNOWLEDGE DEFICIT GENERAL Goal: RH STG INCREASE KNOWLEDGE OF SELF CARE AFTER HOSPITALIZATION Outcome: Progressing

## 2019-07-08 ENCOUNTER — Inpatient Hospital Stay (HOSPITAL_COMMUNITY): Payer: Self-pay

## 2019-07-08 ENCOUNTER — Inpatient Hospital Stay (HOSPITAL_COMMUNITY): Payer: Self-pay | Admitting: Physical Therapy

## 2019-07-08 NOTE — Care Management (Signed)
[facility] Individual Statement of Services  Patient Name:  Luke Choi  Date:  07/08/2019  Welcome to the [facility].  Our goal is to provide you with an individualized program based on your diagnosis and situation, designed to meet your specific needs.  With this comprehensive rehabilitation program, you will be expected to participate in at least 3 hours of rehabilitation therapies Monday-Friday, with modified therapy programming on the weekends.  Your rehabilitation program will include the following services:  Physical Therapy (PT), Occupational Therapy (OT), Speech Therapy (ST), 24 hour per day rehabilitation nursing, Therapeutic Recreaction (TR), Neuropsychology, Case Management (Social Worker), Rehabilitation Medicine, Nutrition Services and Pharmacy Services  Weekly team conferences will be held on Tuesdays to discuss your progress.  Your Social Worker will talk with you frequently to get your input and to update you on team discussions.  Team conferences with you and your family in attendance may also be held.  Expected length of stay: 7-10 days  Overall anticipated outcome: supervision  Depending on your progress and recovery, your program may change. Your Social Worker will coordinate services and will keep you informed of any changes. Your Social Worker's name and contact numbers are listed  below.  The following services may also be recommended but are not provided by the [facility] will be made to provide these services after discharge if needed.  Arrangements include referral to agencies that provide these services.  Your insurance has been verified to be:  [employer] [place] primary doctor is:  Gentry Fitz  Pertinent information will be shared with your doctor and your  insurance company.  Social Worker:  [place], [place] or ([phone]   Information discussed with and copy given to patient by: Lennart Pall, 07/08/2019, 10:23 AM

## 2019-07-08 NOTE — Progress Notes (Signed)
Occupational Therapy Session Note  Patient Details  Name: Luke Choi MRN: [id number] Date of Birth: [birth year]  Today's Date: 07/08/2019 OT Individual Time: 6244-6950 OT Individual Time Calculation (min): 57 min    Short Term Goals: Week 1:  OT Short Term Goal 1 (Week 1): STG=LTG d/t ELOS  Skilled Therapeutic Interventions/Progress Updates:    1:1. Pt received in bed agreeable to tx with no reporting of dizziness, fatigue or pain. Pt completes ambulatory transfer to w/c set out in hallway with walker and VC for scooting to EOB prior to sit to stand. HR 104. Pt completes UB therex in outside [place] for mood improvement and BUE strengthening with 2# DB for shoulder flex/ext, ab/adduct, wlbow fex.ext, chest press, and int/ext rotation with demo cues for technique. Pt completes functional mobility over uneven concrete surfaces to simulate community mobility with RW 2x25' with CGA. HR 111 after mobility. Exited session with tp seated in bed, exit alarm on, call light in reach and all needs met  Therapy Documentation Precautions:  Precautions Precautions: Fall Precaution Comments: L foot drop Required Braces or Orthoses: Other Brace Other Brace: PRAFO left in bed Restrictions Weight Bearing Restrictions: Yes RUE Weight Bearing: Weight bearing as tolerated RLE Weight Bearing: Weight bearing as tolerated LLE Weight Bearing: Weight bearing as tolerated Other Position/Activity Restrictions: Dr Erlinda Hong recently discontinued use of R CAM boot, s/p medial malleolus ORIF General:   Vital Signs:   Pain:   ADL: ADL Eating: Independent Where Assessed-Eating: Bed level Grooming: Supervision/safety Where Assessed-Grooming: Sitting at sink Upper Body Bathing: Supervision/safety Where Assessed-Upper Body Bathing: Shower Lower Body Bathing: Minimal assistance Where Assessed-Lower Body Bathing: Shower Upper Body Dressing: Minimal assistance Where Assessed-Upper Body Dressing: Sitting at  sink Lower Body Dressing: Minimal assistance Where Assessed-Lower Body Dressing: Sitting at sink Toileting: Minimal assistance Where Assessed-Toileting: Glass blower/designer: Psychiatric nurse Method: Magazine features editor: Administrator, Civil Service    Praxis   Exercises:   Other Treatments:     Therapy/Group: Individual Therapy  Tonny Branch 07/08/2019, 2:58 PM
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
## 2019-07-08 NOTE — IPOC Note (Signed)
Overall Plan of Care [facility]) Patient Details Name: Luke Choi MRN: [id number] DOB: May 14, 1971  Admitting Diagnosis: TBI (traumatic brain injury) [facility])  Hospital Problems: Principal Problem:   TBI (traumatic brain injury) ([place])     Functional Problem List: Nursing Endurance, Medication Management, Safety  PT Balance, Endurance, Motor, Safety, Skin Integrity, Sensory  OT Balance, Endurance, Pain, Safety, Sensory  SLP    TR         Basic ADL's: OT Bathing, Dressing, Toileting     Advanced  ADL's: OT       Transfers: PT Bed Mobility, Floor, Bed to Chair, Car, Manufacturing systems engineer, Metallurgist: PT Stairs, Ambulation     Additional Impairments: OT None  SLP        TR      Anticipated Outcomes Item Anticipated Outcome  Self Feeding no goal set  Swallowing      Basic self-care  mod I  Toileting  (S)   Bathroom Transfers (S)  Bowel/Bladder  n/a  Transfers  supervision  Locomotion  supervision household gait w/ LRAD  Communication     Cognition     Pain  n/a  Safety/Judgment  Cues and reminders   Therapy Plan: PT Intensity: Minimum of 1-2 x/day ,45 to 90 minutes PT Frequency: 5 out of 7 days PT Duration Estimated Length of Stay: 7-10 days OT Intensity: Minimum of 1-2 x/day, 45 to 90 minutes OT Frequency: 5 out of 7 days OT Duration/Estimated Length of Stay: 7-10 days     Due to the current state of emergency, patients may not be receiving their 3-hours of Medicare-mandated therapy.   Team Interventions: Nursing Interventions Patient/Family Education, Medication Management, Discharge Planning, Skin Care/Wound Management  PT interventions Ambulation/gait training, Discharge planning, DME/adaptive equipment instruction, Functional mobility training, Pain management, Psychosocial support, Splinting/orthotics, Therapeutic Activities, UE/LE Strength taining/ROM, UE/LE Coordination activities, Therapeutic Exercise, Stair training, Skin  care/wound management, Patient/family education, Neuromuscular re-education, Functional electrical stimulation, Disease management/prevention, Academic librarian, Training and development officer  OT Interventions Training and development officer, Community reintegration, Disease mangement/prevention, Barrister's clerk education, Self Care/advanced ADL retraining, Therapeutic Exercise, UE/LE Coordination activities, Wheelchair propulsion/positioning, Discharge planning, DME/adaptive equipment instruction, Functional mobility training, Pain management, Psychosocial support, Skin care/wound managment, Therapeutic Activities, UE/LE Strength taining/ROM  SLP Interventions    TR Interventions    SW/CM Interventions Discharge Planning, Psychosocial Support, Patient/Family Education   Barriers to Discharge MD  Medical stability  Nursing      PT Medical stability, Wound Care    OT      SLP      SW       Team Discharge Planning: Destination: PT-Home ,OT- Home , SLP-Home Projected Follow-up: PT-Home health PT, OT-  Home health OT, SLP-None Projected Equipment Needs: PT-To be determined, OT- Tub/shower bench, SLP-None recommended by SLP Equipment Details: PT- , OT-  Patient/family involved in discharge planning: PT- Patient,  OT-Patient, SLP-Patient, Family member/caregiver  MD ELOS: 7-10 days Medical Rehab Prognosis:  Excellent Assessment: The patient has been admitted for CIR therapies with the diagnosis of TBI with polytrauma. The team will be addressing functional mobility, strength, stamina, balance, safety, adaptive techniques and equipment, self-care, bowel and bladder mgt, patient and caregiver education, pain mgt, NMR, wound care, community reentry. Goals have been set at mod I to supervision.   Due to the current state of emergency, patients may not be receiving their 3 hours per day of Medicare-mandated therapy.    [name], MD, Mellody Drown  See Team Conference Notes for weekly  updates to the plan of care
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
## 2019-07-08 NOTE — Progress Notes (Signed)
Orthopedic Tech Progress Note Patient Details:  Luke Choi 08/28/1970 [id number] Called in order to [employer] for an AFO Patient ID: Luke Choi, male   DOB: [birth year], 48 y.o.   MRN: [id number]   [name] 07/08/2019, 8:53 AM

## 2019-07-08 NOTE — Plan of Care (Signed)
Behavioral Plan     [place] Level:8  Behavior to decrease/ eliminate: follow General safety precautions   Changes to environment:  Trial of chair alarm  Urinal at bedside  Interventions: Education on mild BI education ongoing with girlfriend   Recommendations for interactions with patient: Patient with mild memory deficits, encouragement of recall and use of external memory aids   Attendees: Clovia Cuff OT, [place], and Mariane Masters OT

## 2019-07-08 NOTE — Progress Notes (Signed)
Occupational Therapy Session Note  Patient Details  Name: Wilmon Conover MRN: [id number] Date of Birth: 08-26-1970  Today's Date: 07/08/2019 OT Individual Time: 1030-1145 OT Individual Time Calculation (min): 75 min    Short Term Goals: Week 1:  OT Short Term Goal 1 (Week 1): STG=LTG d/t ELOS  Skilled Therapeutic Interventions/Progress Updates:     1:1. Pain not reported during session. Pt agreeable to bathing and dressing. D/t elevated HR in previous session vitals monitored closely and recorded below. Pt completes MIN A squat pivo ttransfer to w/c from EOB. Grooming with set up seated at sink. Pt bathes UB at sink with set up and dresses with S. Pt washes LB with CGA for standing balance to wash peri area and buttocks. Pt dons LB clothing with MIN A for standing balance. Footwear donned with min A to don L shoe d/t toes curling. Pt able to don B socks with stool method but also educated on sock aide which is pt preference. Pt educated on 1UE support on sink or walker during dressing d/t balance deficits. Pt ambulates with RW to stand over toilet with RW. Pt completes alternating attention activity with graded peg board, graded pipe tree activity practicing turning with RW in between steps. Exited session with pt seated in recliner, call light in reach, exit alamr on and all need smet  BP suping HOB elevated: 99/77 HR 84 O2 100% HR after bathing seated: 98 After dressing: 100 After toileting:101 After alternating attention activity in standing 113 (returned to <100 in 60 sec seated) Therapy Documentation Precautions:  Precautions Precautions: Fall Precaution Comments: L foot drop Required Braces or Orthoses: Other Brace Other Brace: PRAFO left in bed Restrictions Weight Bearing Restrictions: Yes RUE Weight Bearing: Weight bearing as tolerated RLE Weight Bearing: Weight bearing as tolerated LLE Weight Bearing: Weight bearing as tolerated Other Position/Activity Restrictions: Dr Erlinda Hong  recently discontinued use of R CAM boot, s/p medial malleolus ORIF General:   Vital Signs: Therapy Vitals Temp: 98 F (36.7 C) Temp Source: Oral Pulse Rate: (!) 102 Resp: 18 BP: 119/83 Oxygen Therapy SpO2: 98 % Pain:   ADL: ADL Eating: Independent Where Assessed-Eating: Bed level Grooming: Supervision/safety Where Assessed-Grooming: Sitting at sink Upper Body Bathing: Supervision/safety Where Assessed-Upper Body Bathing: Shower Lower Body Bathing: Minimal assistance Where Assessed-Lower Body Bathing: Shower Upper Body Dressing: Minimal assistance Where Assessed-Upper Body Dressing: Sitting at sink Lower Body Dressing: Minimal assistance Where Assessed-Lower Body Dressing: Sitting at sink Toileting: Minimal assistance Where Assessed-Toileting: Glass blower/designer: Psychiatric nurse Method: Magazine features editor: Administrator, Civil Service    Praxis   Exercises:   Other Treatments:     Therapy/Group: Individual Therapy  Tonny Branch 07/08/2019, 7:00 AM
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
## 2019-07-09 ENCOUNTER — Inpatient Hospital Stay (HOSPITAL_COMMUNITY): Payer: Self-pay

## 2019-07-09 ENCOUNTER — Inpatient Hospital Stay (HOSPITAL_COMMUNITY): Payer: Self-pay | Admitting: Physical Therapy

## 2019-07-09 MED ORDER — METOPROLOL TARTRATE 25 MG PO TABS
37.5000 mg | ORAL_TABLET | Freq: Two times a day (BID) | ORAL | Status: DC
  Administered 2019-07-09 – 2019-07-13 (×8): 37.5 mg via ORAL
  Filled 2019-07-09 (×8): qty 1

## 2019-07-09 NOTE — Progress Notes (Signed)
Occupational Therapy Session Note  Patient Details  Name: Luke Choi MRN: [id number] Date of Birth: 1970/08/29  Today's Date: 07/09/2019 OT Individual Time: 5597-4163 OT Individual Time Calculation (min): 55 min    Short Term Goals: Week 1:  OT Short Term Goal 1 (Week 1): STG=LTG d/t ELOS  Skilled Therapeutic Interventions/Progress Updates:    1:1. Pt received in bed with soreness in shoulders from therex yesterday but no pain. Pt with HR 104 supine at beginning of session. Pt completes stand pivot transfer with MIN A overall using RW with pt able to recall to scoot to [place] prior to stand. Pt grooms seated at sink for energy conservation. HR 117 after grooming. Rest break provided in sitting with HR decreasing to 109. Pt bathes seated with S and Vc for lateral leans. Pt reporting girlfriend already has shower seat purchased.  After bathing HR 122. Rest break provided. Pt dresses sit to stand from w/c at sink with CGA for balance standing to advance pants past hips. Pt able to use sock aide with min VC for technique to don B socks. HR 118 post dressing. Pt requires MIN A to don R shoe d/t toes curling under but able ot fasten. Exited session with pt seated in recienr, call light tin reach and exit alamr on  Therapy Documentation Precautions:  Precautions Precautions: Fall Precaution Comments: L foot drop Required Braces or Orthoses: Other Brace Other Brace: PRAFO left in bed Restrictions Weight Bearing Restrictions: Yes RUE Weight Bearing: Weight bearing as tolerated RLE Weight Bearing: Weight bearing as tolerated LLE Weight Bearing: Weight bearing as tolerated Other Position/Activity Restrictions: Dr Erlinda Hong recently discontinued use of R CAM boot, s/p medial malleolus ORIF General:   Vital Signs:   Pain: Pain Assessment Pain Scale: 0-10 Pain Score: 0-No pain ADL: ADL Eating: Independent Where Assessed-Eating: Bed level Grooming: Supervision/safety Where Assessed-Grooming:  Sitting at sink Upper Body Bathing: Supervision/safety Where Assessed-Upper Body Bathing: Shower Lower Body Bathing: Minimal assistance Where Assessed-Lower Body Bathing: Shower Upper Body Dressing: Minimal assistance Where Assessed-Upper Body Dressing: Sitting at sink Lower Body Dressing: Minimal assistance Where Assessed-Lower Body Dressing: Sitting at sink Toileting: Minimal assistance Where Assessed-Toileting: Glass blower/designer: Psychiatric nurse Method: Magazine features editor: Administrator, Civil Service    Praxis   Exercises:   Other Treatments:     Therapy/Group: Individual Therapy  Tonny Branch 07/09/2019, 8:11 AM
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
## 2019-07-09 NOTE — Plan of Care (Signed)
  Problem: Consults Goal: RH GENERAL PATIENT EDUCATION Description: See Patient Education module for education specifics. Outcome: Progressing Goal: Skin Care Protocol Initiated - if Braden Score 18 or less Description: If consults are not indicated, leave blank or document N/A Outcome: Progressing   Problem: RH BOWEL ELIMINATION Goal: RH STG MANAGE BOWEL WITH ASSISTANCE Description: STG Manage Bowel with Minimal Assistance. Outcome: Progressing   Problem: RH BLADDER ELIMINATION Goal: RH STG MANAGE BLADDER WITH ASSISTANCE Description: STG Manage Bladder With Minimal Assistance Outcome: Progressing   Problem: RH SKIN INTEGRITY Goal: RH STG SKIN FREE OF INFECTION/BREAKDOWN Description: STG remain free of infection/breakdown Outcome: Progressing Goal: RH STG MAINTAIN SKIN INTEGRITY WITH ASSISTANCE Description: STG Maintain Skin Integrity With Minimal Assistance. Outcome: Progressing Goal: RH STG ABLE TO PERFORM INCISION/WOUND CARE W/ASSISTANCE Description: STG Able To Perform Incision/Wound Care With Minimal Assistance. Outcome: Progressing   Problem: RH SAFETY Goal: RH STG ADHERE TO SAFETY PRECAUTIONS W/ASSISTANCE/DEVICE Description: STG Adhere to Safety Precautions With Minimal Assistance/Device. Outcome: Progressing Goal: RH STG DECREASED RISK OF FALL WITH ASSISTANCE Description: STG Decreased Risk of Fall With Minimal Assistance. Outcome: Progressing   Problem: RH KNOWLEDGE DEFICIT GENERAL Goal: RH STG INCREASE KNOWLEDGE OF SELF CARE AFTER HOSPITALIZATION Outcome: Progressing   

## 2019-07-09 NOTE — Progress Notes (Signed)
Physical Therapy Session Note  Patient Details  Name: Luke Choi MRN: [id number] Date of Birth: Oct 31, 1970  Today's Date: 07/09/2019 PT Individual Time: 1000-1100 PT Individual Time Calculation (min): 60 min   Short Term Goals: Week 1:  PT Short Term Goal 1 (Week 1): =LTGs due to ELOS  Skilled Therapeutic Interventions/Progress Updates:   Pt received sitting in recliner and agreeable to PT  Ambulatory transfer to [facility] with CGA. Gait training with RW x 170f and CGA-supervision assist from PT with min cues for improved heel contact.   Dynamic gait training to weave through 4 cones x 4 with min cues for AD management and improved heel contact on the L. Stepping over 6 canes x 2 with CGA from PT and min cues from step to gait pattern  Dynamic balance training with 1 UE support on RW while engaged in corn hole toss 2 x 15 Bil.  CGA-supervision assist with min cues for improved terminal knee extension as tolerated.   Kinetron reciprocal movement training 1 min seated, 1 in standing with BUE support, standing balance with no UE support 2 x 10 sec with CGA from PT to improve L weight shifting with use of ankle strategy.   Pt returned to room and performed ambulatory transfer to bed with RW and L AFO. Pt able to remove shoes and AFO with supervision assist. Sit>supine completed without assist, and left supine in bed with call bell in reach and all needs met.          Therapy Documentation Precautions:  Precautions Precautions: Fall Precaution Comments: L foot drop Required Braces or Orthoses: Other Brace Other Brace: PRAFO left in bed Restrictions Weight Bearing Restrictions: Yes RUE Weight Bearing: Weight bearing as tolerated RLE Weight Bearing: Weight bearing as tolerated LLE Weight Bearing: Weight bearing as tolerated Other Position/Activity Restrictions: Dr XErlinda Hongrecently discontinued use of R CAM boot, s/p medial malleolus ORIF Pain: Pain Assessment Pain Scale: 0-10 Pain Score:  0-No pain   Therapy/Group: Individual Therapy  ALorie Phenix11/28/2020, 11:01 AM

## 2019-07-09 NOTE — Progress Notes (Signed)
Occupational Therapy Session Note  Patient Details  Name: Luke Choi MRN: [id number] Date of Birth: [birth year]  Today's Date: 07/09/2019 OT Individual Time: 1400-1455 OT Individual Time Calculation (min): 55 min    Short Term Goals: Week 1:  OT Short Term Goal 1 (Week 1): STG=LTG d/t ELOS  Skilled Therapeutic Interventions/Progress Updates:    1:1. Pt with soreness in neck and shoudlers. Heat provided at end of session. Pt educated on posterior method of enetering walk in shower with RW. Pt completes transfer with CGA and VC for RW management. Pt educated on walker bag to assist with transportation of items. Pt completes kitchen search task at ambulatory level to simulate simple meal prep and dynaimc reaching with RW with CGA and VC for RW management throughout. Pt completes functional mobility in tx gym to complete item retrieval off floor with reacher and reacher bag to gather 6 items. HR 120 after functional mobility. Exited session with pt seated in recliner, call light tin reach and exit alarm on.  Therapy Documentation Precautions:  Precautions Precautions: Fall Precaution Comments: L foot drop Required Braces or Orthoses: Other Brace Other Brace: PRAFO left in bed Restrictions Weight Bearing Restrictions: Yes RUE Weight Bearing: Weight bearing as tolerated RLE Weight Bearing: Weight bearing as tolerated LLE Weight Bearing: Weight bearing as tolerated Other Position/Activity Restrictions: Dr Erlinda Hong recently discontinued use of R CAM boot, s/p medial malleolus ORIF General:   Vital Signs:   Pain:   ADL: ADL Eating: Independent Where Assessed-Eating: Bed level Grooming: Supervision/safety Where Assessed-Grooming: Sitting at sink Upper Body Bathing: Supervision/safety Where Assessed-Upper Body Bathing: Shower Lower Body Bathing: Minimal assistance Where Assessed-Lower Body Bathing: Shower Upper Body Dressing: Minimal assistance Where Assessed-Upper Body Dressing: Sitting  at sink Lower Body Dressing: Minimal assistance Where Assessed-Lower Body Dressing: Sitting at sink Toileting: Minimal assistance Where Assessed-Toileting: Glass blower/designer: Psychiatric nurse Method: Magazine features editor: Administrator, Civil Service    Praxis   Exercises:   Other Treatments:     Therapy/Group: Individual Therapy and Co-Treatment  Tonny Branch 07/09/2019, 2:16 PM

## 2019-07-10 ENCOUNTER — Inpatient Hospital Stay (HOSPITAL_COMMUNITY): Payer: Self-pay

## 2019-07-10 ENCOUNTER — Inpatient Hospital Stay (HOSPITAL_COMMUNITY): Payer: Self-pay | Admitting: Physical Therapy

## 2019-07-10 NOTE — Plan of Care (Signed)
  Problem: Consults Goal: RH GENERAL PATIENT EDUCATION Description: See Patient Education module for education specifics. Outcome: Progressing Goal: Skin Care Protocol Initiated - if Braden Score 18 or less Description: If consults are not indicated, leave blank or document N/A Outcome: Progressing   Problem: RH BOWEL ELIMINATION Goal: RH STG MANAGE BOWEL WITH ASSISTANCE Description: STG Manage Bowel with Minimal Assistance. Outcome: Progressing   Problem: RH BLADDER ELIMINATION Goal: RH STG MANAGE BLADDER WITH ASSISTANCE Description: STG Manage Bladder With Minimal Assistance Outcome: Progressing   Problem: RH SKIN INTEGRITY Goal: RH STG SKIN FREE OF INFECTION/BREAKDOWN Description: STG remain free of infection/breakdown Outcome: Progressing Goal: RH STG MAINTAIN SKIN INTEGRITY WITH ASSISTANCE Description: STG Maintain Skin Integrity With Minimal Assistance. Outcome: Progressing Goal: RH STG ABLE TO PERFORM INCISION/WOUND CARE W/ASSISTANCE Description: STG Able To Perform Incision/Wound Care With Minimal Assistance. Outcome: Progressing   Problem: RH SAFETY Goal: RH STG ADHERE TO SAFETY PRECAUTIONS W/ASSISTANCE/DEVICE Description: STG Adhere to Safety Precautions With Minimal Assistance/Device. Outcome: Progressing Goal: RH STG DECREASED RISK OF FALL WITH ASSISTANCE Description: STG Decreased Risk of Fall With Minimal Assistance. Outcome: Progressing

## 2019-07-10 NOTE — Progress Notes (Signed)
Physical Therapy Session Note  Patient Details  Name: [name] MRN: [id number] Date of Birth: 26-Dec-1970  Today's Date: 07/10/2019 PT Individual Time: 1005-1102 PT Individual Time Calculation (min): 57 min   Short Term Goals: Week 1:  PT Short Term Goal 1 (Week 1): =LTGs due to ELOS  Skilled Therapeutic Interventions/Progress Updates:     Patient in recliner upon PT arrival. Patient alert and agreeable to PT session. Patient denied pain during session, however, stated that he felt fatigue from his earlier OT session. PT donned/doffed B boots and L AFO with total A for time management during session.   Therapeutic Activity: Bed Mobility: Patient performed sit to supine with supervision for safety in a flat bed without rails.  Transfers: Patient performed sit to/from stand x5 with close supervision using a RW. Provided verbal cues for leaning forward to stand and controlling descent when sitting.  Gait Training:  Patient ambulated 110 feet x2 using RW with CGA. Ambulated with L LE in PF and knee flexion, step-through gait pattern with decreased R step length and decreased stance time on L. Provided verbal cues for increased B step length and looking ahead.  Neuromuscular Re-ed: Patient performed blocked practice sit to/from stand with hands on thighs for strengthening and improved forward weight shift with mod A progressing to min A-CGA 2x5 with mat table slightly elevated. Provided manual facilitation for forward weight shift and cues for scooting forward before standing. Patient performed standing balance with perturbations 2x2 min with posterior LOB x5-6 each trial, able to self correct x2-3 each trial.  Performed SLS 2x5 on L with B UE support progressing to 2 finger support on R and L UE support to L UE support only with CGA-close supervision for balance using a RW.  Patient in bed at end of session with breaks locked, bed alarm set, and all needs within reach. Educated on [place]  recovery including decreased control of emotions including anger, frustration, and sadness, increased fatigue from both physical and mental stimulation, utilizing a quiet and dark space for recovery when fatigued, and him and his fiance to look out for any new symptoms such as headache, vision changes, dizziness, personality changes, or changes in sexual behavior and to report them to his MD. Patient was receptive to all education.    Therapy Documentation Precautions:  Precautions Precautions: Fall Precaution Comments: L foot drop Required Braces or Orthoses: Other Brace Other Brace: PRAFO left in bed Restrictions Weight Bearing Restrictions: Yes RUE Weight Bearing: Weight bearing as tolerated RLE Weight Bearing: Weight bearing as tolerated LLE Weight Bearing: Weight bearing as tolerated Other Position/Activity Restrictions: Dr Erlinda Hong recently discontinued use of R CAM boot, s/p medial malleolus ORIF    Therapy/Group: Individual Therapy  Shaeleigh Graw L Fahed Morten PT, DPT  07/10/2019, 12:35 PM

## 2019-07-10 NOTE — Progress Notes (Signed)
Physical Therapy Session Note  Patient Details  Name: Vuk Skillern MRN: [id number] Date of Birth: 23-Apr-1971  Today's Date: 07/10/2019 PT Individual Time: 1245-1345 PT Individual Time Calculation (min): 60 min   Short Term Goals: Week 1:  PT Short Term Goal 1 (Week 1): =LTGs due to ELOS  Skilled Therapeutic Interventions/Progress Updates:   Pt in supine and agreeable to therapy, no c/o pain. Supine>sit w/ supervision and donned R shoe w/ supervision, total assist to don L shoe and LAFO. Ambulated to therapy gym w/ RW and CGA, 150'. Worked on balance and gait training w/ LRAD. Ambulated 23' and 100' w/ min assist using SPC. Pt already familiar w/ SPC and gait pattern from previous L knee injury. Pt's gait quality is improved w/ SPC vs RW, decreased antalgic gait pattern, however decreased balance and stability. Performed Berg Balance Scale and scored 25/56, explained significance of results to pt. This therapist most likely to recommend pt use RW at home for safety and for decreased fall risk, will continue to work on ambulation w/ LRAD while admitted to [place]. NuStep 5 min @ level 1 and 5 min @ level 3 w/ all extremities to work on global endurance and strengthening. 2-3/10 on modified RPE scale, mild increase in work of breathing. Educated pt on endurance deficits and progression in light of prolonged and complicated hospital stay. Pt verbalized understanding and has good anticipatory awareness and insight into his deficits and how they will impact his life for the foreseeable future. Ambulated back to room w/ CGA using RW and ended session in supine, all needs in reach.   Therapy Documentation Precautions:  Precautions Precautions: Fall Precaution Comments: L foot drop Required Braces or Orthoses: Other Brace Other Brace: PRAFO left in bed Restrictions Weight Bearing Restrictions: Yes RUE Weight Bearing: Weight bearing as tolerated RLE Weight Bearing: Weight bearing as tolerated LLE Weight  Bearing: Weight bearing as tolerated Other Position/Activity Restrictions: Dr Erlinda Hong recently discontinued use of R CAM boot, s/p medial malleolus ORIF Vital Signs: Therapy Vitals Temp: 97.6 F (36.4 C) Temp Source: Oral Pulse Rate: (!) 108 Resp: 18 BP: 111/71 Patient Position (if appropriate): Lying Oxygen Therapy SpO2: 99 % O2 Device: Room Air Pain:    Therapy/Group: Individual Therapy  Angelyn Osterberg K Tyshaun Vinzant 07/10/2019, 2:10 PM

## 2019-07-10 NOTE — Progress Notes (Signed)
Occupational Therapy Session Note  Patient Details  Name: Luke Choi MRN: [id number] Date of Birth: 1971-04-04  Today's Date: 07/10/2019 OT Individual Time: 2500-3704 OT Individual Time Calculation (min): 60 min    Short Term Goals: Week 1:  OT Short Term Goal 1 (Week 1): STG=LTG d/t ELOS  Skilled Therapeutic Interventions/Progress Updates:    Pt received supine with no c/o pain. Pt completed bed mobility with mod I. Pt used Rw to complete sit > stand and ambulate around room to retrieve clothing items from low drawers. CGA provided throughout. Cueing provided to increased LLE plantar flexion in standing. Pt sat at sink in w/c and completed UB bathing and dressing with set up assist. Pt stood with CGA to doff LB clothing. Pt prefers to wash LB seated with lateral leans. Cueing/edu provided for energy conservation and balance strategies when donning LB clothing. Pt able to don pants with CGA overall. Pt donned socks with set up assist and shoes with min A for L shoe with AFO. Great improvement overall! Pt required rest break following bathing. Pt completed 100 ft of functional mobility with RW with CGA-(S) overall. Pt stood with BLE on wedge to promote LLE plantar flexion and calf stretch. Cueing provided for increasing stretch and weightbearing. Pt completed 30 sec trials 3x with rest break for recovery between each set. Demo provided re navigating simulated threshold into shower. Pt returned demo and completed 3 sets of blocked practice for stepping with CGA. Discussed R vs L leading foot and trialed both. HR assessed, 107 bpm following activity. Pt reporting he can feel when tachycardic and discussed need for self-monitoring. Pt returned to room and left sitting up in the recliner with all needs met.   Therapy Documentation Precautions:  Precautions Precautions: Fall Precaution Comments: L foot drop Required Braces or Orthoses: Other Brace Other Brace: PRAFO left in bed Restrictions Weight  Bearing Restrictions: Yes RUE Weight Bearing: Weight bearing as tolerated RLE Weight Bearing: Weight bearing as tolerated LLE Weight Bearing: Weight bearing as tolerated Other Position/Activity Restrictions: Dr Erlinda Hong recently discontinued use of R CAM boot, s/p medial malleolus ORIF   Therapy/Group: Individual Therapy  Curtis Sites 07/10/2019, 6:55 AM
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
## 2019-07-11 ENCOUNTER — Inpatient Hospital Stay (HOSPITAL_COMMUNITY): Payer: Self-pay | Admitting: Physical Therapy

## 2019-07-11 ENCOUNTER — Encounter (HOSPITAL_COMMUNITY): Payer: Self-pay | Admitting: Psychology

## 2019-07-11 ENCOUNTER — Inpatient Hospital Stay (HOSPITAL_COMMUNITY): Payer: Self-pay

## 2019-07-11 DIAGNOSIS — S069X2D Unspecified intracranial injury with loss of consciousness of 31 minutes to 59 minutes, subsequent encounter: Secondary | ICD-10-CM

## 2019-07-11 MED ORDER — OXYCODONE HCL 5 MG PO TABS
10.0000 mg | ORAL_TABLET | ORAL | Status: DC | PRN
  Administered 2019-07-12: 5 mg via ORAL
  Filled 2019-07-11: qty 2

## 2019-07-11 NOTE — Progress Notes (Signed)
Occupational Therapy Session Note  Patient Details  Name: Luke Choi MRN: [id number] Date of Birth: 1971/04/15  Today's Date: 07/11/2019 OT Individual Time: 9295-7473 OT Individual Time Calculation (min): 75 min    Short Term Goals: Week 1:  OT Short Term Goal 1 (Week 1): STG=LTG d/t ELOS  Skilled Therapeutic Interventions/Progress Updates:    Pt received supine with no c/o pain. Discussed d/c planning and upcoming conference. Pt completed bed mobility to EOB with mod I. Pt's LLE occluded for shower. Pt completed functional mobility into shower with CGA. Edu provided on importance of completing LB dressing seated and energy conservation strategies. Pt sat on TTB and completed all bathing with set up assistance. Pt demonstrated good carryover of previous edu, asking to don underwear and pants at the same time to reduce number of sit > stands. Pt required min A for LOB to the R when standing. Pt able to don shirt mod I. With cueing for technique, pt able to don socks and shoes (with AFO!) with set up assist EOB. Pt used RW to complete 125 of functional mobility down to the therapy gym. Preparatory activity completed to facilitate plantar flexion in sitting, with wedge. Pt completed standing level calf raises with intermittent UE support and cueing provided for facilitating upright posture and anterior pelvic tilt. HR intermittently monitored throughout session with no increase > 105 bpm. Pt completed blocked practice sit <> stands with focus on eccentric control and reduced reliance on UE support x2 trials with CGA-min A required with increased fatigue. Pt then completed BUE strengthening circuit with a 3 lb dowel in standing, CGA provided throughout. Activity completed in standing to challenge standing balance and functional activity tolerance. 3x 10 repetitions with rest breaks required between each set. Pt returned to room and was left supine with all needs met.   Therapy  Documentation Precautions:  Precautions Precautions: Fall Precaution Comments: L foot drop Required Braces or Orthoses: Other Brace Other Brace: PRAFO left in bed Restrictions Weight Bearing Restrictions: No RUE Weight Bearing: Weight bearing as tolerated RLE Weight Bearing: Weight bearing as tolerated LLE Weight Bearing: Weight bearing as tolerated Other Position/Activity Restrictions: Dr Erlinda Hong recently discontinued use of R CAM boot, s/p medial malleolus ORIF   Therapy/Group: Individual Therapy  Curtis Sites 07/11/2019, 6:55 AM

## 2019-07-11 NOTE — Progress Notes (Signed)
Physical Therapy Session Note  Patient Details  Name: Luke Choi MRN: [id number] Date of Birth: 04-24-1971  Today's Date: 07/11/2019 PT Individual Time: 9798-9211 PT Individual Time Calculation (min): 69 min   Short Term Goals: Week 1:  PT Short Term Goal 1 (Week 1): =LTGs due to ELOS  Skilled Therapeutic Interventions/Progress Updates:   Pt in supine and agreeable to therapy, denies pain. Supervision bed mobility. Ambulated to therapy gym w/ SPC and min assist, 1 seated rest break. Worked on LLE NMR with AP weight shifts in staggered stance and the lateral weight shifting in neutral stance. CGA-min assist for safety w/ mirror for visual feedback of L weight shifting. Pt very hesitant to put all weight on LLE. L 2" step ups w/ RW support, x10 reps w/ verbal cues to slow speed and rely as little as possible on external support for balance. Performed many reps of R forward and backward stepping w/ emphasis on slow and controlled L lateral weight shifting during weight acceptance. Worked on gait training w/o AD, min assist 50' x2 bouts. Very slight antalgic gait pattern w/o AD, but has surprisingly smooth and reciprocal gait pattern. Pt much prefers RW use as he "does not trust" his LLE. Would continue to recommend RW use at home for safety and energy conservation. Extensively discussed energy conservation strategies including pacing, minimizing community gait in area w/o frequent places to sit and rest, and minimizing number of commitments in 1 day. This is primarily in light of pt's prolonged hospital stay. Additionally discussed safety at home including recommendation for 24/7 supervision, RW use, and taking a rest break before it is absolutely necessary. Pt verbalized understanding and in agreement w/ all education. NuStep 5 min x2 @ level 3 for global endurance and strengthening. Ambulated back to room w/ RW, supervision. Ended session in supine, all needs in reach.    Pt w/ small amount of blood  draining from L lower leg bandage, made RN aware.   Therapy Documentation Precautions:  Precautions Precautions: Fall Precaution Comments: L foot drop Required Braces or Orthoses: Other Brace Other Brace: PRAFO left in bed Restrictions Weight Bearing Restrictions: No RUE Weight Bearing: Weight bearing as tolerated RLE Weight Bearing: Weight bearing as tolerated LLE Weight Bearing: Weight bearing as tolerated Other Position/Activity Restrictions: Dr Erlinda Hong recently discontinued use of R CAM boot, s/p medial malleolus ORIF  Therapy/Group: Individual Therapy  Avonna Iribe Clent Demark 07/11/2019, 3:23 PM
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
## 2019-07-11 NOTE — Progress Notes (Addendum)
Physical Therapy Session Note  Patient Details  Name: Luke Choi MRN: [id number] Date of Birth: 12-Sep-1970  Today's Date: 07/11/2019 PT Individual Time: 1123-1202 PT Individual Time Calculation (min): 39 min   Short Term Goals: Week 1:  PT Short Term Goal 1 (Week 1): =LTGs due to ELOS  Skilled Therapeutic Interventions/Progress Updates:  Pt received in bed & agreeable to tx. Pt transfers to sitting EOB with mod I with hospital bed features. Pt is able to don R shoe sitting EOB but requires assistance to don L shoe & AFO. Pt transfers sit<>stand with supervision and ambulates room>ortho gym with RW & supervision with therapist adjusting height of RW & educating pt on upright posture & forward gaze as pt demonstrates forward trunk flexion, rounded shoulders, downward gaze. After ambulating to ortho gym SpO2 = 100%, HR = 98-99 bpm. Pt completes car transfer at [facility] simulated height with pt able to recall need to sit then place BLE in/out vs stepping in/out of car with pt able to complete task with supervision. Pt negotiates ramp, mulch, & curb step with instructional cuing for sequencing with supervision overall. Pt completes transfer from low, compliant couch in apartment with supervision. Pt engaged in trampoline ball toss while standing on compliant surface with normal and narrow BOS with min assist with task focusing on standing balance. Pt ambulates back to room with RW & supervision with pt reporting fatigue following session. Pt is able to doff B shoes & L AFO sitting EOB with supervision and returns to supine in bed with mod I. Pt left in bed with alarm set & all needs in reach.  Discussed safety in home with pt reporting he has 3 large dogs, as well as educated pt on his inability to drive/return to work until cleared by MD.  Therapy Documentation Precautions:  Precautions Precautions: Fall Precaution Comments: L foot drop Required Braces or Orthoses: Other Brace Other  Brace: PRAFO left in bed Restrictions Weight Bearing Restrictions: No RUE Weight Bearing: Weight bearing as tolerated RLE Weight Bearing: Weight bearing as tolerated LLE Weight Bearing: Weight bearing as tolerated Other Position/Activity Restrictions: Dr Erlinda Hong recently discontinued use of R CAM boot, s/p medial malleolus ORIF   Pain: Pt denies c/o pain.    Therapy/Group: Individual Therapy  Waunita Schooner 07/11/2019, 12:06 PM

## 2019-07-11 NOTE — Discharge Summary (Signed)
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 alcohol Weightbearing as tolerated  Change gauze and apply Vaseline to the incision site and wound left leg daily  Medications at discharge. 1.  Tylenol as needed 2.  Eliquis 5 mg p.o. twice daily 3.  Melatonin 6 mg p.o. nightly as needed 4.  Lopressor 37.5 mg p.o. twice daily 5.  Oxycodone 5 to 10 mg every 4 hours as needed pain 6.  Seroquel 100 mg p.o. nightly 7.  Topamax 25 mg p.o. daily 8.  Ultram 50 mg every 12 hours x1 week and stop  Discharge Instructions    Ambulatory referral to Physical Medicine Rehab   Complete by: As directed    Transitional care follow up one month/TBI      Follow-up Information    Ranelle Oyster, MD Follow up.   Specialty: Physical Medicine and Rehabilitation Why: Office to call for appointment Contact information: [address] [place] [address] [phone]        Tarry Kos, MD Follow up.   Specialty: Orthopedic Surgery Why: Call for appointment Contact information: [address] [place] [address] [phone]        Diamantina Monks, MD Follow up.   Specialty: Surgery Why: Call for appointment as needed Contact information: [address] [place] [address] [place] [address] [phone]           Signed: Charlton Amor 07/13/2019, 5:32 AM
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
## 2019-07-12 ENCOUNTER — Inpatient Hospital Stay (HOSPITAL_COMMUNITY): Payer: BC Managed Care – PPO

## 2019-07-12 ENCOUNTER — Encounter (HOSPITAL_COMMUNITY): Payer: BC Managed Care – PPO

## 2019-07-12 ENCOUNTER — Ambulatory Visit (HOSPITAL_COMMUNITY): Payer: BC Managed Care – PPO | Admitting: Physical Therapy

## 2019-07-12 DIAGNOSIS — G44319 Acute post-traumatic headache, not intractable: Secondary | ICD-10-CM

## 2019-07-12 MED ORDER — MELATONIN 3 MG PO TABS: 6.0000 mg | ORAL_TABLET | Freq: Every evening | ORAL | 0 refills | Status: DC | PRN

## 2019-07-12 MED ORDER — TOPIRAMATE 25 MG PO TABS: 25.0000 mg | ORAL_TABLET | Freq: Every day | ORAL | 0 refills | Status: DC

## 2019-07-12 MED ORDER — DOCUSATE SODIUM 100 MG PO CAPS: 100.0000 mg | ORAL_CAPSULE | Freq: Two times a day (BID) | ORAL | 0 refills | Status: DC

## 2019-07-12 MED ORDER — TRAMADOL HCL 50 MG PO TABS: 50.0000 mg | ORAL_TABLET | Freq: Two times a day (BID) | ORAL | 0 refills | Status: DC

## 2019-07-12 MED ORDER — METOPROLOL TARTRATE 37.5 MG PO TABS: 37.5000 mg | ORAL_TABLET | Freq: Two times a day (BID) | ORAL | 0 refills | Status: DC

## 2019-07-12 MED ORDER — APIXABAN 5 MG PO TABS: 5.0000 mg | ORAL_TABLET | Freq: Two times a day (BID) | ORAL | 1 refills | Status: DC

## 2019-07-12 MED ORDER — OXYCODONE HCL 10 MG PO TABS: 10.0000 mg | ORAL_TABLET | ORAL | 0 refills | Status: DC | PRN

## 2019-07-12 MED FILL — MELATONIN 3 MG TABS: 3 | 15 days supply | Qty: 30 | Fill #0

## 2019-07-12 MED FILL — ELIQUIS 5 MG TABLET: 5 | 30 days supply | Qty: 60 | Fill #0

## 2019-07-12 MED FILL — traMADol HCL 50 MG TABS: 50 | 7 days supply | Qty: 14 | Fill #0

## 2019-07-12 MED FILL — METOPROLOL TARTRATE 25 MG T: 25 | 30 days supply | Qty: 90 | Fill #0

## 2019-07-12 MED FILL — oxyCODONE HCL 10 MG TABS: 10 | 5 days supply | Qty: 30 | Fill #0

## 2019-07-12 MED FILL — TOPIRAMATE 25 MG TABLET: 25 | 30 days supply | Qty: 30 | Fill #0

## 2019-07-12 NOTE — Progress Notes (Signed)
Patient's fiancee at bedside during dressing change. Incision pink and open slightly where scab came off yesterday. Petroleum gauze, kerlix and ace wrap applied. Education done regarding monitoring for s/s of infection. Patient and partner verbalized understanding. All questions answered.

## 2019-07-12 NOTE — Progress Notes (Signed)
Occupational Therapy Discharge Summary  Patient Details  Name: Luke Choi MRN: [id number] Date of Birth: 04-Jan-1971  Today's Date: 07/12/2019 OT Individual Time: 1400-1500 OT Individual Time Calculation (min): 60 min    Patient has met 7 of 7 long term goals due to improved activity tolerance, improved balance, postural control, ability to compensate for deficits, improved attention, improved awareness and improved coordination.  Patient to discharge at overall Supervision level.  Patient's care partner is independent to provide the necessary physical and cognitive assistance at discharge.  Family education has taken place with Otila Kluver, pt's girlfriend.   All treatment goals met.   Recommendation:  Patient will benefit from ongoing skilled OT services in home health setting to continue to advance functional skills in the area of BADL and Vocation.  Equipment: No equipment provided  Reasons for discharge: treatment goals met and discharge from hospital  Patient/family agrees with progress made and goals achieved: Yes   Skilled OT Intervention:  Family education session took place with pt and his girlfriend [place]. Otila Kluver was incredibly supportive and involved throughout session. Education provided, as well as demonstration re toilet and shower transfers. Demonstrated pt's backward step in method for shower and reviewed use of AE in the home. Pt completed 150 ft of functional mobility with (S). Pt completed dynamic standing balance activity with stepping without UE support with min A. Reviewed activities safe to perform with and without therapy. Provided pt and Otila Kluver with handouts and reviewed risk for depression and future substance abuse associated with TBI, as well as general TBI education. Pt was left supine with all needs met. Otila Kluver signed off to assist with bathroom transfers.   OT Discharge Precautions/Restrictions  Precautions Precautions: Fall Precaution Comments: L foot  drop Restrictions Weight Bearing Restrictions: Yes RUE Weight Bearing: Weight bearing as tolerated RLE Weight Bearing: Weight bearing as tolerated LLE Weight Bearing: Weight bearing as tolerated Pain Pain Assessment Pain Scale: 0-10 Pain Score: 0-No pain Pain Type: Acute pain Pain Location: Generalized Pain Descriptors / Indicators: Aching;Sore Pain Frequency: Intermittent Pain Intervention(s): Medication (See eMAR) ADL ADL Eating: Independent Where Assessed-Eating: Chair Grooming: Supervision/safety Where Assessed-Grooming: Sitting at sink Upper Body Bathing: Modified independent Where Assessed-Upper Body Bathing: Shower Lower Body Bathing: Supervision/safety Where Assessed-Lower Body Bathing: Shower Upper Body Dressing: Modified independent (Device) Where Assessed-Upper Body Dressing: Sitting at sink Lower Body Dressing: Supervision/safety Where Assessed-Lower Body Dressing: Sitting at sink Toileting: Supervision/safety Where Assessed-Toileting: Glass blower/designer: Close supervision Armed forces technical officer Method: Magazine features editor: Close supervision Social research officer, government Method: Heritage manager: Gaffer Baseline Vision/History: Wears glasses(legally blind in R eye) Wears Glasses: At all times Patient Visual Report: No change from baseline Vision Assessment?: No apparent visual deficits Perception  Perception: Within Functional Limits Praxis Praxis: Intact Cognition Overall Cognitive Status: Within Functional Limits for tasks assessed Arousal/Alertness: Awake/alert Orientation Level: Oriented X4 Attention: Alternating Focused Attention: Appears intact Sustained Attention: Appears intact Alternating Attention: Appears intact Memory: Impaired Memory Impairment: Decreased short term memory Decreased Short Term Memory: Verbal complex;Functional complex Awareness: Appears intact Problem Solving: Appears  intact Reasoning: Appears intact Initiating: Appears intact Safety/Judgment: Appears intact [employer] Scales of Cognitive Functioning: Purposeful/appropriate Sensation Sensation Light Touch: Impaired by gross assessment Central sensation comments: Reports some numbness in LLE along wound ,and in L forearm. All sensory testing intact. Hot/Cold: Appears Intact Proprioception: Appears Intact Stereognosis: Appears Intact Coordination Gross Motor Movements are Fluid and Coordinated: Yes Fine Motor Movements are Fluid and Coordinated: Yes Finger  Nose Finger Test: [facility] Motor  Motor Motor: Other (comment) Motor - Discharge Observations: generalized weakness, L foot drop Mobility  Bed Mobility Bed Mobility: Rolling Right;Rolling Left;Supine to Sit;Sit to Supine Rolling Right: Independent Rolling Left: Independent Supine to Sit: Independent Sit to Supine: Independent Transfers Sit to Stand: Supervision/Verbal cueing Stand to Sit: Supervision/Verbal cueing  Trunk/Postural Assessment  Cervical Assessment Cervical Assessment: Within Functional Limits Thoracic Assessment Thoracic Assessment: Within Functional Limits Lumbar Assessment Lumbar Assessment: Within Functional Limits Postural Control Postural Control: Deficits on evaluation Righting Reactions: delayed  Balance Balance Balance Assessed: Yes Static Sitting Balance Static Sitting - Level of Assistance: 7: Independent Dynamic Sitting Balance Dynamic Sitting - Level of Assistance: 7: Independent Static Standing Balance Static Standing - Level of Assistance: 5: Stand by assistance Dynamic Standing Balance Dynamic Standing - Level of Assistance: 5: Stand by assistance Extremity/Trunk Assessment RUE Assessment RUE Assessment: Exceptions to [facility] General Strength Comments: generalized weakness, 39 lbs LUE Assessment LUE Assessment: Exceptions to [facility] General Strength Comments: generalized weakness, 40 lbs   Curtis Sites 07/12/2019, 9:17 AM
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
## 2019-07-12 NOTE — Plan of Care (Signed)
  Problem: Consults Goal: RH GENERAL PATIENT EDUCATION Description: See Patient Education module for education specifics. Outcome: Completed/Met Goal: Skin Care Protocol Initiated - if Braden Score 18 or less Description: If consults are not indicated, leave blank or document N/A Outcome: Completed/Met   Problem: RH BOWEL ELIMINATION Goal: RH STG MANAGE BOWEL WITH ASSISTANCE Description: STG Manage Bowel with Minimal Assistance. Outcome: Completed/Met   Problem: RH BLADDER ELIMINATION Goal: RH STG MANAGE BLADDER WITH ASSISTANCE Description: STG Manage Bladder With Minimal Assistance Outcome: Completed/Met   Problem: RH SKIN INTEGRITY Goal: RH STG SKIN FREE OF INFECTION/BREAKDOWN Description: STG remain free of infection/breakdown Outcome: Completed/Met Goal: RH STG MAINTAIN SKIN INTEGRITY WITH ASSISTANCE Description: STG Maintain Skin Integrity With Minimal Assistance. Outcome: Completed/Met Goal: RH STG ABLE TO PERFORM INCISION/WOUND CARE W/ASSISTANCE Description: STG Able To Perform Incision/Wound Care With Minimal Assistance. Outcome: Completed/Met   Problem: RH SAFETY Goal: RH STG ADHERE TO SAFETY PRECAUTIONS W/ASSISTANCE/DEVICE Description: STG Adhere to Safety Precautions With Minimal Assistance/Device. Outcome: Completed/Met Goal: RH STG DECREASED RISK OF FALL WITH ASSISTANCE Description: STG Decreased Risk of Fall With Minimal Assistance. Outcome: Completed/Met   Problem: RH KNOWLEDGE DEFICIT GENERAL Goal: RH STG INCREASE KNOWLEDGE OF SELF CARE AFTER HOSPITALIZATION Outcome: Completed/Met

## 2019-07-12 NOTE — Progress Notes (Signed)
Occupational Therapy Session Note  Patient Details  Name: Luke Choi MRN: [id number] Date of Birth: 1971/05/14  Today's Date: 07/12/2019 OT Individual Time: 0940-7680 OT Individual Time Calculation (min): 60 min    Session 2: OT Individual Time: 1130-1200 OT Individual Time Calculation (min): 30  min     Short Term Goals: Week 1:  OT Short Term Goal 1 (Week 1): STG=LTG d/t ELOS  Skilled Therapeutic Interventions/Progress Updates:    Pt received supine in bed with no c/o pain. Discussed d/c and AE use at home, as well as family edu that will be taking place today. Pt completed bed mobility with mod I, use of bed rail. Pt donned crocs for short distance transfer to bathroom. (S) provided with use of RW. Pt able to stand with RW at toilet to urinate. Slight LOB toward the R with self correction when reaching distally for pants. Pt sat at the sink and completed bathing/dressing with (S) and set up assist. Pants and underwear donned (S), shirt donned mod I. Pt with good carryover of previous education for safety. Pt able to don socks and B shoes with L AFO with set up assist seated in the w/c. Pt used RW to complete functional mobility to the therapy gym with (S), 125 ft. Pt completed grip strength testing, with improvement of 17 lbs on the R side and 2 lbs on the L. Pt completed BUE strengthening circuit seated and standing to challenge dynamic standing balance 10 repetitions x3 sets. Min A required when reaching overhead for balance. Pt reporting no pain throughout. Pt returned to his room and was left sitting up in the w/c with all needs met.  Session 2: Pt received sitting up in the recliner with no c/o pain. Pt donned shoes with set up assist. Pt used RW and completed 125 ft of functional mobility to the therapy gym with (S). Pt transferred to prone on mat and completed scapular stability work with guided retraction/protraction on elbows. Pt required extended rest break, vitals assessed and all  VSS. Pt completed dynamic standing balance activity alternating isolated single leg stance, graded by reducing UE reliance on RW. Pt required as much as mod A with no UE support on Rw and attempting to lift R/L LE. With single UE support CGA provided. Pt returned to room and left supine with all needs met.   Therapy Documentation Precautions:  Precautions Precautions: Fall Precaution Comments: L foot drop Required Braces or Orthoses: Other Brace Other Brace: PRAFO left in bed Restrictions Weight Bearing Restrictions: No RUE Weight Bearing: Weight bearing as tolerated RLE Weight Bearing: Weight bearing as tolerated LLE Weight Bearing: Weight bearing as tolerated Other Position/Activity Restrictions: Dr Erlinda Hong recently discontinued use of R CAM boot, s/p medial malleolus ORIF   Therapy/Group: Individual Therapy  Curtis Sites 07/12/2019, 6:51 AM
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
## 2019-07-12 NOTE — Progress Notes (Signed)
Physical Therapy Discharge Summary  Patient Details  Name: Luke Choi MRN: [id number] Date of Birth: July 04, 1971  Today's Date: 07/12/2019 PT Individual Time: 8657-8469 PT Individual Time Calculation (min): 54 min   Pt received in supine, agreeable to therapy and denies pain. Bed mobility performed independently and therapist donned pt's personal LAFO. Pt reports "something doesn't feel right". Added in small heel wedge for increased support 2/2 plantarflexion contracture, pt reports this felt better. Educated pt and fiance on use of heel wedge until pt's ROM in L ankle improves to neutral and then it can be removed. Provided w/ written handout regarding foot drop and use of AFO to address foot drop. Also wrote down name of foot-up brace should active DF improve. Both verbalized understanding and in agreement w/ plan. Educated pt's fiance on supervision level assist for household and community gait w/ RW, transfers including car transfer, and dynamic balance tasks in standing. Educated on [place] for stair negotiation including technique and CGA for floor transfer. Discussed risk of further brain damage should pt fall and hit head on floor. Pt's fiance is a paramedic and is well-versed in emergency response. Pt continues to demonstrate appropriate and purposeful behaviors in addition to anticipatory awareness of safety deficits, consistent w/ at least a Rancho VIII TBI. Returned to room and ended session in supine, all needs in reach.   Patient has met 7 of 8 long term goals due to improved activity tolerance, improved balance, improved postural control and functional use of  right lower extremity and left lower extremity.  Patient to discharge at an ambulatory level Supervision.   Patient's care partner is independent to provide the necessary physical assistance at discharge.  Reasons goals not met: Pt continues to require CGA for stair negotiation 2/2 balance impairments.   Recommendation:  Patient will  benefit from ongoing skilled PT services in home health setting to continue to advance safe functional mobility, address ongoing impairments in functional balance, endurance, LLE NMR, and global strength, and minimize fall risk.  Equipment: RW  Reasons for discharge: treatment goals met and discharge from hospital  Patient/family agrees with progress made and goals achieved: Yes  PT Discharge Precautions/Restrictions Precautions Precautions: Fall Restrictions LLE Weight Bearing: Weight bearing as tolerated Vision/Perception  Perception Perception: Within Functional Limits Praxis Praxis: Intact  Cognition Overall Cognitive Status: Within Functional Limits for tasks assessed Arousal/Alertness: Awake/alert Orientation Level: Oriented X4 Alternating Attention: Appears intact Memory: Appears intact Awareness: Appears intact Reasoning: Appears intact Safety/Judgment: Appears intact Rancho [employer] Scales of Cognitive Functioning: Purposeful/appropriate Sensation Sensation Light Touch: Impaired by gross assessment(reports numbness/tingling in L lower leg and foot) Coordination Gross Motor Movements are Fluid and Coordinated: Yes Motor  Motor Motor: Other (comment) Motor - Discharge Observations: generalized weakness, L foot drop  Mobility Bed Mobility Bed Mobility: Rolling Right;Rolling Left;Supine to Sit;Sit to Supine Rolling Right: Independent Rolling Left: Independent Supine to Sit: Independent Sit to Supine: Independent Transfers Transfers: Sit to Stand;Stand to Sit;Stand Pivot Transfers Sit to Stand: Supervision/Verbal cueing Stand to Sit: Supervision/Verbal cueing Stand Pivot Transfers: Supervision/Verbal cueing Transfer (Assistive device): Rolling walker Locomotion  Gait Ambulation: Yes Gait Assistance: Supervision/Verbal cueing Gait Distance (Feet): 150 Feet Assistive device: Rolling walker Gait Assistance Details: Verbal cues for safe use of DME/AE;Verbal  cues for gait pattern Gait Gait: Yes Gait Pattern: Impaired Gait Pattern: Decreased hip/knee flexion - left;Decreased dorsiflexion - left;Decreased weight shift to left Gait velocity: decreased Stairs / Additional Locomotion Stairs: Yes Stairs Assistance: Contact guard assist Stair Management Technique:  One rail Right Number of Stairs: 4 Height of Stairs: 6 Wheelchair Mobility Wheelchair Mobility: No  Trunk/Postural Assessment  Cervical Assessment Cervical Assessment: Within Functional Limits Thoracic Assessment Thoracic Assessment: Within Functional Limits Lumbar Assessment Lumbar Assessment: Within Functional Limits Postural Control Postural Control: Deficits on evaluation(insufficient)  Balance Balance Balance Assessed: Yes Static Sitting Balance Static Sitting - Level of Assistance: 7: Independent Dynamic Sitting Balance Dynamic Sitting - Level of Assistance: 7: Independent Static Standing Balance Static Standing - Level of Assistance: 5: Stand by assistance Dynamic Standing Balance Dynamic Standing - Level of Assistance: 5: Stand by assistance Extremity Assessment  RLE Assessment RLE Assessment: Exceptions to [facility] Passive Range of Motion (PROM) Comments: WFL General Strength Comments: Globally 4/5 LLE Assessment LLE Assessment: Exceptions to [facility] Passive Range of Motion (PROM) Comments: Ankle limited to -5 deg DF, mild PF contracture that is much improved, hard end feel w/ DF overpressure but no pain General Strength Comments: Globally 3+ to 4/5    Meribeth Vitug K Marla Pouliot 07/12/2019, 8:03 AM

## 2019-07-13 DIAGNOSIS — S069X2S Unspecified intracranial injury with loss of consciousness of 31 minutes to 59 minutes, sequela: Secondary | ICD-10-CM

## 2019-07-13 MED ORDER — QUETIAPINE FUMARATE 100 MG PO TABS: 100.0000 mg | ORAL_TABLET | Freq: Every day | ORAL | 1 refills | Status: DC

## 2019-07-13 MED FILL — QUETIAPINE FUMARATE 100 MG: 100 | 30 days supply | Qty: 30 | Fill #0

## 2019-07-13 NOTE — Progress Notes (Signed)
  Subjective: Mr. Peral is doing well. He is scheduled to be d/c'ed today.   He is very happy and ready to be home. His LLE incisional wound is doing well, they have been applying vaseline gauze daily. Dressing changed today.  No complaints.  Objective: Vital signs in last 24 hours: Temp:  [97.6 F (36.4 C)-98.5 F (36.9 C)] 98 F (36.7 C) (12/02 0333) Pulse Rate:  [92-100] 92 (12/02 0333) Resp:  [15-16] 15 (12/02 0333) BP: (102-121)/(74-75) 102/75 (12/02 0333) SpO2:  [93 %-100 %] 93 % (12/02 0333) Weight:  [72.3 kg] 72.3 kg (12/02 0333) Last BM Date: 07/11/19  Intake/Output from previous day: 12/01 0701 - 12/02 0700 In: 620 [P.O.:620] Out: 2025 [Urine:2025] Intake/Output this shift: Total I/O In: 240 [P.O.:240] Out: -   General appearance: alert, cooperative, no distress and sitting up in bed Extremities: LLE wound, scabbing noted with underlying granulation tissue, exposed superiorly. No erythema. Incision and LLE healed wound purple in color. No drainage noted. No purulence noted. Soft, non tender. Pulses: 2+ and symmetric   Lab Results:  CBC    Component Value Date/Time   WBC 8.1 07/06/2019 0446   RBC 4.15 (L) 07/06/2019 0446   HGB 11.6 (L) 07/06/2019 0446   HCT 36.5 (L) 07/06/2019 0446   PLT 553 (H) 07/06/2019 0446   MCV 88.0 07/06/2019 0446   MCH 28.0 07/06/2019 0446   MCHC 31.8 07/06/2019 0446   RDW 14.5 07/06/2019 0446   LYMPHSABS 1.5 07/06/2019 0446   MONOABS 0.9 07/06/2019 0446   EOSABS 0.3 07/06/2019 0446   BASOSABS 0.1 07/06/2019 0446    BMET No results for input(s): NA, K, CL, CO2, GLUCOSE, BUN, CREATININE, CALCIUM in the last 72 hours. PT/INR No results for input(s): LABPROT, INR in the last 72 hours. ABG No results for input(s): PHART, HCO3 in the last 72 hours.  Invalid input(s): PCO2, PO2  Studies/Results: No results found.  Anti-infectives: Anti-infectives (From admission, onward)   None      Assessment/Plan:  Mr. Ensminger is  doing well.  They have been applying Vaseline gauze, 4 x 4, Kerlix, Ace wrap to left lower extremity wound. Continue with Vaseline daily followed by 4 x 4 gauze, Kerlix, Ace wrap until healed.  Patient can follow-up with plastic surgery 2 to 4 weeks postop.  He was advised to call for an appointment.  If he has any questions prior to his scheduled follow-up he can call with any questions or concerns.   LOS: 8 days    Charlies Constable, PA-C 07/13/2019

## 2019-07-13 NOTE — Progress Notes (Signed)
Discharged to home accompanied by friend. Discharge info given to patient by [name], no questions noted. Discharged home via w/c with belongings and equipment. Luke Choi

## 2019-07-13 NOTE — Discharge Instructions (Signed)
Inpatient Rehab Discharge Instructions  [place] Discharge date and time: No discharge date for patient encounter.   Activities/Precautions/ Functional Status: Activity: activity as tolerated Diet: regular diet Wound Care: Change gauze and apply Vaseline to the incision site and wound left leg daily Functional status:  ___ No restrictions     ___ Walk up steps independently ___ 24/7 supervision/assistance   ___ Walk up steps with assistance ___ Intermittent supervision/assistance  ___ Bathe/dress independently ___ Walk with walker     _x__ Bathe/dress with assistance ___ Walk Independently    ___ Shower independently ___ Walk with assistance    ___ Shower with assistance ___ No alcohol     ___ Return to work/school ________          COMMUNITY REFERRALS UPON DISCHARGE:    Home Health:   PT     OT                      Agency:  [facility]    Phone: [phone]  Medical Equipment/Items Ordered:  Rolling walker                                                      Agency/Supplier:  [facility] [phone]   Special Instructions:  No driving smoking or alcohol  My questions have been answered and I understand these instructions. I will adhere to these goals and the provided educational materials after my discharge from the hospital.  Patient/Caregiver Signature _______________________________ Date __________  Clinician Signature _______________________________________ Date __________  Please bring this form and your medication list with you to all your follow-up doctor's appointments. Information on my medicine - ELIQUIS (apixaban)  This medication education was reviewed with me or my healthcare representative as part of my discharge preparation.  The pharmacist that spoke with me during my hospital stay was:  Yujing Z  Steenwyk,, RPH  Why was Eliquis prescribed for you? Eliquis was prescribed to treat blood clots that may have been found in the veins of  your legs (deep vein thrombosis) or in your lungs (pulmonary embolism) and to reduce the risk of them occurring again.  What do You need to know about Eliquis ? The dose is ONE 5 mg tablet taken TWICE daily.  Eliquis may be taken with or without food.   Try to take the dose about the same time in the morning and in the evening. If you have difficulty swallowing the tablet whole please discuss with your pharmacist how to take the medication safely.  Take Eliquis exactly as prescribed and DO NOT stop taking Eliquis without talking to the doctor who prescribed the medication.  Stopping may increase your risk of developing a new blood clot.  Refill your prescription before you run out.  After discharge, you should have regular check-up appointments with your healthcare provider that is prescribing your Eliquis.    What do you do if you miss a dose? If a dose of ELIQUIS is not taken at the scheduled time, take it as soon as possible on the same day and twice-daily administration should be resumed. The dose should not be doubled to make up for a missed dose.  Important Safety Information A possible side effect of Eliquis is bleeding. You should call your healthcare provider right away if you experience any  of the following: ? Bleeding from an injury or your nose that does not stop. ? Unusual colored urine (red or dark brown) or unusual colored stools (red or black). ? Unusual bruising for unknown reasons. ? A serious fall or if you hit your head (even if there is no bleeding).  Some medicines may interact with Eliquis and might increase your risk of bleeding or clotting while on Eliquis. To help avoid this, consult your healthcare provider or pharmacist prior to using any new prescription or non-prescription medications, including herbals, vitamins, non-steroidal anti-inflammatory drugs (NSAIDs) and supplements.  This website has more information on Eliquis (apixaban):  http://www.eliquis.com/eliquis/home .
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
## 2019-07-13 NOTE — Progress Notes (Signed)
Social Work Discharge Note   The overall goal for the admission was met for:   Discharge location: Yes -home with fiance who can provide 24/7 supervision  Length of Stay: Yes - 8 days  Discharge activity level: Yes - supervision overall  Home/community participation: Yes  Services provided included: MD, RD, PT, OT, SLP, RN, Pharmacy, Neuropsych and SW  Financial Services: Private Insurance: [employer] of [place]  Follow-up services arranged: Home Health: PT, OT via [facility], DME: rolling walker via [facility] and Patient/Family has no preference for HH/DME agencies  Comments (or additional information):    Best contact, fiance Benancio Deeds @ [phone]  Patient/Family verbalized understanding of follow-up arrangements: Yes  Individual responsible for coordination of the follow-up plan: pt  Confirmed correct DME delivered: Wardell Pokorski 07/13/2019    Salbador Fiveash

## 2019-07-18 ENCOUNTER — Telehealth: Payer: Self-pay

## 2019-07-18 NOTE — Telephone Encounter (Signed)
  TRANSITIONAL CARE CALL  Appointment Date and Time: 07/20/19 arrive at 9:40 for 10:00am  With: Dr. Naaman Plummer  Questions for our staff to ask patients on Transitional care 48 hour phone call:   1. Are you/is patient experiencing any problems since coming home? NO     Are there any questions regarding any aspect of care? NO  2. Are there any questions regarding medications administration/dosing? NO     Are meds being taken as prescribed? YES  3. Have there been any falls? NO  4. Has Home Health (Home Health: PT, OT via [facility], ) been to the house and/or have they contacted you? YES     If not, have you tried to contact them? NO     Can we help you contact them? NO  5. Are bowels and bladder emptying properly? YES     Are there any unexpected incontinence issues? NA     If applicable, is patient following bowel/bladder programs? NA  6. Any fevers, problems with breathing, unexpected pain? NO  7. Are there any skin problems or new areas of breakdown? NO  8. Has the patient/family member arranged specialty MD follow up (ie cardiology/neurology/renal/surgical/etc)? YES     Can we help arrange? NO  9. Does the patient need any other services or support that we can help arrange? NO  10. Are caregivers following through as expected in assisting the patient? YES  11. Has the patient quit smoking, drinking alcohol, or using drugs as recommended? NO   Physical Medicine and Rehabilitation [address] [phone]

## 2019-07-20 ENCOUNTER — Encounter
Payer: BC Managed Care – PPO | Attending: Physical Medicine & Rehabilitation | Admitting: Physical Medicine & Rehabilitation

## 2019-07-20 ENCOUNTER — Encounter: Payer: Self-pay | Admitting: Physical Medicine & Rehabilitation

## 2019-07-20 ENCOUNTER — Other Ambulatory Visit: Payer: Self-pay

## 2019-07-20 ENCOUNTER — Other Ambulatory Visit: Payer: Self-pay | Admitting: Orthopaedic Surgery

## 2019-07-20 VITALS — BP 108/74 | HR 95 | Temp 97.8°F | Ht 72.0 in | Wt 163.0 lb

## 2019-07-20 DIAGNOSIS — S069X3S Unspecified intracranial injury with loss of consciousness of 1 hour to 5 hours 59 minutes, sequela: Secondary | ICD-10-CM

## 2019-07-20 DIAGNOSIS — S066X0D Traumatic subarachnoid hemorrhage without loss of consciousness, subsequent encounter: Secondary | ICD-10-CM | POA: Diagnosis not present

## 2019-07-20 DIAGNOSIS — S06360D Traumatic hemorrhage of cerebrum, unspecified, without loss of consciousness, subsequent encounter: Secondary | ICD-10-CM | POA: Diagnosis not present

## 2019-07-20 DIAGNOSIS — G4700 Insomnia, unspecified: Secondary | ICD-10-CM | POA: Diagnosis not present

## 2019-07-20 DIAGNOSIS — S8251XD Displaced fracture of medial malleolus of right tibia, subsequent encounter for closed fracture with routine healing: Secondary | ICD-10-CM | POA: Diagnosis not present

## 2019-07-20 DIAGNOSIS — F329 Major depressive disorder, single episode, unspecified: Secondary | ICD-10-CM | POA: Diagnosis not present

## 2019-07-20 DIAGNOSIS — Z7901 Long term (current) use of anticoagulants: Secondary | ICD-10-CM | POA: Diagnosis not present

## 2019-07-20 DIAGNOSIS — S42101D Fracture of unspecified part of scapula, right shoulder, subsequent encounter for fracture with routine healing: Secondary | ICD-10-CM | POA: Diagnosis not present

## 2019-07-20 DIAGNOSIS — S81812D Laceration without foreign body, left lower leg, subsequent encounter: Secondary | ICD-10-CM | POA: Diagnosis not present

## 2019-07-20 DIAGNOSIS — S81812S Laceration without foreign body, left lower leg, sequela: Secondary | ICD-10-CM

## 2019-07-20 DIAGNOSIS — S2241XD Multiple fractures of ribs, right side, subsequent encounter for fracture with routine healing: Secondary | ICD-10-CM | POA: Diagnosis not present

## 2019-07-20 DIAGNOSIS — S060X0A Concussion without loss of consciousness, initial encounter: Secondary | ICD-10-CM | POA: Diagnosis not present

## 2019-07-20 DIAGNOSIS — S72111D Displaced fracture of greater trochanter of right femur, subsequent encounter for closed fracture with routine healing: Secondary | ICD-10-CM | POA: Diagnosis not present

## 2019-07-20 DIAGNOSIS — I10 Essential (primary) hypertension: Secondary | ICD-10-CM | POA: Insufficient documentation

## 2019-07-20 DIAGNOSIS — F1010 Alcohol abuse, uncomplicated: Secondary | ICD-10-CM | POA: Diagnosis not present

## 2019-07-20 DIAGNOSIS — Z930 Tracheostomy status: Secondary | ICD-10-CM | POA: Diagnosis not present

## 2019-07-20 DIAGNOSIS — M21372 Foot drop, left foot: Secondary | ICD-10-CM | POA: Diagnosis not present

## 2019-07-20 MED ORDER — TRAMADOL HCL 50 MG PO TABS: 50.0000 mg | ORAL_TABLET | Freq: Two times a day (BID) | ORAL | 0 refills | Status: DC | PRN

## 2019-07-20 NOTE — Patient Instructions (Signed)
1. DECREASE SEROQUEL TO 50MG  AT NIGHT FOR ONE WEEK THEN STOP.   2. MAY USE TYLENOL, ALLEVE, MOTRIN FOR PAIN   3. CUT METOPROLOL IN HALF AND TAKE TWICE DAILY
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
## 2019-07-21 ENCOUNTER — Telehealth: Payer: Self-pay | Admitting: Orthopaedic Surgery

## 2019-07-21 ENCOUNTER — Encounter: Payer: Self-pay | Admitting: Orthopaedic Surgery

## 2019-07-21 ENCOUNTER — Ambulatory Visit (INDEPENDENT_AMBULATORY_CARE_PROVIDER_SITE_OTHER): Payer: BC Managed Care – PPO | Admitting: Orthopaedic Surgery

## 2019-07-21 ENCOUNTER — Ambulatory Visit (INDEPENDENT_AMBULATORY_CARE_PROVIDER_SITE_OTHER): Payer: BC Managed Care – PPO

## 2019-07-21 DIAGNOSIS — S8251XD Displaced fracture of medial malleolus of right tibia, subsequent encounter for closed fracture with routine healing: Secondary | ICD-10-CM | POA: Diagnosis not present

## 2019-07-21 DIAGNOSIS — S72111D Displaced fracture of greater trochanter of right femur, subsequent encounter for closed fracture with routine healing: Secondary | ICD-10-CM | POA: Diagnosis not present

## 2019-07-21 DIAGNOSIS — S81812D Laceration without foreign body, left lower leg, subsequent encounter: Secondary | ICD-10-CM

## 2019-07-21 DIAGNOSIS — S06360D Traumatic hemorrhage of cerebrum, unspecified, without loss of consciousness, subsequent encounter: Secondary | ICD-10-CM | POA: Diagnosis not present

## 2019-07-21 DIAGNOSIS — S060X0A Concussion without loss of consciousness, initial encounter: Secondary | ICD-10-CM | POA: Diagnosis not present

## 2019-07-21 DIAGNOSIS — M21372 Foot drop, left foot: Secondary | ICD-10-CM | POA: Diagnosis not present

## 2019-07-21 DIAGNOSIS — S066X0D Traumatic subarachnoid hemorrhage without loss of consciousness, subsequent encounter: Secondary | ICD-10-CM | POA: Diagnosis not present

## 2019-07-21 DIAGNOSIS — F1010 Alcohol abuse, uncomplicated: Secondary | ICD-10-CM | POA: Diagnosis not present

## 2019-07-21 DIAGNOSIS — I10 Essential (primary) hypertension: Secondary | ICD-10-CM | POA: Diagnosis not present

## 2019-07-21 DIAGNOSIS — G4700 Insomnia, unspecified: Secondary | ICD-10-CM | POA: Diagnosis not present

## 2019-07-21 DIAGNOSIS — S8251XA Displaced fracture of medial malleolus of right tibia, initial encounter for closed fracture: Secondary | ICD-10-CM

## 2019-07-21 DIAGNOSIS — Z930 Tracheostomy status: Secondary | ICD-10-CM | POA: Diagnosis not present

## 2019-07-21 DIAGNOSIS — Z7901 Long term (current) use of anticoagulants: Secondary | ICD-10-CM | POA: Diagnosis not present

## 2019-07-21 DIAGNOSIS — F329 Major depressive disorder, single episode, unspecified: Secondary | ICD-10-CM | POA: Diagnosis not present

## 2019-07-21 DIAGNOSIS — S42101D Fracture of unspecified part of scapula, right shoulder, subsequent encounter for fracture with routine healing: Secondary | ICD-10-CM | POA: Diagnosis not present

## 2019-07-21 DIAGNOSIS — S2241XD Multiple fractures of ribs, right side, subsequent encounter for fracture with routine healing: Secondary | ICD-10-CM | POA: Diagnosis not present

## 2019-07-21 NOTE — Telephone Encounter (Signed)
No restrictions

## 2019-07-21 NOTE — Telephone Encounter (Signed)
Luke Choi from [place] [facility] home called and wanted to know if any restrictions to right shoulder for stretching and range and motion.
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
## 2019-07-22 ENCOUNTER — Telehealth: Payer: Self-pay | Admitting: *Deleted

## 2019-07-22 ENCOUNTER — Telehealth: Payer: Self-pay

## 2019-07-22 NOTE — Telephone Encounter (Signed)
Joey, PT, HHPT left a message asking for verbal orders for HHPT 1wk1, 2wk1, 1wk1. Medical record reviewed. Social work note reviewed.  Verbal orders given per office protocol.

## 2019-07-22 NOTE — Telephone Encounter (Signed)
Called  Mal[place] to advise on message

## 2019-07-22 NOTE — Telephone Encounter (Signed)
[name] [facility] requesting verbal orders for 1wk3.  Called her back and approved verbal orders,

## 2019-07-27 DIAGNOSIS — I10 Essential (primary) hypertension: Secondary | ICD-10-CM | POA: Diagnosis not present

## 2019-07-27 DIAGNOSIS — S8251XD Displaced fracture of medial malleolus of right tibia, subsequent encounter for closed fracture with routine healing: Secondary | ICD-10-CM | POA: Diagnosis not present

## 2019-07-27 DIAGNOSIS — S81812D Laceration without foreign body, left lower leg, subsequent encounter: Secondary | ICD-10-CM | POA: Diagnosis not present

## 2019-07-27 DIAGNOSIS — Z930 Tracheostomy status: Secondary | ICD-10-CM | POA: Diagnosis not present

## 2019-07-27 DIAGNOSIS — S060X0A Concussion without loss of consciousness, initial encounter: Secondary | ICD-10-CM | POA: Diagnosis not present

## 2019-07-27 DIAGNOSIS — F1010 Alcohol abuse, uncomplicated: Secondary | ICD-10-CM | POA: Diagnosis not present

## 2019-07-27 DIAGNOSIS — G4700 Insomnia, unspecified: Secondary | ICD-10-CM | POA: Diagnosis not present

## 2019-07-27 DIAGNOSIS — Z7901 Long term (current) use of anticoagulants: Secondary | ICD-10-CM | POA: Diagnosis not present

## 2019-07-27 DIAGNOSIS — M21372 Foot drop, left foot: Secondary | ICD-10-CM | POA: Diagnosis not present

## 2019-07-27 DIAGNOSIS — S2241XD Multiple fractures of ribs, right side, subsequent encounter for fracture with routine healing: Secondary | ICD-10-CM | POA: Diagnosis not present

## 2019-07-27 DIAGNOSIS — S066X0D Traumatic subarachnoid hemorrhage without loss of consciousness, subsequent encounter: Secondary | ICD-10-CM | POA: Diagnosis not present

## 2019-07-27 DIAGNOSIS — S72111D Displaced fracture of greater trochanter of right femur, subsequent encounter for closed fracture with routine healing: Secondary | ICD-10-CM | POA: Diagnosis not present

## 2019-07-27 DIAGNOSIS — S42101D Fracture of unspecified part of scapula, right shoulder, subsequent encounter for fracture with routine healing: Secondary | ICD-10-CM | POA: Diagnosis not present

## 2019-07-27 DIAGNOSIS — F329 Major depressive disorder, single episode, unspecified: Secondary | ICD-10-CM | POA: Diagnosis not present

## 2019-07-27 DIAGNOSIS — S06360D Traumatic hemorrhage of cerebrum, unspecified, without loss of consciousness, subsequent encounter: Secondary | ICD-10-CM | POA: Diagnosis not present

## 2019-07-28 ENCOUNTER — Telehealth: Payer: Self-pay | Admitting: *Deleted
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
## 2019-07-28 NOTE — Telephone Encounter (Signed)
[name] called to report that when she saw [name] yesterday he had an elevated resting heart rate of 100-108. Asymptomatic and patient related that some of his medication was decreased. This was an [place].

## 2019-08-02 ENCOUNTER — Telehealth: Payer: Self-pay | Admitting: *Deleted

## 2019-08-02 ENCOUNTER — Telehealth: Payer: Self-pay | Admitting: Registered Nurse

## 2019-08-02 DIAGNOSIS — M21372 Foot drop, left foot: Secondary | ICD-10-CM | POA: Diagnosis not present

## 2019-08-02 DIAGNOSIS — F329 Major depressive disorder, single episode, unspecified: Secondary | ICD-10-CM | POA: Diagnosis not present

## 2019-08-02 DIAGNOSIS — G4700 Insomnia, unspecified: Secondary | ICD-10-CM | POA: Diagnosis not present

## 2019-08-02 DIAGNOSIS — S060X0A Concussion without loss of consciousness, initial encounter: Secondary | ICD-10-CM | POA: Diagnosis not present

## 2019-08-02 DIAGNOSIS — S8251XD Displaced fracture of medial malleolus of right tibia, subsequent encounter for closed fracture with routine healing: Secondary | ICD-10-CM | POA: Diagnosis not present

## 2019-08-02 DIAGNOSIS — Z930 Tracheostomy status: Secondary | ICD-10-CM | POA: Diagnosis not present

## 2019-08-02 DIAGNOSIS — F1010 Alcohol abuse, uncomplicated: Secondary | ICD-10-CM | POA: Diagnosis not present

## 2019-08-02 DIAGNOSIS — S42101D Fracture of unspecified part of scapula, right shoulder, subsequent encounter for fracture with routine healing: Secondary | ICD-10-CM | POA: Diagnosis not present

## 2019-08-02 DIAGNOSIS — S2241XD Multiple fractures of ribs, right side, subsequent encounter for fracture with routine healing: Secondary | ICD-10-CM | POA: Diagnosis not present

## 2019-08-02 DIAGNOSIS — S06360D Traumatic hemorrhage of cerebrum, unspecified, without loss of consciousness, subsequent encounter: Secondary | ICD-10-CM | POA: Diagnosis not present

## 2019-08-02 DIAGNOSIS — S72111D Displaced fracture of greater trochanter of right femur, subsequent encounter for closed fracture with routine healing: Secondary | ICD-10-CM | POA: Diagnosis not present

## 2019-08-02 DIAGNOSIS — S069X3S Unspecified intracranial injury with loss of consciousness of 1 hour to 5 hours 59 minutes, sequela: Secondary | ICD-10-CM

## 2019-08-02 DIAGNOSIS — I10 Essential (primary) hypertension: Secondary | ICD-10-CM | POA: Diagnosis not present

## 2019-08-02 DIAGNOSIS — S81812D Laceration without foreign body, left lower leg, subsequent encounter: Secondary | ICD-10-CM | POA: Diagnosis not present

## 2019-08-02 DIAGNOSIS — S81812S Laceration without foreign body, left lower leg, sequela: Secondary | ICD-10-CM

## 2019-08-02 DIAGNOSIS — S066X0D Traumatic subarachnoid hemorrhage without loss of consciousness, subsequent encounter: Secondary | ICD-10-CM | POA: Diagnosis not present

## 2019-08-02 DIAGNOSIS — Z7901 Long term (current) use of anticoagulants: Secondary | ICD-10-CM | POA: Diagnosis not present

## 2019-08-02 NOTE — Telephone Encounter (Signed)
Placed a call to Ms. [name] regarding her My-Chart message about Mr. Bertelson. No answer left a message for her to call our office and to call his PCP. Awaiting a return call.

## 2019-08-02 NOTE — Telephone Encounter (Signed)
[name]  called to report they were discharging Luke Choi from PT OT and he is ready to transition to outpt rehab.  He would like to go to [facility] rehab in [place].  Referral placed.

## 2019-08-03 ENCOUNTER — Encounter: Payer: Self-pay | Admitting: Primary Care

## 2019-08-03 DIAGNOSIS — S060X0A Concussion without loss of consciousness, initial encounter: Secondary | ICD-10-CM | POA: Diagnosis not present

## 2019-08-03 DIAGNOSIS — I10 Essential (primary) hypertension: Secondary | ICD-10-CM | POA: Diagnosis not present

## 2019-08-03 DIAGNOSIS — S06360D Traumatic hemorrhage of cerebrum, unspecified, without loss of consciousness, subsequent encounter: Secondary | ICD-10-CM | POA: Diagnosis not present

## 2019-08-03 DIAGNOSIS — H53021 Refractive amblyopia, right eye: Secondary | ICD-10-CM | POA: Diagnosis not present

## 2019-08-03 DIAGNOSIS — Z930 Tracheostomy status: Secondary | ICD-10-CM | POA: Diagnosis not present

## 2019-08-03 DIAGNOSIS — S2241XD Multiple fractures of ribs, right side, subsequent encounter for fracture with routine healing: Secondary | ICD-10-CM | POA: Diagnosis not present

## 2019-08-03 DIAGNOSIS — M21372 Foot drop, left foot: Secondary | ICD-10-CM | POA: Diagnosis not present

## 2019-08-03 DIAGNOSIS — F1010 Alcohol abuse, uncomplicated: Secondary | ICD-10-CM | POA: Diagnosis not present

## 2019-08-03 DIAGNOSIS — H40033 Anatomical narrow angle, bilateral: Secondary | ICD-10-CM | POA: Diagnosis not present

## 2019-08-03 DIAGNOSIS — S81812D Laceration without foreign body, left lower leg, subsequent encounter: Secondary | ICD-10-CM | POA: Diagnosis not present

## 2019-08-03 DIAGNOSIS — F329 Major depressive disorder, single episode, unspecified: Secondary | ICD-10-CM | POA: Diagnosis not present

## 2019-08-03 DIAGNOSIS — G4700 Insomnia, unspecified: Secondary | ICD-10-CM | POA: Diagnosis not present

## 2019-08-03 DIAGNOSIS — Z7901 Long term (current) use of anticoagulants: Secondary | ICD-10-CM | POA: Diagnosis not present

## 2019-08-03 DIAGNOSIS — S42101D Fracture of unspecified part of scapula, right shoulder, subsequent encounter for fracture with routine healing: Secondary | ICD-10-CM | POA: Diagnosis not present

## 2019-08-03 DIAGNOSIS — S066X0D Traumatic subarachnoid hemorrhage without loss of consciousness, subsequent encounter: Secondary | ICD-10-CM | POA: Diagnosis not present

## 2019-08-03 DIAGNOSIS — H40013 Open angle with borderline findings, low risk, bilateral: Secondary | ICD-10-CM | POA: Diagnosis not present

## 2019-08-03 DIAGNOSIS — S8251XD Displaced fracture of medial malleolus of right tibia, subsequent encounter for closed fracture with routine healing: Secondary | ICD-10-CM | POA: Diagnosis not present

## 2019-08-03 DIAGNOSIS — S72111D Displaced fracture of greater trochanter of right femur, subsequent encounter for closed fracture with routine healing: Secondary | ICD-10-CM | POA: Diagnosis not present

## 2019-08-04 ENCOUNTER — Other Ambulatory Visit: Payer: Self-pay

## 2019-08-04 ENCOUNTER — Ambulatory Visit: Payer: BC Managed Care – PPO | Admitting: Primary Care

## 2019-08-04 ENCOUNTER — Encounter: Payer: Self-pay | Admitting: Primary Care

## 2019-08-04 VITALS — BP 118/82 | HR 92 | Temp 97.4°F | Wt 172.0 lb

## 2019-08-04 DIAGNOSIS — I10 Essential (primary) hypertension: Secondary | ICD-10-CM

## 2019-08-04 DIAGNOSIS — S069X3S Unspecified intracranial injury with loss of consciousness of 1 hour to 5 hours 59 minutes, sequela: Secondary | ICD-10-CM | POA: Diagnosis not present

## 2019-08-04 DIAGNOSIS — I82409 Acute embolism and thrombosis of unspecified deep veins of unspecified lower extremity: Secondary | ICD-10-CM | POA: Insufficient documentation

## 2019-08-04 DIAGNOSIS — T07XXXA Unspecified multiple injuries, initial encounter: Secondary | ICD-10-CM

## 2019-08-04 DIAGNOSIS — R000 Tachycardia, unspecified: Secondary | ICD-10-CM

## 2019-08-04 DIAGNOSIS — G44309 Post-traumatic headache, unspecified, not intractable: Secondary | ICD-10-CM | POA: Insufficient documentation

## 2019-08-04 DIAGNOSIS — G4700 Insomnia, unspecified: Secondary | ICD-10-CM

## 2019-08-04 DIAGNOSIS — I82519 Chronic embolism and thrombosis of unspecified femoral vein: Secondary | ICD-10-CM

## 2019-08-04 MED ORDER — QUETIAPINE FUMARATE 50 MG PO TABS: 50.0000 mg | ORAL_TABLET | Freq: Every day | ORAL | 0 refills | Status: DC

## 2019-08-04 MED ORDER — METOPROLOL TARTRATE 25 MG PO TABS: 12.5000 mg | ORAL_TABLET | Freq: Two times a day (BID) | ORAL | 0 refills | Status: DC
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
## 2019-08-04 NOTE — Assessment & Plan Note (Signed)
Metoprolol tartrate changed to 25 mg so that he may cut his tablets in half to 12.5 mg and use twice daily.  Blood pressure stable in the office today on this regimen, continue same.
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
## 2019-08-04 NOTE — Patient Instructions (Signed)
Continue Seroquel 50 mg at bedtime for sleep. Message me in about 3 weeks so we can trial a reduction to 25 mg.  Take metoprolol tartrate 12.5 mg BID for tachycardia.  Trial off of Topamax as discussed, update me if headaches return.  Follow up with Dr. Tessa Lerner as scheduled.  It was a pleasure to see you today!

## 2019-08-04 NOTE — Assessment & Plan Note (Signed)
Compliant to Eliquis twice daily. Plan is to continue for another 6 months given size of clot.
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
## 2019-08-04 NOTE — Assessment & Plan Note (Signed)
Rebound insomnia after stopping Seroquel, doing better on 50 mg nightly.  We will have him continue 50 mg nightly for 1 month, consider reducing to 25 mg after that.  Goal is to wean off.  Both he and fianc agree.
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
## 2019-08-08 DIAGNOSIS — H40033 Anatomical narrow angle, bilateral: Secondary | ICD-10-CM | POA: Diagnosis not present

## 2019-08-08 DIAGNOSIS — H25013 Cortical age-related cataract, bilateral: Secondary | ICD-10-CM | POA: Diagnosis not present

## 2019-08-08 DIAGNOSIS — H02883 Meibomian gland dysfunction of right eye, unspecified eyelid: Secondary | ICD-10-CM | POA: Diagnosis not present

## 2019-08-08 DIAGNOSIS — H40013 Open angle with borderline findings, low risk, bilateral: Secondary | ICD-10-CM | POA: Diagnosis not present

## 2019-08-15 DIAGNOSIS — G44329 Chronic post-traumatic headache, not intractable: Secondary | ICD-10-CM

## 2019-08-15 MED ORDER — TOPIRAMATE 25 MG PO TABS: 25.0000 mg | ORAL_TABLET | Freq: Every day | ORAL | 0 refills | Status: DC

## 2019-08-17 NOTE — Telephone Encounter (Signed)
Called pt. Told him to use full 50mg  seroquel at bedtime. topamax needs to be at bedtime also. Also told him not to self-medicate  He'll call me with any problems or questions.

## 2019-08-18 ENCOUNTER — Encounter (HOSPITAL_COMMUNITY): Payer: Self-pay | Admitting: Physical Therapy

## 2019-08-18 ENCOUNTER — Ambulatory Visit (HOSPITAL_COMMUNITY): Payer: BC Managed Care – PPO

## 2019-08-18 ENCOUNTER — Other Ambulatory Visit: Payer: Self-pay

## 2019-08-18 ENCOUNTER — Ambulatory Visit (HOSPITAL_COMMUNITY): Payer: BC Managed Care – PPO | Attending: Physical Medicine & Rehabilitation | Admitting: Physical Therapy

## 2019-08-18 ENCOUNTER — Encounter (HOSPITAL_COMMUNITY): Payer: Self-pay

## 2019-08-18 DIAGNOSIS — R29898 Other symptoms and signs involving the musculoskeletal system: Secondary | ICD-10-CM | POA: Insufficient documentation

## 2019-08-18 DIAGNOSIS — R262 Difficulty in walking, not elsewhere classified: Secondary | ICD-10-CM | POA: Insufficient documentation

## 2019-08-18 DIAGNOSIS — M25611 Stiffness of right shoulder, not elsewhere classified: Secondary | ICD-10-CM

## 2019-08-18 DIAGNOSIS — S069X2S Unspecified intracranial injury with loss of consciousness of 31 minutes to 59 minutes, sequela: Secondary | ICD-10-CM | POA: Insufficient documentation

## 2019-08-18 DIAGNOSIS — M6281 Muscle weakness (generalized): Secondary | ICD-10-CM

## 2019-08-18 NOTE — Patient Instructions (Signed)
scapular retraction  Position hands up against the wall at shoulder level. Keep arms straight and try to squeeze your shoulder blades together. 10-15 times.        (Home) Extension: Isometric / Bilateral Arm Retraction - Sitting   Facing anchor, hold hands and elbow at shoulder height, with elbow bent.  Pull arms back to squeeze shoulder blades together. Repeat 10-15 times. 1-3 times/day.   (Clinic) Extension / Flexion (Assist)   Face anchor, pull arms back, keeping elbow straight, and squeze shoulder blades together. Repeat 10-15 times. 1-3 times/day.   Copyright  VHI. All rights reserved.   (Home) Retraction: Row - Bilateral (Anchor)   Facing anchor, arms reaching forward, pull hands toward stomach, keeping elbows bent and at your sides and pinching shoulder blades together. Repeat 10-15 times. 1-3 times/day.   Copyright  VHI. All rights reserved.
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
## 2019-08-19 NOTE — Telephone Encounter (Signed)
I gave Luke Choi clearance to drive to therapies only

## 2019-08-22 ENCOUNTER — Other Ambulatory Visit: Payer: Self-pay

## 2019-08-22 DIAGNOSIS — I10 Essential (primary) hypertension: Secondary | ICD-10-CM

## 2019-08-22 DIAGNOSIS — S81812S Laceration without foreign body, left lower leg, sequela: Secondary | ICD-10-CM

## 2019-08-22 DIAGNOSIS — S069X3S Unspecified intracranial injury with loss of consciousness of 1 hour to 5 hours 59 minutes, sequela: Secondary | ICD-10-CM

## 2019-08-23 ENCOUNTER — Other Ambulatory Visit: Payer: Self-pay

## 2019-08-23 ENCOUNTER — Ambulatory Visit (HOSPITAL_COMMUNITY): Payer: BC Managed Care – PPO | Admitting: Physical Therapy

## 2019-08-23 DIAGNOSIS — R262 Difficulty in walking, not elsewhere classified: Secondary | ICD-10-CM | POA: Diagnosis not present

## 2019-08-23 DIAGNOSIS — R29898 Other symptoms and signs involving the musculoskeletal system: Secondary | ICD-10-CM | POA: Diagnosis not present

## 2019-08-23 DIAGNOSIS — M6281 Muscle weakness (generalized): Secondary | ICD-10-CM

## 2019-08-23 DIAGNOSIS — S069X2S Unspecified intracranial injury with loss of consciousness of 31 minutes to 59 minutes, sequela: Secondary | ICD-10-CM

## 2019-08-23 DIAGNOSIS — M25611 Stiffness of right shoulder, not elsewhere classified: Secondary | ICD-10-CM | POA: Diagnosis not present

## 2019-08-23 MED ORDER — QUETIAPINE FUMARATE 100 MG PO TABS: 100.0000 mg | ORAL_TABLET | Freq: Every day | ORAL | 2 refills | Status: DC

## 2019-08-23 MED ORDER — CITALOPRAM HYDROBROMIDE 10 MG PO TABS: 10.0000 mg | ORAL_TABLET | Freq: Every day | ORAL | 2 refills | Status: DC

## 2019-08-23 MED ORDER — TRAMADOL HCL 50 MG PO TABS: 50.0000 mg | ORAL_TABLET | Freq: Two times a day (BID) | ORAL | 0 refills | Status: DC | PRN

## 2019-08-23 NOTE — Telephone Encounter (Signed)
Last clinic note indicated wean down

## 2019-08-23 NOTE — Telephone Encounter (Signed)
Increased seroquel to 100mg , started celexa 10mg  qhs, refilled tramadol this time
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
## 2019-08-25 ENCOUNTER — Encounter (HOSPITAL_COMMUNITY): Payer: Self-pay | Admitting: Occupational Therapy

## 2019-08-25 ENCOUNTER — Other Ambulatory Visit: Payer: Self-pay

## 2019-08-25 ENCOUNTER — Ambulatory Visit (HOSPITAL_COMMUNITY): Payer: BC Managed Care – PPO | Admitting: Occupational Therapy

## 2019-08-25 DIAGNOSIS — R29898 Other symptoms and signs involving the musculoskeletal system: Secondary | ICD-10-CM | POA: Diagnosis not present

## 2019-08-25 DIAGNOSIS — M25611 Stiffness of right shoulder, not elsewhere classified: Secondary | ICD-10-CM

## 2019-08-25 DIAGNOSIS — S069X2S Unspecified intracranial injury with loss of consciousness of 31 minutes to 59 minutes, sequela: Secondary | ICD-10-CM | POA: Diagnosis not present

## 2019-08-25 DIAGNOSIS — R262 Difficulty in walking, not elsewhere classified: Secondary | ICD-10-CM | POA: Diagnosis not present

## 2019-08-25 DIAGNOSIS — M6281 Muscle weakness (generalized): Secondary | ICD-10-CM | POA: Diagnosis not present
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
## 2019-08-26 ENCOUNTER — Other Ambulatory Visit: Payer: Self-pay

## 2019-08-26 ENCOUNTER — Ambulatory Visit (HOSPITAL_COMMUNITY): Payer: BC Managed Care – PPO | Admitting: Physical Therapy

## 2019-08-26 ENCOUNTER — Encounter (HOSPITAL_COMMUNITY): Payer: Self-pay | Admitting: Physical Therapy

## 2019-08-26 DIAGNOSIS — R29898 Other symptoms and signs involving the musculoskeletal system: Secondary | ICD-10-CM | POA: Diagnosis not present

## 2019-08-26 DIAGNOSIS — M25611 Stiffness of right shoulder, not elsewhere classified: Secondary | ICD-10-CM | POA: Diagnosis not present

## 2019-08-26 DIAGNOSIS — S069X2S Unspecified intracranial injury with loss of consciousness of 31 minutes to 59 minutes, sequela: Secondary | ICD-10-CM | POA: Diagnosis not present

## 2019-08-26 DIAGNOSIS — M6281 Muscle weakness (generalized): Secondary | ICD-10-CM

## 2019-08-26 DIAGNOSIS — R262 Difficulty in walking, not elsewhere classified: Secondary | ICD-10-CM | POA: Diagnosis not present
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
## 2019-08-29 ENCOUNTER — Other Ambulatory Visit: Payer: Self-pay

## 2019-08-29 DIAGNOSIS — G44329 Chronic post-traumatic headache, not intractable: Secondary | ICD-10-CM

## 2019-08-29 MED ORDER — TOPIRAMATE 25 MG PO TABS: 25.0000 mg | ORAL_TABLET | Freq: Every day | ORAL | 0 refills | Status: DC

## 2019-08-29 NOTE — Telephone Encounter (Signed)
I called the patient and told him he may increase topamax to 25mg  bid for his ongoing headaches.

## 2019-08-30 ENCOUNTER — Other Ambulatory Visit: Payer: Self-pay | Admitting: *Deleted

## 2019-08-30 ENCOUNTER — Telehealth (HOSPITAL_COMMUNITY): Payer: Self-pay

## 2019-08-30 DIAGNOSIS — G44329 Chronic post-traumatic headache, not intractable: Secondary | ICD-10-CM

## 2019-08-30 MED ORDER — TOPIRAMATE 25 MG PO TABS: 25.0000 mg | ORAL_TABLET | Freq: Two times a day (BID) | ORAL | 0 refills | Status: DC

## 2019-08-30 NOTE — Telephone Encounter (Signed)
Pt understand  apptment OT for 1/20 is cx due to LE out of the office. 

## 2019-08-31 ENCOUNTER — Ambulatory Visit (HOSPITAL_COMMUNITY): Payer: BC Managed Care – PPO | Admitting: Physical Therapy

## 2019-08-31 ENCOUNTER — Other Ambulatory Visit: Payer: Self-pay

## 2019-08-31 ENCOUNTER — Encounter (HOSPITAL_COMMUNITY): Payer: BC Managed Care – PPO

## 2019-08-31 ENCOUNTER — Encounter (HOSPITAL_COMMUNITY): Payer: Self-pay | Admitting: Physical Therapy

## 2019-08-31 DIAGNOSIS — M6281 Muscle weakness (generalized): Secondary | ICD-10-CM

## 2019-08-31 DIAGNOSIS — S069X2S Unspecified intracranial injury with loss of consciousness of 31 minutes to 59 minutes, sequela: Secondary | ICD-10-CM

## 2019-08-31 DIAGNOSIS — M25611 Stiffness of right shoulder, not elsewhere classified: Secondary | ICD-10-CM | POA: Diagnosis not present

## 2019-08-31 DIAGNOSIS — R262 Difficulty in walking, not elsewhere classified: Secondary | ICD-10-CM | POA: Diagnosis not present

## 2019-08-31 DIAGNOSIS — R29898 Other symptoms and signs involving the musculoskeletal system: Secondary | ICD-10-CM
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
## 2019-09-01 ENCOUNTER — Other Ambulatory Visit: Payer: Self-pay

## 2019-09-01 ENCOUNTER — Ambulatory Visit (HOSPITAL_COMMUNITY): Payer: BC Managed Care – PPO

## 2019-09-01 ENCOUNTER — Ambulatory Visit (HOSPITAL_COMMUNITY): Payer: BC Managed Care – PPO | Admitting: Physical Therapy

## 2019-09-01 ENCOUNTER — Encounter (HOSPITAL_COMMUNITY): Payer: Self-pay | Admitting: Physical Therapy

## 2019-09-01 ENCOUNTER — Encounter (HOSPITAL_COMMUNITY): Payer: Self-pay

## 2019-09-01 DIAGNOSIS — S069X2S Unspecified intracranial injury with loss of consciousness of 31 minutes to 59 minutes, sequela: Secondary | ICD-10-CM

## 2019-09-01 DIAGNOSIS — R262 Difficulty in walking, not elsewhere classified: Secondary | ICD-10-CM

## 2019-09-01 DIAGNOSIS — M6281 Muscle weakness (generalized): Secondary | ICD-10-CM | POA: Diagnosis not present

## 2019-09-01 DIAGNOSIS — M25611 Stiffness of right shoulder, not elsewhere classified: Secondary | ICD-10-CM | POA: Diagnosis not present

## 2019-09-01 DIAGNOSIS — R29898 Other symptoms and signs involving the musculoskeletal system: Secondary | ICD-10-CM
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
## 2019-09-01 NOTE — Patient Instructions (Signed)
Access Code: [email or web address]  URL: [email or web address]  Date: 09/01/2019  Prepared by: Greig Castilla Bernerd Terhune   Exercises Single Leg Stance - 5 reps - 30 second hold - 1x daily - 7x weekly Standing Tandem Balance with Counter Support - 5 reps - 30 second holds hold - 1x daily - 7x weekly
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
## 2019-09-04 ENCOUNTER — Other Ambulatory Visit: Payer: Self-pay | Admitting: Primary Care

## 2019-09-04 DIAGNOSIS — R000 Tachycardia, unspecified: Secondary | ICD-10-CM

## 2019-09-04 DIAGNOSIS — G4700 Insomnia, unspecified: Secondary | ICD-10-CM

## 2019-09-04 DIAGNOSIS — I10 Essential (primary) hypertension: Secondary | ICD-10-CM

## 2019-09-06 ENCOUNTER — Ambulatory Visit (HOSPITAL_COMMUNITY): Payer: BC Managed Care – PPO | Admitting: Occupational Therapy

## 2019-09-06 ENCOUNTER — Other Ambulatory Visit: Payer: Self-pay

## 2019-09-06 ENCOUNTER — Encounter (HOSPITAL_COMMUNITY): Payer: Self-pay | Admitting: Physical Therapy

## 2019-09-06 ENCOUNTER — Ambulatory Visit (HOSPITAL_COMMUNITY): Payer: BC Managed Care – PPO | Admitting: Physical Therapy

## 2019-09-06 ENCOUNTER — Encounter (HOSPITAL_COMMUNITY): Payer: Self-pay | Admitting: Occupational Therapy

## 2019-09-06 DIAGNOSIS — M6281 Muscle weakness (generalized): Secondary | ICD-10-CM | POA: Diagnosis not present

## 2019-09-06 DIAGNOSIS — S069X2S Unspecified intracranial injury with loss of consciousness of 31 minutes to 59 minutes, sequela: Secondary | ICD-10-CM | POA: Diagnosis not present

## 2019-09-06 DIAGNOSIS — R29898 Other symptoms and signs involving the musculoskeletal system: Secondary | ICD-10-CM | POA: Diagnosis not present

## 2019-09-06 DIAGNOSIS — R262 Difficulty in walking, not elsewhere classified: Secondary | ICD-10-CM | POA: Diagnosis not present

## 2019-09-06 DIAGNOSIS — M25611 Stiffness of right shoulder, not elsewhere classified: Secondary | ICD-10-CM | POA: Diagnosis not present
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
## 2019-09-09 ENCOUNTER — Ambulatory Visit (HOSPITAL_COMMUNITY): Payer: BC Managed Care – PPO | Admitting: Physical Therapy

## 2019-09-09 ENCOUNTER — Encounter (HOSPITAL_COMMUNITY): Payer: Self-pay | Admitting: Physical Therapy

## 2019-09-09 ENCOUNTER — Encounter (HOSPITAL_COMMUNITY): Payer: Self-pay

## 2019-09-09 ENCOUNTER — Ambulatory Visit (HOSPITAL_COMMUNITY): Payer: BC Managed Care – PPO

## 2019-09-09 ENCOUNTER — Other Ambulatory Visit: Payer: Self-pay

## 2019-09-09 DIAGNOSIS — M6281 Muscle weakness (generalized): Secondary | ICD-10-CM | POA: Diagnosis not present

## 2019-09-09 DIAGNOSIS — S069X2S Unspecified intracranial injury with loss of consciousness of 31 minutes to 59 minutes, sequela: Secondary | ICD-10-CM | POA: Diagnosis not present

## 2019-09-09 DIAGNOSIS — R262 Difficulty in walking, not elsewhere classified: Secondary | ICD-10-CM | POA: Diagnosis not present

## 2019-09-09 DIAGNOSIS — M25611 Stiffness of right shoulder, not elsewhere classified: Secondary | ICD-10-CM

## 2019-09-09 DIAGNOSIS — R29898 Other symptoms and signs involving the musculoskeletal system: Secondary | ICD-10-CM
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
## 2019-09-09 NOTE — Patient Instructions (Signed)
Access Code: [email or web address]  URL: [email or web address]  Date: 09/09/2019  Prepared by: Aniceto Kyser   Exercises Squat with Counter Support - 10 reps - 3 sets - 1x daily - 7x weekly 
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
## 2019-09-09 NOTE — Patient Instructions (Signed)
Complete the following exercises before and after your arm strengthening exercises.   Doorway Stretch  Place each hand opposite each other on the doorway. (You can change where you feel the stretch by moving arms higher or lower.) Step through with one foot and bend front knee until a stretch is felt and hold. Step through with the opposite foot on the next rep. Hold for __20___ seconds. Repeat __2__times.       Wall Flexion  Slide your arm up the wall or door frame until a stretch is felt in your shoulder . Hold for 20 seconds. Complete 2 times     Shoulder Abduction Stretch  Stand side ways by a wall with affected up on wall. Gently step in toward wall to feel stretch. Hold for 20 seconds. Complete 2 times.

## 2019-09-14 ENCOUNTER — Encounter
Payer: BC Managed Care – PPO | Attending: Physical Medicine & Rehabilitation | Admitting: Physical Medicine & Rehabilitation

## 2019-09-14 ENCOUNTER — Other Ambulatory Visit: Payer: Self-pay

## 2019-09-14 ENCOUNTER — Encounter: Payer: Self-pay | Admitting: Physical Medicine & Rehabilitation

## 2019-09-14 ENCOUNTER — Ambulatory Visit (HOSPITAL_COMMUNITY): Payer: BC Managed Care – PPO

## 2019-09-14 ENCOUNTER — Telehealth (HOSPITAL_COMMUNITY): Payer: Self-pay | Admitting: Physical Therapy

## 2019-09-14 ENCOUNTER — Ambulatory Visit (HOSPITAL_COMMUNITY): Payer: BC Managed Care – PPO | Admitting: Physical Therapy

## 2019-09-14 VITALS — BP 116/68 | HR 82 | Temp 97.9°F | Ht 72.0 in | Wt 182.0 lb

## 2019-09-14 DIAGNOSIS — G44329 Chronic post-traumatic headache, not intractable: Secondary | ICD-10-CM | POA: Diagnosis not present

## 2019-09-14 DIAGNOSIS — S81812S Laceration without foreign body, left lower leg, sequela: Secondary | ICD-10-CM | POA: Diagnosis not present

## 2019-09-14 DIAGNOSIS — T07XXXA Unspecified multiple injuries, initial encounter: Secondary | ICD-10-CM

## 2019-09-14 DIAGNOSIS — I10 Essential (primary) hypertension: Secondary | ICD-10-CM | POA: Diagnosis not present

## 2019-09-14 DIAGNOSIS — S069X3S Unspecified intracranial injury with loss of consciousness of 1 hour to 5 hours 59 minutes, sequela: Secondary | ICD-10-CM | POA: Insufficient documentation

## 2019-09-14 DIAGNOSIS — I82511 Chronic embolism and thrombosis of right femoral vein: Secondary | ICD-10-CM | POA: Insufficient documentation

## 2019-09-14 MED ORDER — APIXABAN 5 MG PO TABS: 5.0000 mg | ORAL_TABLET | Freq: Two times a day (BID) | ORAL | 3 refills | Status: DC

## 2019-09-14 NOTE — Telephone Encounter (Signed)
pt had a family emergency come up and can not make it today
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
## 2019-09-14 NOTE — Patient Instructions (Signed)
You may drive during the day time  COVID-19 Vaccine Information can be found at: [email or web address] For questions related to vaccine distribution or appointments, please email [email or web address] or call [phone].

## 2019-09-15 ENCOUNTER — Ambulatory Visit (HOSPITAL_COMMUNITY): Payer: BC Managed Care – PPO | Attending: Physical Medicine & Rehabilitation | Admitting: Physical Therapy

## 2019-09-15 ENCOUNTER — Encounter (HOSPITAL_COMMUNITY): Payer: Self-pay | Admitting: Physical Therapy

## 2019-09-15 ENCOUNTER — Encounter (HOSPITAL_COMMUNITY): Payer: BC Managed Care – PPO

## 2019-09-15 ENCOUNTER — Other Ambulatory Visit: Payer: Self-pay

## 2019-09-15 DIAGNOSIS — M6281 Muscle weakness (generalized): Secondary | ICD-10-CM | POA: Insufficient documentation

## 2019-09-15 DIAGNOSIS — R29898 Other symptoms and signs involving the musculoskeletal system: Secondary | ICD-10-CM | POA: Diagnosis not present

## 2019-09-15 DIAGNOSIS — S069X2S Unspecified intracranial injury with loss of consciousness of 31 minutes to 59 minutes, sequela: Secondary | ICD-10-CM

## 2019-09-15 DIAGNOSIS — R262 Difficulty in walking, not elsewhere classified: Secondary | ICD-10-CM | POA: Insufficient documentation
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
## 2019-09-19 ENCOUNTER — Encounter (HOSPITAL_COMMUNITY): Payer: Self-pay | Admitting: Physical Therapy

## 2019-09-19 ENCOUNTER — Other Ambulatory Visit: Payer: Self-pay

## 2019-09-19 ENCOUNTER — Encounter (HOSPITAL_COMMUNITY): Payer: BC Managed Care – PPO

## 2019-09-19 ENCOUNTER — Ambulatory Visit (HOSPITAL_COMMUNITY): Payer: BC Managed Care – PPO | Admitting: Physical Therapy

## 2019-09-19 DIAGNOSIS — R262 Difficulty in walking, not elsewhere classified: Secondary | ICD-10-CM

## 2019-09-19 DIAGNOSIS — R29898 Other symptoms and signs involving the musculoskeletal system: Secondary | ICD-10-CM

## 2019-09-19 DIAGNOSIS — M6281 Muscle weakness (generalized): Secondary | ICD-10-CM | POA: Diagnosis not present

## 2019-09-19 DIAGNOSIS — S069X2S Unspecified intracranial injury with loss of consciousness of 31 minutes to 59 minutes, sequela: Secondary | ICD-10-CM | POA: Diagnosis not present

## 2019-09-19 NOTE — Telephone Encounter (Signed)
Message from patient

## 2019-09-19 NOTE — Telephone Encounter (Signed)
Let Camren know I wrote the letter. If someone could print it out for me, I'll sign it at the office on Wednesday morning, and he can pick it up.   Thanks z
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
## 2019-09-23 ENCOUNTER — Other Ambulatory Visit: Payer: Self-pay

## 2019-09-23 ENCOUNTER — Encounter (HOSPITAL_COMMUNITY): Payer: Self-pay | Admitting: Physical Therapy

## 2019-09-23 ENCOUNTER — Ambulatory Visit (HOSPITAL_COMMUNITY): Payer: BC Managed Care – PPO | Admitting: Physical Therapy

## 2019-09-23 DIAGNOSIS — R29898 Other symptoms and signs involving the musculoskeletal system: Secondary | ICD-10-CM

## 2019-09-23 DIAGNOSIS — R262 Difficulty in walking, not elsewhere classified: Secondary | ICD-10-CM

## 2019-09-23 DIAGNOSIS — S069X2S Unspecified intracranial injury with loss of consciousness of 31 minutes to 59 minutes, sequela: Secondary | ICD-10-CM | POA: Diagnosis not present

## 2019-09-23 DIAGNOSIS — M6281 Muscle weakness (generalized): Secondary | ICD-10-CM
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
## 2019-09-28 ENCOUNTER — Ambulatory Visit (HOSPITAL_COMMUNITY): Payer: BC Managed Care – PPO | Admitting: Physical Therapy

## 2019-09-28 NOTE — Telephone Encounter (Signed)
Letter rewritten for pt.

## 2019-09-28 NOTE — Telephone Encounter (Signed)
Message from patient

## 2019-09-28 NOTE — Telephone Encounter (Signed)
Request from patient 

## 2019-09-29 ENCOUNTER — Ambulatory Visit (HOSPITAL_COMMUNITY): Payer: BC Managed Care – PPO | Admitting: Physical Therapy

## 2019-10-03 ENCOUNTER — Ambulatory Visit (HOSPITAL_COMMUNITY): Payer: BC Managed Care – PPO | Admitting: Physical Therapy

## 2019-10-03 ENCOUNTER — Other Ambulatory Visit: Payer: Self-pay | Admitting: Primary Care

## 2019-10-03 DIAGNOSIS — R000 Tachycardia, unspecified: Secondary | ICD-10-CM

## 2019-10-03 DIAGNOSIS — I10 Essential (primary) hypertension: Secondary | ICD-10-CM

## 2019-10-03 NOTE — Telephone Encounter (Signed)
Can we follow up on the below? Pt says he's heard nothing about scheduling the procedure. Please let pt know also. Thanks!  VAS [place] LOWER EXTREMITY VENOUS (DVT) (Order [id number]) Vascular Ultrasound Date: 09/14/2019 Department: [facility] Physical Medicine and Rehabilitation Ordering/Authorizing: Ranelle Oyster, MD  Linked Results  Procedure Abnormality Status  VAS [place] LOWER EXTREMITY VENOUS (DVT)    Order Information  Order Date/Time Release Date/Time Start Date/Time End Date/Time  09/14/19 10:24 AM None 09/14/2019 None

## 2019-10-03 NOTE — Telephone Encounter (Signed)
Message from patient

## 2019-10-04 ENCOUNTER — Ambulatory Visit (HOSPITAL_COMMUNITY)
Admission: RE | Admit: 2019-10-04 | Discharge: 2019-10-04 | Disposition: A | Discharge status: Home / Self Care | Payer: BC Managed Care – PPO | Source: Ambulatory Visit | Attending: Physical Medicine & Rehabilitation | Admitting: Physical Medicine & Rehabilitation

## 2019-10-04 ENCOUNTER — Other Ambulatory Visit: Payer: Self-pay

## 2019-10-04 DIAGNOSIS — I82511 Chronic embolism and thrombosis of right femoral vein: Secondary | ICD-10-CM

## 2019-10-04 NOTE — Progress Notes (Signed)
Right lower extremity venous duplex exam completed.  Preliminary results can be found under CV proc under chart review.  10/04/2019 3:29 PM  Lesha Jager, K., RDMS, RVT

## 2019-10-06 DIAGNOSIS — N529 Male erectile dysfunction, unspecified: Secondary | ICD-10-CM

## 2019-10-07 ENCOUNTER — Ambulatory Visit (HOSPITAL_COMMUNITY): Payer: BC Managed Care – PPO | Admitting: Physical Therapy

## 2019-10-08 MED ORDER — SILDENAFIL CITRATE 50 MG PO TABS: 50.0000 mg | ORAL_TABLET | ORAL | 1 refills | Status: DC | PRN

## 2019-10-08 NOTE — Telephone Encounter (Signed)
viagra rx'ed d/t patients complaints of erectile dysfunction

## 2019-10-14 ENCOUNTER — Other Ambulatory Visit: Payer: Self-pay | Admitting: Physical Medicine & Rehabilitation

## 2019-10-14 DIAGNOSIS — I10 Essential (primary) hypertension: Secondary | ICD-10-CM

## 2019-10-14 DIAGNOSIS — S069X3S Unspecified intracranial injury with loss of consciousness of 1 hour to 5 hours 59 minutes, sequela: Secondary | ICD-10-CM

## 2019-10-14 DIAGNOSIS — S81812S Laceration without foreign body, left lower leg, sequela: Secondary | ICD-10-CM

## 2019-10-14 NOTE — Telephone Encounter (Signed)
Due to recent change in pharmacy policy. Medication must now be E-scribed to pharmacy and cannot be called in due to it being a controlled substance.

## 2019-10-14 NOTE — Telephone Encounter (Signed)
Med refilled  thanks

## 2019-11-09 ENCOUNTER — Other Ambulatory Visit: Payer: Self-pay

## 2019-11-09 ENCOUNTER — Encounter
Payer: BC Managed Care – PPO | Attending: Physical Medicine & Rehabilitation | Admitting: Physical Medicine & Rehabilitation

## 2019-11-09 ENCOUNTER — Encounter: Payer: Self-pay | Admitting: Physical Medicine & Rehabilitation

## 2019-11-09 VITALS — BP 114/67 | HR 78 | Temp 97.7°F | Ht 72.0 in | Wt 184.0 lb

## 2019-11-09 DIAGNOSIS — S069X3S Unspecified intracranial injury with loss of consciousness of 1 hour to 5 hours 59 minutes, sequela: Secondary | ICD-10-CM | POA: Diagnosis not present

## 2019-11-09 DIAGNOSIS — I10 Essential (primary) hypertension: Secondary | ICD-10-CM | POA: Diagnosis not present

## 2019-11-09 DIAGNOSIS — S069X4S Unspecified intracranial injury with loss of consciousness of 6 hours to 24 hours, sequela: Secondary | ICD-10-CM

## 2019-11-09 DIAGNOSIS — I82511 Chronic embolism and thrombosis of right femoral vein: Secondary | ICD-10-CM | POA: Insufficient documentation

## 2019-11-09 DIAGNOSIS — S81812S Laceration without foreign body, left lower leg, sequela: Secondary | ICD-10-CM | POA: Insufficient documentation

## 2019-11-09 DIAGNOSIS — G44329 Chronic post-traumatic headache, not intractable: Secondary | ICD-10-CM | POA: Diagnosis not present

## 2019-11-09 NOTE — Patient Instructions (Signed)
1. WEAR A SPLINT AT NIGHT TIME TO START. IF THAT DOESN'T HELP, YOU MAY NEED TO WEAR DURING THE DAY ALSO.     2. WORK ON THE WRIST EXERCISES I PROVIDED
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
## 2019-11-10 ENCOUNTER — Ambulatory Visit: Payer: BC Managed Care – PPO | Attending: Internal Medicine

## 2019-11-10 DIAGNOSIS — Z23 Encounter for immunization: Secondary | ICD-10-CM

## 2019-11-10 NOTE — Progress Notes (Signed)
   Covid-19 Vaccination Clinic  Name:  Tahj Njoku    MRN: [id number] DOB: 1971-04-12  11/10/2019  Mr. Rafalski was observed post Covid-19 immunization for 15 minutes without incident. He was provided with Vaccine Information Sheet and instruction to access the V-Safe system.   Mr. Ostrom was instructed to call 911 with any severe reactions post vaccine: Marland Kitchen Difficulty breathing  . Swelling of face and throat  . A fast heartbeat  . A bad rash all over body  . Dizziness and weakness   Immunizations Administered    Name Date Dose VIS Date Route   Moderna COVID-19 Vaccine 11/10/2019 12:08 PM 0.5 mL 07/12/2019 Intramuscular   Manufacturer: Moderna   Lot: [id number]   NDC: 27062-376-28

## 2019-11-12 ENCOUNTER — Other Ambulatory Visit: Payer: Self-pay | Admitting: Physical Medicine & Rehabilitation

## 2019-11-14 ENCOUNTER — Ambulatory Visit: Payer: BC Managed Care – PPO | Admitting: Internal Medicine

## 2019-11-14 ENCOUNTER — Other Ambulatory Visit: Payer: Self-pay

## 2019-11-14 ENCOUNTER — Encounter: Payer: Self-pay | Admitting: Internal Medicine

## 2019-11-14 VITALS — BP 114/68 | HR 72 | Temp 97.5°F | Ht 72.0 in | Wt 191.0 lb

## 2019-11-14 DIAGNOSIS — R002 Palpitations: Secondary | ICD-10-CM | POA: Diagnosis not present

## 2019-11-14 DIAGNOSIS — I10 Essential (primary) hypertension: Secondary | ICD-10-CM

## 2019-11-14 NOTE — Patient Instructions (Signed)
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
## 2019-11-18 ENCOUNTER — Other Ambulatory Visit: Payer: Self-pay | Admitting: Physical Medicine & Rehabilitation

## 2019-11-25 ENCOUNTER — Other Ambulatory Visit: Payer: Self-pay | Admitting: Physical Medicine & Rehabilitation

## 2019-11-25 DIAGNOSIS — G44329 Chronic post-traumatic headache, not intractable: Secondary | ICD-10-CM

## 2019-12-13 ENCOUNTER — Ambulatory Visit: Payer: BC Managed Care – PPO | Attending: Internal Medicine

## 2019-12-13 DIAGNOSIS — Z23 Encounter for immunization: Secondary | ICD-10-CM

## 2019-12-13 NOTE — Progress Notes (Signed)
   Covid-19 Vaccination Clinic  Name:  Luke Choi    MRN: [id number] DOB: June 25, 1971  12/13/2019  [name] was observed post Covid-19 immunization for 15 minutes without incident. He was provided with Vaccine Information Sheet and instruction to access the V-Safe system.   [name] was instructed to call 911 with any severe reactions post vaccine: Marland Kitchen Difficulty breathing  . Swelling of face and throat  . A fast heartbeat  . A bad rash all over body  . Dizziness and weakness   Immunizations Administered    Name Date Dose VIS Date Route   Moderna COVID-19 Vaccine 12/13/2019 12:08 PM 0.5 mL 07/2019 Intramuscular   Manufacturer: Moderna   Lot: [id number]   NDC: 82641-583-09

## 2019-12-14 ENCOUNTER — Ambulatory Visit: Payer: BC Managed Care – PPO | Admitting: Physical Medicine & Rehabilitation

## 2020-01-04 ENCOUNTER — Other Ambulatory Visit: Payer: Self-pay | Admitting: Physical Medicine & Rehabilitation

## 2020-01-04 DIAGNOSIS — I82511 Chronic embolism and thrombosis of right femoral vein: Secondary | ICD-10-CM
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
## 2020-01-25 IMAGING — CR DG ABD PORTABLE 1V
1 series · 1 of 1 positions shown · non-contrast
Comparison: Abdominal radiograph 04/28/2019

CLINICAL DATA: Encounter for nasogastric (NG) tube placement

EXAM:
PORTABLE ABDOMEN - 1 VIEW

[AP]
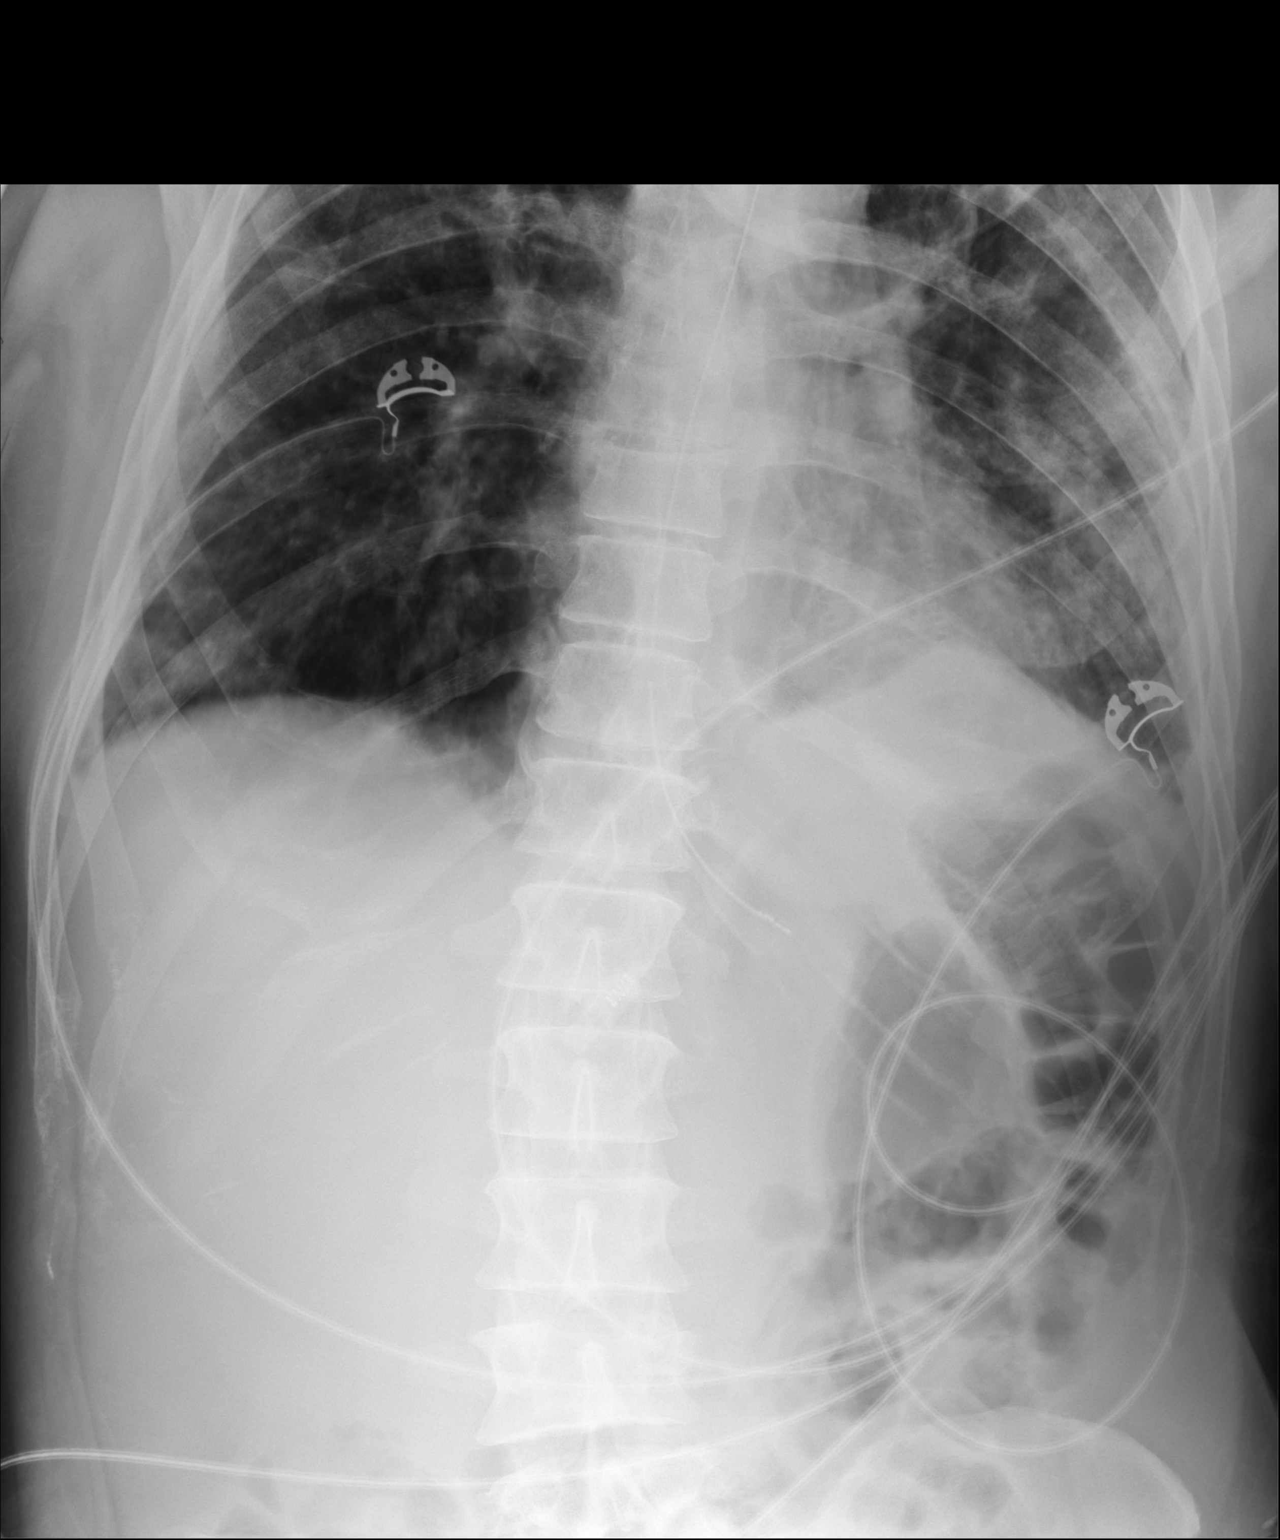

[1 of 1 positions shown; findings below may reference images not displayed]

FINDINGS: The nasogastric tube side port projects in the distal esophagus. The
abdomen is partially visualized without evidence of obstruction. No
evidence of free air. Diffuse opacities throughout the bilateral
lungs.
IMPRESSION: Nasogastric tube side port projects in the distal esophagus.
Recommend advancement of approximately 9 cm.

## 2020-02-08 ENCOUNTER — Other Ambulatory Visit: Payer: Self-pay

## 2020-02-08 ENCOUNTER — Encounter: Payer: Self-pay | Admitting: Physical Medicine & Rehabilitation

## 2020-02-08 ENCOUNTER — Encounter
Payer: BC Managed Care – PPO | Attending: Physical Medicine & Rehabilitation | Admitting: Physical Medicine & Rehabilitation

## 2020-02-08 VITALS — BP 115/71 | HR 61 | Temp 97.8°F | Ht 72.0 in | Wt 195.0 lb

## 2020-02-08 DIAGNOSIS — S069X0S Unspecified intracranial injury without loss of consciousness, sequela: Secondary | ICD-10-CM | POA: Diagnosis not present

## 2020-02-08 DIAGNOSIS — G44329 Chronic post-traumatic headache, not intractable: Secondary | ICD-10-CM | POA: Insufficient documentation

## 2020-02-08 DIAGNOSIS — S069X3S Unspecified intracranial injury with loss of consciousness of 1 hour to 5 hours 59 minutes, sequela: Secondary | ICD-10-CM | POA: Insufficient documentation

## 2020-02-08 DIAGNOSIS — F068 Other specified mental disorders due to known physiological condition: Secondary | ICD-10-CM

## 2020-02-08 DIAGNOSIS — S069XAS Unspecified intracranial injury with loss of consciousness status unknown, sequela: Secondary | ICD-10-CM | POA: Insufficient documentation

## 2020-02-08 MED ORDER — METHYLPHENIDATE HCL ER (OSM) 18 MG PO TBCR: 18.0000 mg | EXTENDED_RELEASE_TABLET | Freq: Every day | ORAL | 0 refills | Status: DC

## 2020-02-08 NOTE — Patient Instructions (Signed)
PLEASE FEEL FREE TO CALL OUR OFFICE WITH ANY PROBLEMS OR QUESTIONS ([phone])      
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
## 2020-02-17 ENCOUNTER — Other Ambulatory Visit: Payer: Self-pay | Admitting: Physical Medicine & Rehabilitation

## 2020-02-20 ENCOUNTER — Other Ambulatory Visit: Payer: Self-pay | Admitting: Physical Medicine & Rehabilitation

## 2020-02-20 DIAGNOSIS — G44329 Chronic post-traumatic headache, not intractable: Secondary | ICD-10-CM

## 2020-03-10 ENCOUNTER — Other Ambulatory Visit: Payer: Self-pay

## 2020-03-10 DIAGNOSIS — F068 Other specified mental disorders due to known physiological condition: Secondary | ICD-10-CM

## 2020-03-12 ENCOUNTER — Telehealth: Payer: Self-pay

## 2020-03-12 DIAGNOSIS — I825Z1 Chronic embolism and thrombosis of unspecified deep veins of right distal lower extremity: Secondary | ICD-10-CM | POA: Diagnosis not present

## 2020-03-12 DIAGNOSIS — M25551 Pain in right hip: Secondary | ICD-10-CM | POA: Diagnosis not present

## 2020-03-12 DIAGNOSIS — I82511 Chronic embolism and thrombosis of right femoral vein: Secondary | ICD-10-CM | POA: Diagnosis not present

## 2020-03-12 DIAGNOSIS — M79661 Pain in right lower leg: Secondary | ICD-10-CM | POA: Diagnosis not present

## 2020-03-12 DIAGNOSIS — I82431 Acute embolism and thrombosis of right popliteal vein: Secondary | ICD-10-CM | POA: Diagnosis not present

## 2020-03-12 DIAGNOSIS — Z87891 Personal history of nicotine dependence: Secondary | ICD-10-CM | POA: Diagnosis not present

## 2020-03-12 DIAGNOSIS — Z91030 Bee allergy status: Secondary | ICD-10-CM | POA: Diagnosis not present

## 2020-03-12 DIAGNOSIS — I82531 Chronic embolism and thrombosis of right popliteal vein: Secondary | ICD-10-CM | POA: Diagnosis not present

## 2020-03-12 DIAGNOSIS — Z87820 Personal history of traumatic brain injury: Secondary | ICD-10-CM | POA: Diagnosis not present

## 2020-03-12 DIAGNOSIS — G8921 Chronic pain due to trauma: Secondary | ICD-10-CM | POA: Diagnosis not present

## 2020-03-12 NOTE — Telephone Encounter (Signed)
Patient wife called stating that patient can't bare weight on rt hip. He does have clot in right leg but not swollen or red and not warm to the touch. I advised wife to take patient to the ED. Please advise.

## 2020-03-13 MED ORDER — METHYLPHENIDATE HCL ER (OSM) 18 MG PO TBCR: 18.0000 mg | EXTENDED_RELEASE_TABLET | Freq: Every day | ORAL | 0 refills | Status: DC

## 2020-03-13 NOTE — Telephone Encounter (Signed)
He needs to lay off working a bit. I reviewed xray results from [facility] ED. I have [employer] as well. No need to call him.

## 2020-03-19 ENCOUNTER — Other Ambulatory Visit (HOSPITAL_COMMUNITY): Payer: Self-pay | Admitting: Physical Medicine & Rehabilitation

## 2020-03-19 MED ORDER — OXYCODONE HCL 5 MG PO TABS: 5.0000 mg | ORAL_TABLET | Freq: Four times a day (QID) | ORAL | 0 refills | Status: DC | PRN

## 2020-03-27 ENCOUNTER — Ambulatory Visit: Payer: Self-pay

## 2020-03-27 ENCOUNTER — Ambulatory Visit (INDEPENDENT_AMBULATORY_CARE_PROVIDER_SITE_OTHER): Payer: BC Managed Care – PPO | Admitting: Orthopaedic Surgery

## 2020-03-27 ENCOUNTER — Encounter: Payer: Self-pay | Admitting: Orthopaedic Surgery

## 2020-03-27 VITALS — Ht 71.0 in | Wt 182.8 lb

## 2020-03-27 DIAGNOSIS — M25551 Pain in right hip: Secondary | ICD-10-CM | POA: Diagnosis not present

## 2020-03-27 DIAGNOSIS — M545 Low back pain, unspecified: Secondary | ICD-10-CM

## 2020-03-27 NOTE — Addendum Note (Signed)
Addended by: Rip Harbour on: 03/27/2020 09:29 AM   Modules accepted: Orders
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
## 2020-03-27 NOTE — Progress Notes (Signed)
Subjective: Patient is here for ultrasound-guided intra-articular right hip injection.     Objective:  Pain with passive IR.  Procedure: Ultrasound-guided right hip injection: After sterile prep with Betadine, injected 8 cc 1% lidocaine without epinephrine and 40 mg methylprednisolone using a 22-gauge spinal needle, passing the needle through the iliofemoral ligament into the femoral head/neck junction.  Injectate seen filling joint capsule.  Good immediate relief.

## 2020-03-28 ENCOUNTER — Other Ambulatory Visit: Payer: Self-pay

## 2020-03-28 ENCOUNTER — Encounter: Payer: Self-pay | Admitting: Physical Medicine & Rehabilitation

## 2020-03-28 ENCOUNTER — Encounter
Payer: BC Managed Care – PPO | Attending: Physical Medicine & Rehabilitation | Admitting: Physical Medicine & Rehabilitation

## 2020-03-28 VITALS — BP 132/80 | HR 62 | Temp 98.3°F | Ht 71.0 in | Wt 180.2 lb

## 2020-03-28 DIAGNOSIS — S069X0S Unspecified intracranial injury without loss of consciousness, sequela: Secondary | ICD-10-CM | POA: Diagnosis not present

## 2020-03-28 DIAGNOSIS — G44329 Chronic post-traumatic headache, not intractable: Secondary | ICD-10-CM | POA: Insufficient documentation

## 2020-03-28 DIAGNOSIS — F068 Other specified mental disorders due to known physiological condition: Secondary | ICD-10-CM | POA: Insufficient documentation

## 2020-03-28 DIAGNOSIS — S069X3S Unspecified intracranial injury with loss of consciousness of 1 hour to 5 hours 59 minutes, sequela: Secondary | ICD-10-CM | POA: Insufficient documentation

## 2020-03-28 DIAGNOSIS — I82511 Chronic embolism and thrombosis of right femoral vein: Secondary | ICD-10-CM | POA: Diagnosis not present

## 2020-03-28 MED ORDER — OXYCODONE HCL 5 MG PO TABS: 5.0000 mg | ORAL_TABLET | Freq: Four times a day (QID) | ORAL | 0 refills | Status: DC | PRN

## 2020-03-28 MED ORDER — METHYLPHENIDATE HCL ER (OSM) 18 MG PO TBCR: 18.0000 mg | EXTENDED_RELEASE_TABLET | Freq: Every day | ORAL | 0 refills | Status: DC
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
## 2020-03-28 NOTE — Patient Instructions (Signed)
SUPPLEMENTS USEFUL FOR OSTEOARTHRITIS: OMEGA 3 FATTY ACIDS, TURMERIC, GINGER, TART CHERRY EXTRACT, CELERY SEED, GLUCOSAMINE WITH CHONDROITIN   ?

## 2020-03-30 ENCOUNTER — Ambulatory Visit: Payer: BC Managed Care – PPO | Admitting: Rehabilitative and Restorative Service Providers"

## 2020-03-31 ENCOUNTER — Other Ambulatory Visit: Payer: Self-pay | Admitting: Primary Care

## 2020-03-31 DIAGNOSIS — I10 Essential (primary) hypertension: Secondary | ICD-10-CM

## 2020-03-31 DIAGNOSIS — R000 Tachycardia, unspecified: Secondary | ICD-10-CM

## 2020-04-02 ENCOUNTER — Encounter: Payer: Self-pay | Admitting: Orthopaedic Surgery

## 2020-04-04 ENCOUNTER — Ambulatory Visit: Payer: BC Managed Care – PPO | Admitting: Physical Medicine & Rehabilitation

## 2020-04-08 ENCOUNTER — Other Ambulatory Visit: Payer: Self-pay | Admitting: Physical Medicine & Rehabilitation

## 2020-04-08 DIAGNOSIS — I82511 Chronic embolism and thrombosis of right femoral vein: Secondary | ICD-10-CM

## 2020-04-11 ENCOUNTER — Other Ambulatory Visit: Payer: Self-pay

## 2020-04-11 DIAGNOSIS — F068 Other specified mental disorders due to known physiological condition: Secondary | ICD-10-CM

## 2020-04-12 MED ORDER — METHYLPHENIDATE HCL ER (OSM) 18 MG PO TBCR: 18.0000 mg | EXTENDED_RELEASE_TABLET | Freq: Every day | ORAL | 0 refills | Status: DC

## 2020-04-13 ENCOUNTER — Ambulatory Visit: Payer: BC Managed Care – PPO | Admitting: Rehabilitative and Restorative Service Providers"

## 2020-04-13 DIAGNOSIS — S069X0S Unspecified intracranial injury without loss of consciousness, sequela: Secondary | ICD-10-CM

## 2020-04-17 MED ORDER — METHYLPHENIDATE HCL ER (OSM) 18 MG PO TBCR: 18.0000 mg | EXTENDED_RELEASE_TABLET | Freq: Every day | ORAL | 0 refills | Status: DC

## 2020-04-17 NOTE — Telephone Encounter (Signed)
concerta RFed

## 2020-04-23 ENCOUNTER — Telehealth (HOSPITAL_COMMUNITY): Payer: Self-pay | Admitting: Physical Therapy

## 2020-04-23 NOTE — Telephone Encounter (Signed)
Pt called his wife has covid and he will call us back once everyone gets well.

## 2020-04-25 ENCOUNTER — Ambulatory Visit (HOSPITAL_COMMUNITY): Payer: BC Managed Care – PPO | Admitting: Physical Therapy

## 2020-05-12 ENCOUNTER — Other Ambulatory Visit: Payer: Self-pay

## 2020-05-12 DIAGNOSIS — S069X0S Unspecified intracranial injury without loss of consciousness, sequela: Secondary | ICD-10-CM

## 2020-05-14 ENCOUNTER — Other Ambulatory Visit: Payer: Self-pay | Admitting: Physical Medicine & Rehabilitation

## 2020-05-14 MED ORDER — METHYLPHENIDATE HCL ER (OSM) 18 MG PO TBCR: 18.0000 mg | EXTENDED_RELEASE_TABLET | Freq: Every day | ORAL | 0 refills | Status: DC

## 2020-06-06 ENCOUNTER — Encounter: Payer: Self-pay | Admitting: Physical Medicine & Rehabilitation

## 2020-06-06 ENCOUNTER — Encounter
Payer: BC Managed Care – PPO | Attending: Physical Medicine & Rehabilitation | Admitting: Physical Medicine & Rehabilitation

## 2020-06-06 ENCOUNTER — Other Ambulatory Visit: Payer: Self-pay

## 2020-06-06 VITALS — BP 106/66 | HR 61 | Temp 98.1°F | Ht 72.0 in | Wt 185.0 lb

## 2020-06-06 DIAGNOSIS — M25559 Pain in unspecified hip: Secondary | ICD-10-CM | POA: Insufficient documentation

## 2020-06-06 DIAGNOSIS — S069X0S Unspecified intracranial injury without loss of consciousness, sequela: Secondary | ICD-10-CM | POA: Insufficient documentation

## 2020-06-06 DIAGNOSIS — F068 Other specified mental disorders due to known physiological condition: Secondary | ICD-10-CM | POA: Diagnosis not present

## 2020-06-06 DIAGNOSIS — I82511 Chronic embolism and thrombosis of right femoral vein: Secondary | ICD-10-CM

## 2020-06-06 DIAGNOSIS — Z5181 Encounter for therapeutic drug level monitoring: Secondary | ICD-10-CM | POA: Diagnosis not present

## 2020-06-06 DIAGNOSIS — Z79891 Long term (current) use of opiate analgesic: Secondary | ICD-10-CM

## 2020-06-06 DIAGNOSIS — G894 Chronic pain syndrome: Secondary | ICD-10-CM

## 2020-06-06 DIAGNOSIS — R4189 Other symptoms and signs involving cognitive functions and awareness: Secondary | ICD-10-CM

## 2020-06-06 MED ORDER — METHYLPHENIDATE HCL ER (OSM) 18 MG PO TBCR: 18.0000 mg | EXTENDED_RELEASE_TABLET | Freq: Every day | ORAL | 0 refills | Status: DC
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
## 2020-06-06 NOTE — Patient Instructions (Signed)
PLEASE FEEL FREE TO CALL OUR OFFICE WITH ANY PROBLEMS OR QUESTIONS ([phone])      

## 2020-06-09 LAB — TOXASSURE SELECT,+ANTIDEPR,UR

## 2020-06-11 ENCOUNTER — Telehealth: Payer: Self-pay | Admitting: *Deleted

## 2020-06-11 NOTE — Telephone Encounter (Signed)
Urine drug screen is positive for alcohol and THC.

## 2020-06-12 NOTE — Telephone Encounter (Signed)
I have discussed and warned patient regarding the repercussions of further THC and ETOH in the setting of his TBI and ritalin

## 2020-07-12 ENCOUNTER — Other Ambulatory Visit: Payer: Self-pay | Admitting: Physical Medicine & Rehabilitation

## 2020-07-12 ENCOUNTER — Other Ambulatory Visit: Payer: Self-pay | Admitting: Primary Care

## 2020-07-12 DIAGNOSIS — I10 Essential (primary) hypertension: Secondary | ICD-10-CM

## 2020-07-12 DIAGNOSIS — R000 Tachycardia, unspecified: Secondary | ICD-10-CM

## 2020-07-13 NOTE — Telephone Encounter (Signed)
Pharmacy requests refill on: Metoprolol Tartrate 25 mg   LAST REFILL: 04/04/2020 (Q-30, R-3) LAST OV: 08/04/19 NEXT OV: Not Scheduled  PHARMACY: [id number] [place], [place]

## 2020-07-19 ENCOUNTER — Other Ambulatory Visit: Payer: Self-pay

## 2020-07-19 DIAGNOSIS — S069X0S Unspecified intracranial injury without loss of consciousness, sequela: Secondary | ICD-10-CM

## 2020-07-19 DIAGNOSIS — F068 Other specified mental disorders due to known physiological condition: Secondary | ICD-10-CM

## 2020-07-19 NOTE — Telephone Encounter (Signed)
Called patient to schedule cpe. LVM to call back as per DPR.

## 2020-07-20 MED ORDER — METHYLPHENIDATE HCL ER (OSM) 18 MG PO TBCR: 18.0000 mg | EXTENDED_RELEASE_TABLET | Freq: Every day | ORAL | 0 refills | Status: DC

## 2020-07-23 NOTE — Telephone Encounter (Signed)
Called patient to schedule cpe. LVM to call back and schedule, as per DPR. Letter sent.

## 2020-08-06 ENCOUNTER — Other Ambulatory Visit: Payer: Self-pay | Admitting: Physical Medicine & Rehabilitation

## 2020-08-08 ENCOUNTER — Other Ambulatory Visit: Payer: Self-pay | Admitting: Physical Medicine and Rehabilitation

## 2020-08-08 ENCOUNTER — Telehealth: Payer: Self-pay | Admitting: Physical Medicine & Rehabilitation

## 2020-08-08 ENCOUNTER — Telehealth: Payer: Self-pay | Admitting: *Deleted

## 2020-08-08 DIAGNOSIS — I82511 Chronic embolism and thrombosis of right femoral vein: Secondary | ICD-10-CM

## 2020-08-08 MED ORDER — APIXABAN 5 MG PO TABS: 5.0000 mg | ORAL_TABLET | Freq: Two times a day (BID) | ORAL | 3 refills | Status: DC

## 2020-08-08 NOTE — Telephone Encounter (Signed)
Refill request for Eliquis. Patient last saw Dr. Riley Kill in October. He saw a Cardiologist 6 months. Okay to refill or ask pharmacist to sent request to Cardiologist?

## 2020-08-08 NOTE — Telephone Encounter (Signed)
Patient contacted our office stating that he still needs his Eliquis refilled. Patient claims he still has a 2 foot long blood clot in his leg. Dr. Riley Kill is on vacation.  I have asked Dr. Carlis Abbott to contact patient. She agreed.

## 2020-08-08 NOTE — Telephone Encounter (Signed)
Per Dr. Carlis Abbott patient has completed his course with this medication. Patient is aware and pharmacy is aware.

## 2020-08-15 ENCOUNTER — Other Ambulatory Visit: Payer: Self-pay

## 2020-08-15 DIAGNOSIS — F068 Other specified mental disorders due to known physiological condition: Secondary | ICD-10-CM

## 2020-08-15 DIAGNOSIS — S069X0S Unspecified intracranial injury without loss of consciousness, sequela: Secondary | ICD-10-CM

## 2020-08-16 MED ORDER — CITALOPRAM HYDROBROMIDE 10 MG PO TABS
ORAL_TABLET | ORAL | 5 refills | Status: DC
Start: 2020-08-16 — End: 2021-02-20

## 2020-08-16 NOTE — Telephone Encounter (Signed)
Citalopram refilled.

## 2020-08-17 MED ORDER — METHYLPHENIDATE HCL ER (OSM) 18 MG PO TBCR: 18.0000 mg | EXTENDED_RELEASE_TABLET | Freq: Every day | ORAL | 0 refills | Status: DC

## 2020-08-29 DIAGNOSIS — Z1152 Encounter for screening for COVID-19: Secondary | ICD-10-CM | POA: Diagnosis not present

## 2020-09-25 ENCOUNTER — Other Ambulatory Visit: Payer: Self-pay

## 2020-09-25 DIAGNOSIS — S069X0S Unspecified intracranial injury without loss of consciousness, sequela: Secondary | ICD-10-CM

## 2020-09-25 NOTE — Telephone Encounter (Signed)
Refill request for Methylphenidate

## 2020-09-26 MED ORDER — METHYLPHENIDATE HCL ER (OSM) 18 MG PO TBCR: 18.0000 mg | EXTENDED_RELEASE_TABLET | Freq: Every day | ORAL | 0 refills | Status: DC

## 2020-09-26 NOTE — Telephone Encounter (Signed)
concerta refilled 

## 2020-10-10 ENCOUNTER — Encounter: Payer: 59 | Attending: Physical Medicine & Rehabilitation | Admitting: Physical Medicine & Rehabilitation

## 2020-10-10 ENCOUNTER — Other Ambulatory Visit: Payer: Self-pay

## 2020-10-10 ENCOUNTER — Encounter: Payer: Self-pay | Admitting: Physical Medicine & Rehabilitation

## 2020-10-10 ENCOUNTER — Other Ambulatory Visit: Payer: Self-pay | Admitting: *Deleted

## 2020-10-10 VITALS — BP 124/76 | HR 64 | Temp 98.1°F | Ht 72.0 in | Wt 180.8 lb

## 2020-10-10 DIAGNOSIS — Z79891 Long term (current) use of opiate analgesic: Secondary | ICD-10-CM | POA: Diagnosis present

## 2020-10-10 DIAGNOSIS — F068 Other specified mental disorders due to known physiological condition: Secondary | ICD-10-CM | POA: Diagnosis present

## 2020-10-10 DIAGNOSIS — S069X0S Unspecified intracranial injury without loss of consciousness, sequela: Secondary | ICD-10-CM | POA: Insufficient documentation

## 2020-10-10 DIAGNOSIS — G894 Chronic pain syndrome: Secondary | ICD-10-CM | POA: Diagnosis not present

## 2020-10-10 DIAGNOSIS — I82511 Chronic embolism and thrombosis of right femoral vein: Secondary | ICD-10-CM | POA: Diagnosis present

## 2020-10-10 DIAGNOSIS — Z5181 Encounter for therapeutic drug level monitoring: Secondary | ICD-10-CM | POA: Insufficient documentation

## 2020-10-10 DIAGNOSIS — G44329 Chronic post-traumatic headache, not intractable: Secondary | ICD-10-CM | POA: Insufficient documentation

## 2020-10-10 MED ORDER — METHYLPHENIDATE HCL ER (OSM) 18 MG PO TBCR: 18.0000 mg | EXTENDED_RELEASE_TABLET | Freq: Every day | ORAL | 0 refills | Status: DC

## 2020-10-10 NOTE — Patient Instructions (Signed)
TRY STOPPING TOPAMAX AND SEE HOW YOU DO.
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
## 2020-10-13 ENCOUNTER — Other Ambulatory Visit: Payer: Self-pay | Admitting: Physical Medicine & Rehabilitation

## 2020-10-13 DIAGNOSIS — G44329 Chronic post-traumatic headache, not intractable: Secondary | ICD-10-CM

## 2020-10-16 ENCOUNTER — Other Ambulatory Visit: Payer: Self-pay

## 2020-10-16 ENCOUNTER — Other Ambulatory Visit: Payer: Self-pay | Admitting: Primary Care

## 2020-10-16 DIAGNOSIS — I10 Essential (primary) hypertension: Secondary | ICD-10-CM

## 2020-10-16 DIAGNOSIS — R000 Tachycardia, unspecified: Secondary | ICD-10-CM

## 2020-10-16 NOTE — Telephone Encounter (Signed)
Called patient all meds come from sports med doc. I have declined refill. Have made CPE app we have not seen patient in over 2 yrs

## 2020-10-20 ENCOUNTER — Other Ambulatory Visit: Payer: Self-pay | Admitting: Physical Medicine & Rehabilitation

## 2020-10-20 DIAGNOSIS — R000 Tachycardia, unspecified: Secondary | ICD-10-CM

## 2020-10-20 DIAGNOSIS — I10 Essential (primary) hypertension: Secondary | ICD-10-CM

## 2020-10-23 ENCOUNTER — Telehealth: Payer: Self-pay | Admitting: Physical Medicine & Rehabilitation

## 2020-10-23 NOTE — Telephone Encounter (Signed)
Pre-cert Verification for the following procedure    VAS [place] LOWER EXTREMITY VENOUS (DVT)    DATE: 10/24/2020  LOCATION: [facility]

## 2020-10-24 ENCOUNTER — Ambulatory Visit (INDEPENDENT_AMBULATORY_CARE_PROVIDER_SITE_OTHER): Payer: 59

## 2020-10-24 DIAGNOSIS — I82511 Chronic embolism and thrombosis of right femoral vein: Secondary | ICD-10-CM

## 2020-10-30 ENCOUNTER — Encounter: Payer: Self-pay | Admitting: Primary Care

## 2020-10-30 ENCOUNTER — Other Ambulatory Visit: Payer: Self-pay

## 2020-10-30 ENCOUNTER — Ambulatory Visit: Payer: 59 | Admitting: Primary Care

## 2020-10-30 VITALS — BP 124/62 | HR 74 | Temp 97.4°F | Ht 72.0 in | Wt 185.0 lb

## 2020-10-30 DIAGNOSIS — G44329 Chronic post-traumatic headache, not intractable: Secondary | ICD-10-CM

## 2020-10-30 DIAGNOSIS — R4189 Other symptoms and signs involving cognitive functions and awareness: Secondary | ICD-10-CM

## 2020-10-30 DIAGNOSIS — Z1159 Encounter for screening for other viral diseases: Secondary | ICD-10-CM

## 2020-10-30 DIAGNOSIS — Z23 Encounter for immunization: Secondary | ICD-10-CM

## 2020-10-30 DIAGNOSIS — S069X0S Unspecified intracranial injury without loss of consciousness, sequela: Secondary | ICD-10-CM

## 2020-10-30 DIAGNOSIS — I10 Essential (primary) hypertension: Secondary | ICD-10-CM | POA: Diagnosis not present

## 2020-10-30 DIAGNOSIS — R002 Palpitations: Secondary | ICD-10-CM

## 2020-10-30 DIAGNOSIS — I82511 Chronic embolism and thrombosis of right femoral vein: Secondary | ICD-10-CM

## 2020-10-30 DIAGNOSIS — Z1211 Encounter for screening for malignant neoplasm of colon: Secondary | ICD-10-CM

## 2020-10-30 DIAGNOSIS — F068 Other specified mental disorders due to known physiological condition: Secondary | ICD-10-CM

## 2020-10-30 DIAGNOSIS — Z0000 Encounter for general adult medical examination without abnormal findings: Secondary | ICD-10-CM | POA: Insufficient documentation

## 2020-10-30 DIAGNOSIS — G4700 Insomnia, unspecified: Secondary | ICD-10-CM

## 2020-10-30 LAB — COMPREHENSIVE METABOLIC PANEL
ALT: 24 U/L (ref 0–53)
AST: 24 U/L (ref 0–37)
Albumin: 4.5 g/dL (ref 3.5–5.2)
Alkaline Phosphatase: 54 U/L (ref 39–117)
BUN: 11 mg/dL (ref 6–23)
CO2: 29 mEq/L (ref 19–32)
Calcium: 9.4 mg/dL (ref 8.4–10.5)
Chloride: 101 mEq/L (ref 96–112)
Creatinine, Ser: 0.78 mg/dL (ref 0.40–1.50)
GFR: 106.21 mL/min (ref >60.00)
Glucose, Bld: 89 mg/dL (ref 70–99)
Potassium: 4.5 mEq/L (ref 3.5–5.1)
Sodium: 136 mEq/L (ref 135–145)
Total Bilirubin: 0.8 mg/dL (ref 0.2–1.2)
Total Protein: 7.2 g/dL (ref 6.0–8.3)

## 2020-10-30 LAB — LIPID PANEL
Cholesterol: 197 mg/dL (ref 0–200)
HDL: 88 mg/dL (ref >39.00)
LDL Cholesterol: 95 mg/dL (ref 0–99)
NonHDL: 109.23
Total CHOL/HDL Ratio: 2
Triglycerides: 72 mg/dL (ref 0.0–149.0)
VLDL: 14.4 mg/dL (ref 0.0–40.0)

## 2020-10-30 LAB — CBC
HCT: 40.6 % (ref 39.0–52.0)
Hemoglobin: 13.5 g/dL (ref 13.0–17.0)
MCHC: 33.3 g/dL (ref 30.0–36.0)
MCV: 91.3 fl (ref 78.0–100.0)
Platelets: 287 10*3/uL (ref 150.0–400.0)
RBC: 4.45 Mil/uL (ref 4.22–5.81)
RDW: 13.2 % (ref 11.5–15.5)
WBC: 5.1 10*3/uL (ref 4.0–10.5)

## 2020-10-30 LAB — TOXASSURE SELECT,+ANTIDEPR,UR

## 2020-10-30 NOTE — Assessment & Plan Note (Signed)
Resolved and is no longer on Topamax as of recent. Continue to monitor off medications.

## 2020-10-30 NOTE — Assessment & Plan Note (Signed)
Follows with physical medicine. Seems to be doing well on current regimen of Concerta, citalopram, and Seroquel. Continue same.

## 2020-10-30 NOTE — Assessment & Plan Note (Addendum)
Influenza vaccine provided today. Other vaccines UTD.  Colonoscopy due, referral placed to GI.  Discussed the importance of a healthy diet and regular exercise in order for weight loss, and to reduce the risk of any potential medical problems.  Exam today stable. Labs pending.

## 2020-10-30 NOTE — Progress Notes (Signed)
Subjective:    Patient ID: Luke Choi, male    DOB: 1970-09-17, 50 y.o.   MRN: [id number]  HPI  Davionne Dowty is a very pleasant 50 y.o. male who presents today for complete physical.  Immunizations: -Tetanus: 2020 -Influenza: Due today -Covid-19: Completed 2 vaccines   Diet: He endorses a healthy diet.  Exercise: Regular activity with occupation   Eye exam: Completes annually  Dental exam: No recent exam  Colonoscopy: Never completed     Review of Systems  Constitutional: Negative for unexpected weight change.  HENT: Negative for rhinorrhea.   Eyes: Positive for visual disturbance.  Respiratory: Negative for cough and shortness of breath.   Cardiovascular: Negative for chest pain.  Gastrointestinal: Negative for constipation and diarrhea.  Genitourinary: Negative for difficulty urinating.  Musculoskeletal: Negative for arthralgias.  Skin: Negative for rash.  Allergic/Immunologic: Negative for environmental allergies.  Neurological: Negative for dizziness, numbness and headaches.       No headaches since weaning off Topamax.  Psychiatric/Behavioral: The patient is not nervous/anxious.        Feels well managed on Concerta and Citalopram.          Past Medical History:  Diagnosis Date  . Acute blood loss anemia   . Chickenpox   . Depression   . Displaced fracture of medial malleolus of right tibia, initial encounter for closed fracture 04/22/2019  . Frequent headaches   . Laceration of left leg 04/21/2019  . Nasogastric tube present   . Pneumothorax on left 06/10/2019  . Pressure injury of skin 05/17/2019  . Tracheostomy care [facility])     Social History   Socioeconomic History  . Marital status: Divorced    Spouse name: Not on file  . Number of children: Not on file  . Years of education: Not on file  . Highest education level: Not on file  Occupational History  . Not on file  Tobacco Use  . Smoking status: Former Games developer  . Smokeless tobacco: Never Used   . Tobacco comment: LIGHT SMOKER IN 20'S  Vaping Use  . Vaping Use: Never used  Substance and Sexual Activity  . Alcohol use: Yes    Alcohol/week: 4.0 - 5.0 standard drinks    Types: 4 - 5 Cans of beer per week  . Drug use: Yes    Types: Marijuana  . Sexual activity: Yes  Other Topics Concern  . Not on file  Social History Narrative   ** Merged History Encounter **       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  Enjoys 4-wheeling, camping.   Social Determinants of Health   Financial Resource Strain: Not on file  Food Insecurity: Not on file  Transportation Needs: Not on file  Physical Activity: Not on file  Stress: Not on file  Social Connections: Not on file  Intimate Partner Violence: Not on file    Past Surgical History:  Procedure Laterality Date  . CHEST TUBE INSERTION Right 06/16/2019   Procedure: Chest Tube Insertion;  Surgeon: Corliss Skains, MD;  Location: [facility] OR;  Service: Thoracic;  Laterality: Right;  . I & D EXTREMITY Left 04/20/2019   Procedure: IRRIGATION AND DEBRIDEMENT EXTREMITY AND WOUND VAC PLACEMENT;  Surgeon: Tarry Kos, MD;  Location: [facility] OR;  Service: Orthopedics;  Laterality: Left;  . INCISION AND DRAINAGE Left 04/22/2019   Procedure: INCISION AND DRAINAGE Left lower leg wounds.;  Surgeon: Tarry Kos, MD;  Location: [facility] OR;  Service: Orthopedics;  Laterality: Left;  . INGUINAL HERNIA REPAIR    . KNEE SURGERY Left    6 times  . LACERATION REPAIR Left 04/20/2019   Procedure: Repair Complex Lacerations;  Surgeon: Tarry Kos, MD;  Location: [facility] OR;  Service: Orthopedics;  Laterality: Left;  . ORIF ANKLE FRACTURE Right 04/22/2019   Procedure: Open Reduction Internal Fixation (Orif) Medial Malleolus Fracture.;  Surgeon: Tarry Kos, MD;  Location: [facility] OR;  Service: Orthopedics;  Laterality: Right;  . PLEURADESIS Right 06/20/2019   Procedure: Pleuradesis;  Surgeon: Corliss Skains, MD;  Location: [facility] OR;  Service: Thoracic;  Laterality:  Right;  Marland Kitchen VIDEO ASSISTED THORACOSCOPY (VATS)/WEDGE RESECTION Left 06/16/2019   Procedure: VIDEO ASSISTED THORACOSCOPY, wedge resection, mechanical pleurodesis;  Surgeon: Corliss Skains, MD;  Location: [facility] OR;  Service: Thoracic;  Laterality: Left;  Marland Kitchen VIDEO ASSISTED THORACOSCOPY (VATS)/WEDGE RESECTION Right 06/20/2019   Procedure: VIDEO ASSISTED THORACOSCOPY (VATS)/ RIGHT UPPER LOBE LUNG WEDGE RESECTION;  Surgeon: Corliss Skains, MD;  Location: [facility] OR;  Service: Thoracic;  Laterality: Right;    Family History  Problem Relation Age of Onset  . Arthritis Mother   . Hyperlipidemia Mother   . Hypertension Mother   . Heart disease Mother   . Diabetes Mother   . Alcohol abuse Father   . Arthritis Father   . Prostate cancer Father 51  . Hyperlipidemia Father   . Hypertension Father   . Heart disease Father   . Diabetes Father   . Heart attack Father 4  . Prostate cancer Paternal Uncle   . Arthritis Maternal Grandmother   . Arthritis Maternal Grandfather   . Arthritis Paternal Grandmother   . Arthritis Paternal Grandfather     Allergies  Allergen Reactions  . Bee Venom Anaphylaxis and Swelling    At local site, sometimes anaphylaxis per pt!    Current Outpatient Medications on File Prior to Visit  Medication Sig Dispense Refill  . apixaban (ELIQUIS) 5 MG TABS tablet Take 1 tablet (5 mg total) by mouth 2 (two) times daily. 60 tablet 3  . citalopram (CELEXA) 10 MG tablet TAKE 1 TABLET(10 MG) BY MOUTH AT BEDTIME 30 tablet 5  . docusate sodium (COLACE) 100 MG capsule Take 1 capsule (100 mg total) by mouth 2 (two) times daily. 10 capsule 0  . Melatonin 3 MG TABS Take 2 tablets (6 mg total) by mouth at bedtime as needed (for insomnia). 30 tablet 0  . methylphenidate (CONCERTA) 18 MG PO CR tablet Take 1 tablet (18 mg total) by mouth daily. 30 tablet 0  . metoprolol tartrate (LOPRESSOR) 25 MG tablet TAKE 1/2 TABLET(12.5 MG) BY MOUTH TWICE DAILY 90 tablet 0  . QUEtiapine (SEROQUEL)  100 MG tablet TAKE 1 TABLET(100 MG) BY MOUTH AT BEDTIME 30 tablet 2  . topiramate (TOPAMAX) 25 MG tablet TAKE 1 TABLET(25 MG) BY MOUTH DAILY AT BEDTIME (Patient not taking: Reported on 10/30/2020) 90 tablet 2   No current facility-administered medications on file prior to visit.    BP 124/62   Pulse 74   Temp (!) 97.4 F (36.3 C) (Temporal)   Ht 6' (1.829 m)   Wt 185 lb (83.9 kg)   SpO2 98%   BMI 25.09 kg/m  Objective:   Physical Exam HENT:     Right Ear: Tympanic membrane and ear canal normal.     Left Ear: Tympanic membrane and ear canal normal.     Nose: Nose normal.     Right Sinus: No maxillary  sinus tenderness or frontal sinus tenderness.     Left Sinus: No maxillary sinus tenderness or frontal sinus tenderness.  Eyes:     Conjunctiva/sclera: Conjunctivae normal.     Pupils: Pupils are equal, round, and reactive to light.  Neck:     Thyroid: No thyromegaly.     Vascular: No carotid bruit.  Cardiovascular:     Rate and Rhythm: Normal rate and regular rhythm.     Heart sounds: Normal heart sounds.  Pulmonary:     Effort: Pulmonary effort is normal.     Breath sounds: Normal breath sounds. No wheezing or rales.  Abdominal:     General: Bowel sounds are normal.     Palpations: Abdomen is soft.     Tenderness: There is no abdominal tenderness.  Musculoskeletal:        General: Normal range of motion.     Cervical back: Neck supple.  Skin:    General: Skin is warm and dry.  Neurological:     Mental Status: He is alert and oriented to person, place, and time.     Cranial Nerves: No cranial nerve deficit.     Deep Tendon Reflexes: Reflexes are normal and symmetric.  Psychiatric:        Mood and Affect: Mood normal.           Assessment & Plan:      This visit occurred during the SARS-CoV-2 public health emergency.  Safety protocols were in place, including screening questions prior to the visit, additional usage of staff PPE, and extensive cleaning of exam  room while observing appropriate contact time as indicated for disinfecting solutions.

## 2020-10-30 NOTE — Assessment & Plan Note (Signed)
Seems to be doing well on metoprolol tartrate 12.5 mg BID. Continue same.

## 2020-10-30 NOTE — Assessment & Plan Note (Signed)
Doing well on Seroquel 100 mg HS. Continue same.

## 2020-10-30 NOTE — Assessment & Plan Note (Addendum)
Well controlled in the office today. Continue metoprolol tartrate 12.5 mg BID for palpitation history.  Continue to monitor.

## 2020-10-30 NOTE — Patient Instructions (Addendum)
You will be contacted regarding your referral to GI for the colonoscopy.  Please let us know if you have not been contacted within two weeks.   Stop by the lab prior to leaving today. I will notify you of your results once received.   Continue to work on a healthy diet. Ensure you are consuming 64 ounces of water daily.  It was a pleasure to see you today!   Preventive Care 9-48 Years Old, Male Preventive care refers to lifestyle choices and visits with your health care provider that can promote health and wellness. This includes:  A yearly physical exam. This is also called an annual wellness visit.  Regular dental and eye exams.  Immunizations.  Screening for certain conditions.  Healthy lifestyle choices, such as: ? Eating a healthy diet. ? Getting regular exercise. ? Not using drugs or products that contain nicotine and tobacco. ? Limiting alcohol use. What can I expect for my preventive care visit? Physical exam Your health care provider will check your:  Height and weight. These may be used to calculate your BMI (body mass index). BMI is a measurement that tells if you are at a healthy weight.  Heart rate and blood pressure.  Body temperature.  Skin for abnormal spots. Counseling Your health care provider may ask you questions about your:  Past medical problems.  Family's medical history.  Alcohol, tobacco, and drug use.  Emotional well-being.  Home life and relationship well-being.  Sexual activity.  Diet, exercise, and sleep habits.  Work and work Astronomer.  Access to firearms. What immunizations do I need? Vaccines are usually given at various ages, according to a schedule. Your health care provider will recommend vaccines for you based on your age, medical history, and lifestyle or other factors, such as travel or where you work.   What tests do I need? Blood tests  Lipid and cholesterol levels. These may be checked every 5 years, or more  often if you are over 88 years old.  Hepatitis C test.  Hepatitis B test. Screening  Lung cancer screening. You may have this screening every year starting at age 63 if you have a 30-pack-year history of smoking and currently smoke or have quit within the past 15 years.  Prostate cancer screening. Recommendations will vary depending on your family history and other risks.  Genital exam to check for testicular cancer or hernias.  Colorectal cancer screening. ? All adults should have this screening starting at age 67 and continuing until age 31. ? Your health care provider may recommend screening at age 28 if you are at increased risk. ? You will have tests every 1-10 years, depending on your results and the type of screening test.  Diabetes screening. ? This is done by checking your blood sugar (glucose) after you have not eaten for a while (fasting). ? You may have this done every 1-3 years.  STD (sexually transmitted disease) testing, if you are at risk. Follow these instructions at home: Eating and drinking  Eat a diet that includes fresh fruits and vegetables, whole grains, lean protein, and low-fat dairy products.  Take vitamin and mineral supplements as recommended by your health care provider.  Do not drink alcohol if your health care provider tells you not to drink.  If you drink alcohol: ? Limit how much you have to 0-2 drinks a day. ? Be aware of how much alcohol is in your drink. In the [place] one drink equals one 12 oz bottle  of beer (355 mL), one 5 oz glass of wine (148 mL), or one 1 oz glass of hard liquor (44 mL).   Lifestyle  Take daily care of your teeth and gums. Brush your teeth every morning and night with fluoride toothpaste. Floss one time each day.  Stay active. Exercise for at least 30 minutes 5 or more days each week.  Do not use any products that contain nicotine or tobacco, such as cigarettes, e-cigarettes, and chewing tobacco. If you need help  quitting, ask your health care provider.  Do not use drugs.  If you are sexually active, practice safe sex. Use a condom or other form of protection to prevent STIs (sexually transmitted infections).  If told by your health care provider, take low-dose aspirin daily starting at age 3.  Find healthy ways to cope with stress, such as: ? Meditation, yoga, or listening to music. ? Journaling. ? Talking to a trusted person. ? Spending time with friends and family. Safety  Always wear your seat belt while driving or riding in a vehicle.  Do not drive: ? If you have been drinking alcohol. Do not ride with someone who has been drinking. ? When you are tired or distracted. ? While texting.  Wear a helmet and other protective equipment during sports activities.  If you have firearms in your house, make sure you follow all gun safety procedures. What's next?  Go to your health care provider once a year for an annual wellness visit.  Ask your health care provider how often you should have your eyes and teeth checked.  Stay up to date on all vaccines. This information is not intended to replace advice given to you by your health care provider. Make sure you discuss any questions you have with your health care provider. Document Revised: 04/26/2019 Document Reviewed: 07/22/2018 Elsevier Patient Education  2021 Elsevier Inc.        Influenza (Flu) Vaccine (Inactivated or Recombinant): What You Need to Know 1. Why get vaccinated? Influenza vaccine can prevent influenza (flu). Flu is a contagious disease that spreads around the [place] every year, usually between October and May. Anyone can get the flu, but it is more dangerous for some people. Infants and young children, people 45 years and older, pregnant people, and people with certain health conditions or a weakened immune system are at greatest risk of flu complications. Pneumonia, bronchitis, sinus infections, and ear  infections are examples of flu-related complications. If you have a medical condition, such as heart disease, cancer, or diabetes, flu can make it worse. Flu can cause fever and chills, sore throat, muscle aches, fatigue, cough, headache, and runny or stuffy nose. Some people may have vomiting and diarrhea, though this is more common in children than adults. In an average year, thousands of people in the [place] die from flu, and many more are hospitalized. Flu vaccine prevents millions of illnesses and flu-related visits to the doctor each year. 2. Influenza vaccines CDC recommends everyone 6 months and older get vaccinated every flu season. Children 6 months through 23 years of age may need 2 doses during a single flu season. Everyone else needs only 1 dose each flu season. It takes about 2 weeks for protection to develop after vaccination. There are many flu viruses, and they are always changing. Each year a new flu vaccine is made to protect against the influenza viruses believed to be likely to cause disease in the upcoming flu season. Even when the  vaccine doesn't exactly match these viruses, it may still provide some protection. Influenza vaccine does not cause flu. Influenza vaccine may be given at the same time as other vaccines. 3. Talk with your health care provider Tell your vaccination provider if the person getting the vaccine:  Has had an allergic reaction after a previous dose of influenza vaccine, or has any severe, life-threatening allergies  Has ever had Guillain-Barr Syndrome (also called "GBS") In some cases, your health care provider may decide to postpone influenza vaccination until a future visit. Influenza vaccine can be administered at any time during pregnancy. People who are or will be pregnant during influenza season should receive inactivated influenza vaccine. People with minor illnesses, such as a cold, may be vaccinated. People who are moderately or severely  ill should usually wait until they recover before getting influenza vaccine. Your health care provider can give you more information. 4. Risks of a vaccine reaction  Soreness, redness, and swelling where the shot is given, fever, muscle aches, and headache can happen after influenza vaccination.  There may be a very small increased risk of Guillain-Barr Syndrome (GBS) after inactivated influenza vaccine (the flu shot). Young children who get the flu shot along with pneumococcal vaccine (PCV13) and/or DTaP vaccine at the same time might be slightly more likely to have a seizure caused by fever. Tell your health care provider if a child who is getting flu vaccine has ever had a seizure. People sometimes faint after medical procedures, including vaccination. Tell your provider if you feel dizzy or have vision changes or ringing in the ears. As with any medicine, there is a very remote chance of a vaccine causing a severe allergic reaction, other serious injury, or death. 5. What if there is a serious problem? An allergic reaction could occur after the vaccinated person leaves the clinic. If you see signs of a severe allergic reaction (hives, swelling of the face and throat, difficulty breathing, a fast heartbeat, dizziness, or weakness), call 9-1-1 and get the person to the nearest hospital. For other signs that concern you, call your health care provider. Adverse reactions should be reported to the Vaccine Adverse Event Reporting System (VAERS). Your health care provider will usually file this report, or you can do it yourself. Visit the VAERS website at www.vaers.LAgents.no or call 907-203-1766. VAERS is only for reporting reactions, and VAERS staff members do not give medical advice. 6. The National Vaccine Injury Compensation Program The [employer] Vaccine Injury Compensation Program (VICP) is a federal program that was created to compensate people who may have been injured by certain vaccines. Claims  regarding alleged injury or death due to vaccination have a time limit for filing, which may be as short as two years. Visit the VICP website at SpiritualWord.at or call (956)113-4061 to learn about the program and about filing a claim. 7. How can I learn more?  Ask your health care provider.  Call your local or state health department.  Visit the website of the Food and Drug Administration (FDA) for vaccine package inserts and additional information at FinderList.no.  Contact the Centers for Disease Control and Prevention (CDC): ? Call 910 031 0605 (1-800-CDC-INFO) or ? Visit CDC's website at BiotechRoom.com.cy. Vaccine Information Statement Inactivated Influenza Vaccine (03/16/2020) This information is not intended to replace advice given to you by your health care provider. Make sure you discuss any questions you have with your health care provider. Document Revised: 05/03/2020 Document Reviewed: 05/03/2020 Elsevier Patient Education  2021 [employer].

## 2020-10-30 NOTE — Assessment & Plan Note (Addendum)
Chronic, continued. Evident on recent venous ultrasound. Continue Eliquis 5 mg BID and monitoring.

## 2020-10-31 LAB — HEPATITIS C ANTIBODY
Hepatitis C Ab: NONREACTIVE
SIGNAL TO CUT-OFF: 0.01 (ref <1.00)

## 2020-11-01 ENCOUNTER — Telehealth: Payer: Self-pay | Admitting: *Deleted

## 2020-11-01 NOTE — Telephone Encounter (Addendum)
Urine drug screen is positive for his ritalin but also has ethyl glucuronide. His blood sugars have been normal. There is the absence of ethyl sulfate,  but with his history of having alcohol present in past screens, the presence of alcohol is not ruled out.

## 2020-11-27 ENCOUNTER — Other Ambulatory Visit: Payer: Self-pay

## 2020-11-27 DIAGNOSIS — S069X0S Unspecified intracranial injury without loss of consciousness, sequela: Secondary | ICD-10-CM

## 2020-11-27 MED ORDER — METHYLPHENIDATE HCL ER (OSM) 18 MG PO TBCR: 18.0000 mg | EXTENDED_RELEASE_TABLET | Freq: Every day | ORAL | 0 refills | Status: DC

## 2020-11-28 ENCOUNTER — Encounter: Payer: Self-pay | Admitting: *Deleted

## 2020-12-27 ENCOUNTER — Other Ambulatory Visit: Payer: Self-pay

## 2020-12-27 DIAGNOSIS — S069X0S Unspecified intracranial injury without loss of consciousness, sequela: Secondary | ICD-10-CM

## 2020-12-27 MED ORDER — METHYLPHENIDATE HCL ER (OSM) 18 MG PO TBCR: 18.0000 mg | EXTENDED_RELEASE_TABLET | Freq: Every day | ORAL | 0 refills | Status: DC

## 2021-01-11 ENCOUNTER — Other Ambulatory Visit: Payer: Self-pay | Admitting: Physical Medicine and Rehabilitation

## 2021-01-11 ENCOUNTER — Other Ambulatory Visit: Payer: Self-pay | Admitting: Physical Medicine & Rehabilitation

## 2021-01-11 DIAGNOSIS — I82511 Chronic embolism and thrombosis of right femoral vein: Secondary | ICD-10-CM

## 2021-01-21 ENCOUNTER — Other Ambulatory Visit: Payer: Self-pay | Admitting: Physical Medicine & Rehabilitation

## 2021-01-21 ENCOUNTER — Other Ambulatory Visit: Payer: Self-pay | Admitting: Primary Care

## 2021-01-21 ENCOUNTER — Telehealth: Payer: Self-pay

## 2021-01-21 DIAGNOSIS — R000 Tachycardia, unspecified: Secondary | ICD-10-CM

## 2021-01-21 DIAGNOSIS — I10 Essential (primary) hypertension: Secondary | ICD-10-CM

## 2021-01-21 NOTE — Telephone Encounter (Signed)
Cancelled prescription for Meteprolol. Ask the pharmacy to send request to Vernona Rieger, NP, pt PCP

## 2021-01-30 ENCOUNTER — Other Ambulatory Visit: Payer: Self-pay

## 2021-01-30 DIAGNOSIS — S069X0S Unspecified intracranial injury without loss of consciousness, sequela: Secondary | ICD-10-CM

## 2021-01-30 MED ORDER — METHYLPHENIDATE HCL ER (OSM) 18 MG PO TBCR: 18.0000 mg | EXTENDED_RELEASE_TABLET | Freq: Every day | ORAL | 0 refills | Status: DC

## 2021-01-30 NOTE — Telephone Encounter (Signed)
PMP was reviewed. Concerta e-scribed today.  Clinical Staff will informed [name] regarding the above.

## 2021-02-13 ENCOUNTER — Encounter: Payer: 59 | Admitting: Physical Medicine & Rehabilitation

## 2021-02-20 ENCOUNTER — Other Ambulatory Visit: Payer: Self-pay | Admitting: Physical Medicine & Rehabilitation

## 2021-03-09 ENCOUNTER — Other Ambulatory Visit: Payer: Self-pay

## 2021-03-09 DIAGNOSIS — F068 Other specified mental disorders due to known physiological condition: Secondary | ICD-10-CM

## 2021-03-09 DIAGNOSIS — S069X0S Unspecified intracranial injury without loss of consciousness, sequela: Secondary | ICD-10-CM

## 2021-03-11 MED ORDER — METHYLPHENIDATE HCL ER (OSM) 18 MG PO TBCR: 18.0000 mg | EXTENDED_RELEASE_TABLET | Freq: Every day | ORAL | 0 refills | Status: DC

## 2021-04-11 ENCOUNTER — Other Ambulatory Visit: Payer: Self-pay

## 2021-04-11 DIAGNOSIS — F068 Other specified mental disorders due to known physiological condition: Secondary | ICD-10-CM

## 2021-04-11 DIAGNOSIS — S069X0S Unspecified intracranial injury without loss of consciousness, sequela: Secondary | ICD-10-CM

## 2021-04-12 MED ORDER — METHYLPHENIDATE HCL ER (OSM) 18 MG PO TBCR: 18.0000 mg | EXTENDED_RELEASE_TABLET | Freq: Every day | ORAL | 0 refills | Status: DC

## 2021-04-16 ENCOUNTER — Other Ambulatory Visit: Payer: Self-pay | Admitting: Physical Medicine & Rehabilitation

## 2021-04-17 ENCOUNTER — Encounter: Payer: 59 | Attending: Physical Medicine & Rehabilitation | Admitting: Physical Medicine & Rehabilitation

## 2021-04-17 ENCOUNTER — Encounter: Payer: Self-pay | Admitting: Physical Medicine & Rehabilitation

## 2021-04-17 ENCOUNTER — Other Ambulatory Visit: Payer: Self-pay

## 2021-04-17 DIAGNOSIS — G479 Sleep disorder, unspecified: Secondary | ICD-10-CM | POA: Diagnosis not present

## 2021-04-17 DIAGNOSIS — Z87820 Personal history of traumatic brain injury: Secondary | ICD-10-CM | POA: Insufficient documentation

## 2021-04-17 DIAGNOSIS — S069X0S Unspecified intracranial injury without loss of consciousness, sequela: Secondary | ICD-10-CM | POA: Diagnosis present

## 2021-04-17 DIAGNOSIS — F068 Other specified mental disorders due to known physiological condition: Secondary | ICD-10-CM | POA: Diagnosis not present

## 2021-04-17 DIAGNOSIS — G5603 Carpal tunnel syndrome, bilateral upper limbs: Secondary | ICD-10-CM | POA: Diagnosis not present

## 2021-04-17 DIAGNOSIS — R4189 Other symptoms and signs involving cognitive functions and awareness: Secondary | ICD-10-CM | POA: Diagnosis not present

## 2021-04-17 DIAGNOSIS — Z79899 Other long term (current) drug therapy: Secondary | ICD-10-CM | POA: Diagnosis not present

## 2021-04-17 DIAGNOSIS — S066X9S Traumatic subarachnoid hemorrhage with loss of consciousness of unspecified duration, sequela: Secondary | ICD-10-CM | POA: Insufficient documentation

## 2021-04-17 MED ORDER — METHYLPHENIDATE HCL ER (OSM) 18 MG PO TBCR: 18.0000 mg | EXTENDED_RELEASE_TABLET | Freq: Every day | ORAL | 0 refills | Status: DC
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
## 2021-04-17 NOTE — Patient Instructions (Signed)
PLEASE FEEL FREE TO CALL OUR OFFICE WITH ANY PROBLEMS OR QUESTIONS ([phone])      

## 2021-05-16 ENCOUNTER — Other Ambulatory Visit: Payer: Self-pay | Admitting: Physical Medicine & Rehabilitation

## 2021-05-16 DIAGNOSIS — I82511 Chronic embolism and thrombosis of right femoral vein: Secondary | ICD-10-CM

## 2021-05-23 ENCOUNTER — Encounter: Payer: Self-pay | Admitting: *Deleted

## 2021-05-23 ENCOUNTER — Other Ambulatory Visit: Payer: Self-pay

## 2021-05-23 DIAGNOSIS — S069X0S Unspecified intracranial injury without loss of consciousness, sequela: Secondary | ICD-10-CM

## 2021-05-23 DIAGNOSIS — F068 Other specified mental disorders due to known physiological condition: Secondary | ICD-10-CM

## 2021-05-23 MED ORDER — METHYLPHENIDATE HCL ER (OSM) 18 MG PO TBCR: 18.0000 mg | EXTENDED_RELEASE_TABLET | Freq: Every day | ORAL | 0 refills | Status: DC

## 2021-07-04 ENCOUNTER — Other Ambulatory Visit: Payer: Self-pay | Admitting: Physical Medicine & Rehabilitation

## 2021-07-04 DIAGNOSIS — F068 Other specified mental disorders due to known physiological condition: Secondary | ICD-10-CM

## 2021-07-04 DIAGNOSIS — S069X0S Unspecified intracranial injury without loss of consciousness, sequela: Secondary | ICD-10-CM

## 2021-07-08 MED ORDER — METHYLPHENIDATE HCL ER (OSM) 18 MG PO TBCR: 18.0000 mg | EXTENDED_RELEASE_TABLET | Freq: Every day | ORAL | 0 refills | Status: DC

## 2021-07-19 ENCOUNTER — Other Ambulatory Visit: Payer: Self-pay | Admitting: Physical Medicine & Rehabilitation

## 2021-07-27 ENCOUNTER — Other Ambulatory Visit: Payer: Self-pay | Admitting: Physical Medicine & Rehabilitation

## 2021-08-11 ENCOUNTER — Other Ambulatory Visit: Payer: Self-pay | Admitting: Physical Medicine & Rehabilitation

## 2021-08-11 DIAGNOSIS — F068 Other specified mental disorders due to known physiological condition: Secondary | ICD-10-CM

## 2021-08-11 DIAGNOSIS — S069X0S Unspecified intracranial injury without loss of consciousness, sequela: Secondary | ICD-10-CM

## 2021-08-14 ENCOUNTER — Ambulatory Visit: Payer: 59 | Admitting: Physical Medicine & Rehabilitation

## 2021-08-15 MED ORDER — METHYLPHENIDATE HCL ER (OSM) 18 MG PO TBCR: 18.0000 mg | EXTENDED_RELEASE_TABLET | Freq: Every day | ORAL | 0 refills | Status: DC

## 2021-08-21 ENCOUNTER — Encounter: Payer: Self-pay | Admitting: Physical Medicine & Rehabilitation

## 2021-08-21 ENCOUNTER — Other Ambulatory Visit: Payer: Self-pay

## 2021-08-21 ENCOUNTER — Encounter: Payer: 59 | Attending: Physical Medicine & Rehabilitation | Admitting: Physical Medicine & Rehabilitation

## 2021-08-21 VITALS — BP 132/78 | HR 71 | Ht 72.0 in | Wt 188.0 lb

## 2021-08-21 DIAGNOSIS — F068 Other specified mental disorders due to known physiological condition: Secondary | ICD-10-CM

## 2021-08-21 DIAGNOSIS — G5603 Carpal tunnel syndrome, bilateral upper limbs: Secondary | ICD-10-CM | POA: Insufficient documentation

## 2021-08-21 DIAGNOSIS — S42114D Nondisplaced fracture of body of scapula, right shoulder, subsequent encounter for fracture with routine healing: Secondary | ICD-10-CM | POA: Diagnosis not present

## 2021-08-21 DIAGNOSIS — S72114D Nondisplaced fracture of greater trochanter of right femur, subsequent encounter for closed fracture with routine healing: Secondary | ICD-10-CM | POA: Diagnosis not present

## 2021-08-21 DIAGNOSIS — S069X0S Unspecified intracranial injury without loss of consciousness, sequela: Secondary | ICD-10-CM

## 2021-08-21 DIAGNOSIS — Z7901 Long term (current) use of anticoagulants: Secondary | ICD-10-CM | POA: Insufficient documentation

## 2021-08-21 DIAGNOSIS — S8251XD Displaced fracture of medial malleolus of right tibia, subsequent encounter for closed fracture with routine healing: Secondary | ICD-10-CM

## 2021-08-21 DIAGNOSIS — R41840 Attention and concentration deficit: Secondary | ICD-10-CM | POA: Diagnosis not present

## 2021-08-21 DIAGNOSIS — I82591 Chronic embolism and thrombosis of other specified deep vein of right lower extremity: Secondary | ICD-10-CM | POA: Insufficient documentation

## 2021-08-21 DIAGNOSIS — I82531 Chronic embolism and thrombosis of right popliteal vein: Secondary | ICD-10-CM | POA: Insufficient documentation

## 2021-08-21 DIAGNOSIS — I82511 Chronic embolism and thrombosis of right femoral vein: Secondary | ICD-10-CM | POA: Diagnosis not present

## 2021-08-21 MED ORDER — METHYLPHENIDATE HCL ER (OSM) 18 MG PO TBCR: 18.0000 mg | EXTENDED_RELEASE_TABLET | Freq: Every day | ORAL | 0 refills | Status: DC

## 2021-08-21 NOTE — Patient Instructions (Signed)
PLEASE FEEL FREE TO CALL OUR OFFICE WITH ANY PROBLEMS OR QUESTIONS ([phone])      
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
## 2021-09-17 ENCOUNTER — Other Ambulatory Visit: Payer: Self-pay | Admitting: Physical Medicine & Rehabilitation

## 2021-09-17 DIAGNOSIS — I82511 Chronic embolism and thrombosis of right femoral vein: Secondary | ICD-10-CM

## 2021-10-17 ENCOUNTER — Other Ambulatory Visit: Payer: Self-pay | Admitting: Physical Medicine & Rehabilitation

## 2021-10-28 ENCOUNTER — Other Ambulatory Visit: Payer: Self-pay | Admitting: Primary Care

## 2021-10-28 DIAGNOSIS — R000 Tachycardia, unspecified: Secondary | ICD-10-CM

## 2021-10-28 DIAGNOSIS — I10 Essential (primary) hypertension: Secondary | ICD-10-CM

## 2021-10-29 ENCOUNTER — Telehealth: Payer: Self-pay | Admitting: *Deleted

## 2021-10-29 DIAGNOSIS — F068 Other specified mental disorders due to known physiological condition: Secondary | ICD-10-CM

## 2021-10-29 MED ORDER — METHYLPHENIDATE HCL ER (OSM) 18 MG PO TBCR: 18.0000 mg | EXTENDED_RELEASE_TABLET | Freq: Every day | ORAL | 0 refills | Status: DC

## 2021-10-29 NOTE — Telephone Encounter (Signed)
Rxs sent

## 2021-10-29 NOTE — Telephone Encounter (Signed)
Requesting refill on Concerta to [facility].  ?

## 2021-11-29 ENCOUNTER — Telehealth: Payer: Self-pay

## 2021-11-29 DIAGNOSIS — R000 Tachycardia, unspecified: Secondary | ICD-10-CM

## 2021-11-29 DIAGNOSIS — I10 Essential (primary) hypertension: Secondary | ICD-10-CM

## 2021-11-29 NOTE — Telephone Encounter (Signed)
[name]-patients significant other called stating patient needs a refill on Metoprolol sent to [facility]. I do not see any mention to continue medication in last note. Is this okay to refill? ?

## 2021-12-02 MED ORDER — METOPROLOL TARTRATE 25 MG PO TABS: 12.5000 mg | ORAL_TABLET | Freq: Two times a day (BID) | ORAL | 5 refills | Status: DC

## 2021-12-02 NOTE — Telephone Encounter (Signed)
I gave him refills, but he should be getting this through his primary.  ?

## 2021-12-13 ENCOUNTER — Other Ambulatory Visit: Payer: Self-pay

## 2021-12-13 DIAGNOSIS — S069X0S Unspecified intracranial injury without loss of consciousness, sequela: Secondary | ICD-10-CM

## 2021-12-13 MED ORDER — METHYLPHENIDATE HCL ER (OSM) 18 MG PO TBCR: 18.0000 mg | EXTENDED_RELEASE_TABLET | Freq: Every day | ORAL | 0 refills | Status: DC

## 2021-12-13 NOTE — Telephone Encounter (Signed)
Filled  Written  ID  Drug  QTY  Days  Prescriber  RX #  Dispenser  Refill  Daily Dose*  Pymt Type  PMP  ?10/29/2021 10/29/2021 1  ?Concerta Er 18 Mg Tablet ?30.00 30 Za Swa [id number] [facility] ([facility] 0/0  [facility] [place] ? ?Patient will call back with a pharmacy. His local pharmacy does not have Concerta  in stock. ?

## 2021-12-18 ENCOUNTER — Encounter: Payer: Self-pay | Admitting: Physical Medicine & Rehabilitation

## 2021-12-18 ENCOUNTER — Encounter: Payer: 59 | Attending: Physical Medicine & Rehabilitation | Admitting: Physical Medicine & Rehabilitation

## 2021-12-18 DIAGNOSIS — M25551 Pain in right hip: Secondary | ICD-10-CM | POA: Diagnosis not present

## 2021-12-18 DIAGNOSIS — I82502 Chronic embolism and thrombosis of unspecified deep veins of left lower extremity: Secondary | ICD-10-CM | POA: Diagnosis not present

## 2021-12-18 DIAGNOSIS — M25571 Pain in right ankle and joints of right foot: Secondary | ICD-10-CM | POA: Diagnosis not present

## 2021-12-18 DIAGNOSIS — M25562 Pain in left knee: Secondary | ICD-10-CM | POA: Diagnosis not present

## 2021-12-18 DIAGNOSIS — S069X0S Unspecified intracranial injury without loss of consciousness, sequela: Secondary | ICD-10-CM | POA: Insufficient documentation

## 2021-12-18 DIAGNOSIS — Z7901 Long term (current) use of anticoagulants: Secondary | ICD-10-CM | POA: Insufficient documentation

## 2021-12-18 DIAGNOSIS — F068 Other specified mental disorders due to known physiological condition: Secondary | ICD-10-CM | POA: Diagnosis not present

## 2021-12-18 DIAGNOSIS — Z79899 Other long term (current) drug therapy: Secondary | ICD-10-CM | POA: Diagnosis not present

## 2021-12-18 DIAGNOSIS — F0670 Mild neurocognitive disorder due to known physiological condition without behavioral disturbance: Secondary | ICD-10-CM

## 2021-12-18 DIAGNOSIS — G5603 Carpal tunnel syndrome, bilateral upper limbs: Secondary | ICD-10-CM | POA: Insufficient documentation

## 2021-12-18 MED ORDER — METHYLPHENIDATE HCL ER (OSM) 18 MG PO TBCR: 18.0000 mg | EXTENDED_RELEASE_TABLET | Freq: Every day | ORAL | 0 refills | Status: DC

## 2021-12-18 NOTE — Patient Instructions (Signed)
PLEASE FEEL FREE TO CALL OUR OFFICE WITH ANY PROBLEMS OR QUESTIONS ([phone])      
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
## 2022-01-15 ENCOUNTER — Other Ambulatory Visit: Payer: Self-pay | Admitting: Physical Medicine & Rehabilitation

## 2022-01-15 DIAGNOSIS — I82511 Chronic embolism and thrombosis of right femoral vein: Secondary | ICD-10-CM

## 2022-02-14 ENCOUNTER — Other Ambulatory Visit: Payer: Self-pay | Admitting: Physical Medicine & Rehabilitation

## 2022-02-27 ENCOUNTER — Telehealth: Payer: Self-pay

## 2022-02-27 DIAGNOSIS — F068 Other specified mental disorders due to known physiological condition: Secondary | ICD-10-CM

## 2022-02-27 NOTE — Telephone Encounter (Signed)
Refill request for Concerta, last filled #30 on 01/20/22 next appt 06/25/22

## 2022-02-28 MED ORDER — METHYLPHENIDATE HCL ER (OSM) 18 MG PO TBCR: 18.0000 mg | EXTENDED_RELEASE_TABLET | Freq: Every day | ORAL | 0 refills | Status: DC

## 2022-02-28 NOTE — Telephone Encounter (Signed)
Rx'es signed and sent

## 2022-03-20 ENCOUNTER — Ambulatory Visit
Admission: EM | Admit: 2022-03-20 | Discharge: 2022-03-20 | Disposition: A | Discharge status: Home / Self Care | Payer: 59 | Attending: Emergency Medicine | Admitting: Emergency Medicine

## 2022-03-20 DIAGNOSIS — M5441 Lumbago with sciatica, right side: Secondary | ICD-10-CM | POA: Diagnosis not present

## 2022-03-20 DIAGNOSIS — M5442 Lumbago with sciatica, left side: Secondary | ICD-10-CM

## 2022-03-20 MED ORDER — MELOXICAM 7.5 MG PO TABS: 7.5000 mg | ORAL_TABLET | Freq: Every day | ORAL | 0 refills | Status: DC

## 2022-03-20 MED ORDER — PREDNISONE 20 MG PO TABS: 40.0000 mg | ORAL_TABLET | Freq: Every day | ORAL | 0 refills | Status: DC

## 2022-03-20 MED ORDER — KETOROLAC TROMETHAMINE 30 MG/ML IJ SOLN
30.0000 mg | Freq: Once | INTRAMUSCULAR | Status: AC
  Administered 2022-03-20: 30 mg via INTRAMUSCULAR

## 2022-03-20 MED ORDER — CYCLOBENZAPRINE HCL 10 MG PO TABS: 10.0000 mg | ORAL_TABLET | Freq: Two times a day (BID) | ORAL | 0 refills | Status: DC | PRN
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
## 2022-03-20 NOTE — ED Triage Notes (Signed)
Patient presents to Urgent Care with complaints of back pain x 2 days. Has a hx of chronic back pain from his work he does. Taking aleve, last dose yesterday.

## 2022-03-20 NOTE — Discharge Instructions (Signed)
Your pain is most likely caused by irritation to the muscles or ligaments.   Starting tomorrow take prednisone every morning with food for 5 days, this will help to reduce inflammation that occurs with irritation and inflammation  Do not pick up meloxicam  You may use muscle relaxer twice daily as needed for additional comfort, be mindful this medication may make you drowsy  You may use heating pad in 15 minute intervals as needed for additional comfort, within the first 2-3 days you may find comfort in using ice in 10-15 minutes over affected area  Begin stretching affected area daily for 10 minutes as tolerated to further loosen muscles   When lying down place pillow underneath and between knees for support  Can try sleeping without pillow on firm mattress   Practice good posture: head back, shoulders back, chest forward, pelvis back and weight distributed evenly on both legs  If pain persist after recommended treatment or reoccurs if may be beneficial to follow up with orthopedic specialist for evaluation, this doctor specializes in the bones and can manage your symptoms long-term with options such as but not limited to imaging, medications or physical therapy

## 2022-04-09 ENCOUNTER — Telehealth: Payer: Self-pay

## 2022-04-09 NOTE — Telephone Encounter (Signed)
Refill of Concerta requested today.   Pharmacy confirmed a refill is already available. They will get it ready for pick-up.   Generic message left on caller's voicemail, [name] [phone].

## 2022-04-15 ENCOUNTER — Other Ambulatory Visit: Payer: Self-pay | Admitting: Physical Medicine & Rehabilitation

## 2022-05-15 ENCOUNTER — Other Ambulatory Visit: Payer: Self-pay

## 2022-05-15 ENCOUNTER — Other Ambulatory Visit: Payer: Self-pay | Admitting: Physical Medicine & Rehabilitation

## 2022-05-15 DIAGNOSIS — I82511 Chronic embolism and thrombosis of right femoral vein: Secondary | ICD-10-CM

## 2022-05-15 DIAGNOSIS — F068 Other specified mental disorders due to known physiological condition: Secondary | ICD-10-CM

## 2022-05-15 MED ORDER — METHYLPHENIDATE HCL ER (OSM) 18 MG PO TBCR: 18.0000 mg | EXTENDED_RELEASE_TABLET | Freq: Every day | ORAL | 0 refills | Status: DC

## 2022-05-15 NOTE — Telephone Encounter (Signed)
Refill for Concerta 18 MG refill. Please send to [facility] on [address].   PMP:  Filled  Written  ID  Drug  QTY  Days  Prescriber  RX #  Dispenser  Refill  Daily Dose*  Pymt Type  PMP  04/10/2022 45/99/7741 2  Concerta Er 18 Mg Tablet 30.00 30 Za Swa [id number] [facility] (0019) 0/0  [facility] [place] 02/28/2022 02/33/4356 2  Concerta Er 18 Mg Tablet 30.00 30 Za Swa [id number] [facility] (0019) 0/0  [facility] [place] 01/20/2022 29/09/1113 2  Concerta Er 18 Mg Tablet 30.00 30 Za Swa [id number] [facility] (0019) 0/0  Comm Ins [place]

## 2022-06-17 ENCOUNTER — Other Ambulatory Visit: Payer: Self-pay | Admitting: Physical Medicine & Rehabilitation

## 2022-06-17 DIAGNOSIS — R000 Tachycardia, unspecified: Secondary | ICD-10-CM

## 2022-06-17 DIAGNOSIS — I10 Essential (primary) hypertension: Secondary | ICD-10-CM

## 2022-06-23 ENCOUNTER — Telehealth: Payer: Self-pay

## 2022-06-23 ENCOUNTER — Encounter: Payer: Self-pay | Admitting: *Deleted

## 2022-06-23 DIAGNOSIS — S069X0S Unspecified intracranial injury without loss of consciousness, sequela: Secondary | ICD-10-CM

## 2022-06-23 MED ORDER — METHYLPHENIDATE HCL ER (OSM) 18 MG PO TBCR: 18.0000 mg | EXTENDED_RELEASE_TABLET | Freq: Every day | ORAL | 0 refills | Status: DC

## 2022-06-23 NOTE — Telephone Encounter (Signed)
Rx filled for 2 mos

## 2022-06-25 ENCOUNTER — Encounter: Payer: 59 | Attending: Physical Medicine & Rehabilitation | Admitting: Physical Medicine & Rehabilitation

## 2022-06-25 ENCOUNTER — Encounter: Payer: Self-pay | Admitting: Physical Medicine & Rehabilitation

## 2022-06-25 VITALS — BP 117/78 | HR 66 | Ht 72.0 in | Wt 194.0 lb

## 2022-06-25 DIAGNOSIS — S72111A Displaced fracture of greater trochanter of right femur, initial encounter for closed fracture: Secondary | ICD-10-CM | POA: Insufficient documentation

## 2022-06-25 DIAGNOSIS — R41840 Attention and concentration deficit: Secondary | ICD-10-CM | POA: Diagnosis not present

## 2022-06-25 DIAGNOSIS — Z79899 Other long term (current) drug therapy: Secondary | ICD-10-CM | POA: Insufficient documentation

## 2022-06-25 DIAGNOSIS — S42101A Fracture of unspecified part of scapula, right shoulder, initial encounter for closed fracture: Secondary | ICD-10-CM | POA: Diagnosis not present

## 2022-06-25 DIAGNOSIS — M25551 Pain in right hip: Secondary | ICD-10-CM

## 2022-06-25 DIAGNOSIS — F068 Other specified mental disorders due to known physiological condition: Secondary | ICD-10-CM | POA: Diagnosis not present

## 2022-06-25 DIAGNOSIS — S069X0S Unspecified intracranial injury without loss of consciousness, sequela: Secondary | ICD-10-CM | POA: Diagnosis not present

## 2022-06-25 DIAGNOSIS — G44329 Chronic post-traumatic headache, not intractable: Secondary | ICD-10-CM | POA: Diagnosis not present

## 2022-06-25 DIAGNOSIS — S066XAA Traumatic subarachnoid hemorrhage with loss of consciousness status unknown, initial encounter: Secondary | ICD-10-CM | POA: Diagnosis not present

## 2022-06-25 DIAGNOSIS — G5603 Carpal tunnel syndrome, bilateral upper limbs: Secondary | ICD-10-CM | POA: Insufficient documentation

## 2022-06-25 DIAGNOSIS — M79604 Pain in right leg: Secondary | ICD-10-CM

## 2022-06-25 NOTE — Patient Instructions (Signed)
ALWAYS FEEL FREE TO CALL OUR OFFICE WITH ANY PROBLEMS OR QUESTIONS ([phone])  **PLEASE NOTE** ALL MEDICATION REFILL REQUESTS (INCLUDING CONTROLLED SUBSTANCES) NEED TO BE MADE AT LEAST 7 DAYS PRIOR TO REFILL BEING DUE. ANY REFILL REQUESTS INSIDE THAT TIME FRAME MAY RESULT IN DELAYS IN RECEIVING YOUR PRESCRIPTION.                    
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
## 2022-07-14 ENCOUNTER — Other Ambulatory Visit: Payer: Self-pay | Admitting: Physical Medicine & Rehabilitation

## 2022-07-22 ENCOUNTER — Telehealth: Payer: Self-pay

## 2022-07-22 MED ORDER — METHYLPHENIDATE HCL 10 MG PO TABS: 10.0000 mg | ORAL_TABLET | Freq: Two times a day (BID) | ORAL | 0 refills | Status: DC

## 2022-07-22 NOTE — Telephone Encounter (Signed)
Luke Choi will need to start taking Methylphenidate. His insurance will no longer cover the Concerta starting 08/2022.   He only has a week supply of Concerta on hand. Per Luke Choi it should be okay to make the change to Methylphenidate now.   Call back ph [phone].  Filled  Written  ID  Drug  QTY  Days  Prescriber  RX #  Dispenser  Refill  Daily Dose*  Pymt Type  PMP  06/23/2022 06/23/2022 2  Concerta Er 18 Mg Tablet 30.00 30 Za Swa [id number] [facility] (0019) 0/0  Comm Ins [place] 05/15/2022 05/15/2022 2  Concerta Er 18 Mg Tablet 30.00 30 Za Swa [id number] [facility] (0019) 0/0  Comm Ins [place]

## 2022-07-22 NOTE — Telephone Encounter (Signed)
Changed him to ritalin 10mg  bid at breakfast and lunch

## 2022-08-13 ENCOUNTER — Other Ambulatory Visit: Payer: Self-pay | Admitting: Physical Medicine & Rehabilitation

## 2022-10-06 ENCOUNTER — Telehealth: Payer: Self-pay | Admitting: *Deleted

## 2022-10-06 MED ORDER — METHYLPHENIDATE HCL 10 MG PO TABS: 10.0000 mg | ORAL_TABLET | Freq: Two times a day (BID) | ORAL | 0 refills | Status: DC

## 2022-10-06 NOTE — Telephone Encounter (Signed)
Mr Borden's SO, Carolynn Serve, called for refill on his methylphenidate.  Per PMP last refill was 07/21/2022

## 2022-10-06 NOTE — Telephone Encounter (Signed)
Call placed to Luke Choi, regarding Methylphenidate. She didn't realize it was a BID medication and he was taking it daily.  Reviewed medication instructions with her, she verbalizes understanding. Ritalin e-scribed to pharmacy, she verbalizes understanding.

## 2022-10-12 ENCOUNTER — Other Ambulatory Visit: Payer: Self-pay | Admitting: Physical Medicine & Rehabilitation

## 2022-11-11 ENCOUNTER — Other Ambulatory Visit: Payer: Self-pay

## 2022-11-11 MED ORDER — METHYLPHENIDATE HCL 10 MG PO TABS: 10.0000 mg | ORAL_TABLET | Freq: Two times a day (BID) | ORAL | 0 refills | Status: DC

## 2022-11-11 NOTE — Telephone Encounter (Signed)
Need Methylphenidate 10 mg refilled.   PMP REPORT:  Filled  Written  ID  Drug  QTY  Days  Prescriber  RX #  Dispenser  Refill  Daily Dose*  Pymt Type  PMP  10/06/2022 10/06/2022 2  Methylphenidate 10 Mg Tablet 60.00 30 Eu Tho [id number] [facility] (0019) 0/0  Comm Ins [place] 07/22/2022 07/22/2022 2  Methylphenidate 10 Mg Tablet 60.00 30 Za Swa [id number] [facility] (0019) 0/0  Comm Ins [place] 06/23/2022 [id number] 2  Concerta Er 18 Mg Tablet 30.00 30 Za Swa [id number] [facility] (0019) 0/0  Comm Ins [place]

## 2022-12-16 ENCOUNTER — Other Ambulatory Visit: Payer: Self-pay

## 2022-12-16 MED ORDER — METHYLPHENIDATE HCL 10 MG PO TABS: 10.0000 mg | ORAL_TABLET | Freq: Two times a day (BID) | ORAL | 0 refills | Status: DC

## 2022-12-16 NOTE — Telephone Encounter (Signed)
Filled  Written  ID  Drug  QTY  Days  Prescriber  RX #  Dispenser  Refill  Daily Dose*  Pymt Type  PMP  11/12/2022 11/11/2022 2  Methylphenidate 10 Mg Tablet 60.00 30 Za Swa [id number] [facility] (0019) 0/0  Comm Ins [place] 10/06/2022 10/06/2022 2  Methylphenidate 10 Mg Tablet 60.00 30 Eu Tho [id number] [facility] (0019) 0/0  Comm Ins   Methylphenidate refill requested. Please send to [facility] on [address].

## 2022-12-16 NOTE — Telephone Encounter (Signed)
Rx written and sent to the pharmacy for this month and next. Thanks!

## 2022-12-23 ENCOUNTER — Other Ambulatory Visit: Payer: Self-pay | Admitting: Physical Medicine & Rehabilitation

## 2022-12-23 DIAGNOSIS — R000 Tachycardia, unspecified: Secondary | ICD-10-CM

## 2022-12-23 DIAGNOSIS — I10 Essential (primary) hypertension: Secondary | ICD-10-CM

## 2022-12-23 NOTE — Telephone Encounter (Signed)
I have not seen patient since 2022. Office visit required for refills. Thanks.

## 2022-12-24 ENCOUNTER — Encounter: Payer: 59 | Admitting: Physical Medicine & Rehabilitation

## 2023-01-07 ENCOUNTER — Encounter: Payer: 59 | Attending: Physical Medicine & Rehabilitation | Admitting: Physical Medicine & Rehabilitation

## 2023-01-07 ENCOUNTER — Encounter: Payer: Self-pay | Admitting: Physical Medicine & Rehabilitation

## 2023-01-07 VITALS — BP 127/80 | HR 64 | Ht 72.0 in | Wt 206.4 lb

## 2023-01-07 DIAGNOSIS — S069X0S Unspecified intracranial injury without loss of consciousness, sequela: Secondary | ICD-10-CM | POA: Diagnosis present

## 2023-01-07 DIAGNOSIS — F068 Other specified mental disorders due to known physiological condition: Secondary | ICD-10-CM | POA: Diagnosis present

## 2023-01-07 DIAGNOSIS — S069X0D Unspecified intracranial injury without loss of consciousness, subsequent encounter: Secondary | ICD-10-CM | POA: Insufficient documentation

## 2023-01-07 DIAGNOSIS — T07XXXA Unspecified multiple injuries, initial encounter: Secondary | ICD-10-CM | POA: Insufficient documentation

## 2023-01-07 MED ORDER — METHYLPHENIDATE HCL 10 MG PO TABS: 10.0000 mg | ORAL_TABLET | Freq: Two times a day (BID) | ORAL | 0 refills | Status: DC

## 2023-01-07 MED ORDER — METHYLPHENIDATE HCL 10 MG PO TABS
10.0000 mg | ORAL_TABLET | Freq: Two times a day (BID) | ORAL | 0 refills | Status: DC
Start: 2023-01-07 — End: 2023-03-13

## 2023-01-07 NOTE — Patient Instructions (Signed)
ALWAYS FEEL FREE TO CALL OUR OFFICE WITH ANY PROBLEMS OR QUESTIONS ([phone])  **PLEASE NOTE** ALL MEDICATION REFILL REQUESTS (INCLUDING CONTROLLED SUBSTANCES) NEED TO BE MADE AT LEAST 7 DAYS PRIOR TO REFILL BEING DUE. ANY REFILL REQUESTS INSIDE THAT TIME FRAME MAY RESULT IN DELAYS IN RECEIVING YOUR PRESCRIPTION.                    
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
## 2023-02-09 ENCOUNTER — Other Ambulatory Visit: Payer: Self-pay | Admitting: Physical Medicine & Rehabilitation

## 2023-03-12 ENCOUNTER — Telehealth: Payer: Self-pay | Admitting: *Deleted

## 2023-03-12 DIAGNOSIS — F068 Other specified mental disorders due to known physiological condition: Secondary | ICD-10-CM

## 2023-03-12 NOTE — Telephone Encounter (Signed)
Refill request methylphenidate 10 mg.

## 2023-03-13 MED ORDER — METHYLPHENIDATE HCL 10 MG PO TABS
10.0000 mg | ORAL_TABLET | Freq: Two times a day (BID) | ORAL | 0 refills | Status: DC
Start: 2023-03-13 — End: 2023-06-01

## 2023-03-13 NOTE — Telephone Encounter (Signed)
Sent in rx'es for this month and next

## 2023-05-11 ENCOUNTER — Other Ambulatory Visit: Payer: Self-pay | Admitting: Physical Medicine & Rehabilitation

## 2023-05-29 ENCOUNTER — Telehealth: Payer: Self-pay | Admitting: Physical Medicine & Rehabilitation

## 2023-05-29 DIAGNOSIS — R4189 Other symptoms and signs involving cognitive functions and awareness: Secondary | ICD-10-CM

## 2023-05-29 NOTE — Telephone Encounter (Signed)
Patient needs refill on methylphenidate sent to [facility] pharmacy in [place].

## 2023-06-01 MED ORDER — METHYLPHENIDATE HCL 10 MG PO TABS: 10.0000 mg | ORAL_TABLET | Freq: Two times a day (BID) | ORAL | 0 refills | Status: DC

## 2023-06-01 MED ORDER — METHYLPHENIDATE HCL 10 MG PO TABS
10.0000 mg | ORAL_TABLET | Freq: Two times a day (BID) | ORAL | 0 refills | Status: DC
Start: 2023-06-01 — End: 2023-07-08

## 2023-06-01 NOTE — Telephone Encounter (Signed)
Rxs written for this month and next

## 2023-06-01 NOTE — Addendum Note (Signed)
Addended by: Faith Rogue T on: 06/01/2023 12:21 PM   Modules accepted: Orders

## 2023-07-08 ENCOUNTER — Encounter: Payer: 59 | Attending: Physical Medicine & Rehabilitation | Admitting: Physical Medicine & Rehabilitation

## 2023-07-08 ENCOUNTER — Encounter: Payer: Self-pay | Admitting: Physical Medicine & Rehabilitation

## 2023-07-08 VITALS — BP 117/79 | HR 59 | Ht 72.0 in | Wt 202.0 lb

## 2023-07-08 DIAGNOSIS — Z79899 Other long term (current) drug therapy: Secondary | ICD-10-CM

## 2023-07-08 DIAGNOSIS — S069XAS Unspecified intracranial injury with loss of consciousness status unknown, sequela: Secondary | ICD-10-CM

## 2023-07-08 DIAGNOSIS — Z5181 Encounter for therapeutic drug level monitoring: Secondary | ICD-10-CM

## 2023-07-08 DIAGNOSIS — R4189 Other symptoms and signs involving cognitive functions and awareness: Secondary | ICD-10-CM | POA: Diagnosis present

## 2023-07-08 MED ORDER — METHYLPHENIDATE HCL 10 MG PO TABS
10.0000 mg | ORAL_TABLET | Freq: Two times a day (BID) | ORAL | 0 refills | Status: DC
Start: 2023-07-08 — End: 2023-10-07
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
## 2023-07-11 ENCOUNTER — Other Ambulatory Visit: Payer: Self-pay | Admitting: Physical Medicine & Rehabilitation

## 2023-07-14 ENCOUNTER — Encounter: Payer: Self-pay | Admitting: Physical Medicine & Rehabilitation

## 2023-07-14 LAB — TOXASSURE SELECT,+ANTIDEPR,UR

## 2023-07-19 ENCOUNTER — Other Ambulatory Visit: Payer: Self-pay | Admitting: Physical Medicine & Rehabilitation

## 2023-07-19 DIAGNOSIS — R000 Tachycardia, unspecified: Secondary | ICD-10-CM

## 2023-07-19 DIAGNOSIS — I10 Essential (primary) hypertension: Secondary | ICD-10-CM

## 2023-08-12 ENCOUNTER — Other Ambulatory Visit: Payer: Self-pay | Admitting: Physical Medicine & Rehabilitation

## 2023-10-07 ENCOUNTER — Telehealth: Payer: Self-pay | Admitting: Physical Medicine & Rehabilitation

## 2023-10-07 DIAGNOSIS — R4189 Other symptoms and signs involving cognitive functions and awareness: Secondary | ICD-10-CM

## 2023-10-07 MED ORDER — METHYLPHENIDATE HCL 10 MG PO TABS: 10.0000 mg | ORAL_TABLET | Freq: Two times a day (BID) | ORAL | 0 refills | Status: DC

## 2023-10-07 NOTE — Telephone Encounter (Signed)
 Refilled for now and march

## 2023-10-07 NOTE — Telephone Encounter (Signed)
 Luke Choi called in and LVM 2/25 @ 1:47pm requesting medication refill on methylphenidate (RITALIN) 10 MG tablet  , uses [facility] [address] in [place]

## 2023-10-07 NOTE — Telephone Encounter (Signed)
Noified

## 2023-12-29 ENCOUNTER — Telehealth: Payer: Self-pay | Admitting: *Deleted

## 2023-12-29 DIAGNOSIS — S069XAS Unspecified intracranial injury with loss of consciousness status unknown, sequela: Secondary | ICD-10-CM

## 2023-12-29 DIAGNOSIS — R4189 Other symptoms and signs involving cognitive functions and awareness: Secondary | ICD-10-CM

## 2023-12-29 MED ORDER — METHYLPHENIDATE HCL 10 MG PO TABS: 10.0000 mg | ORAL_TABLET | Freq: Two times a day (BID) | ORAL | 0 refills | Status: DC

## 2023-12-29 NOTE — Telephone Encounter (Signed)
 Luke Choi needs refill on his methylphenidate 

## 2023-12-29 NOTE — Telephone Encounter (Signed)
 Rxs sent

## 2023-12-30 ENCOUNTER — Encounter: Payer: 59 | Attending: Physical Medicine & Rehabilitation | Admitting: Physical Medicine & Rehabilitation

## 2023-12-30 ENCOUNTER — Encounter: Payer: Self-pay | Admitting: Physical Medicine & Rehabilitation

## 2023-12-30 VITALS — BP 124/77 | HR 78 | Ht 72.0 in | Wt 199.4 lb

## 2023-12-30 DIAGNOSIS — F32A Depression, unspecified: Secondary | ICD-10-CM | POA: Insufficient documentation

## 2023-12-30 DIAGNOSIS — R5383 Other fatigue: Secondary | ICD-10-CM | POA: Diagnosis present

## 2023-12-30 DIAGNOSIS — S069XAS Unspecified intracranial injury with loss of consciousness status unknown, sequela: Secondary | ICD-10-CM | POA: Diagnosis not present

## 2023-12-30 DIAGNOSIS — R4189 Other symptoms and signs involving cognitive functions and awareness: Secondary | ICD-10-CM | POA: Insufficient documentation
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
## 2023-12-30 NOTE — Patient Instructions (Signed)
 ALWAYS FEEL FREE TO CALL OUR OFFICE WITH ANY PROBLEMS OR QUESTIONS [phone])  **PLEASE NOTE** ALL MEDICATION REFILL REQUESTS (INCLUDING CONTROLLED SUBSTANCES) NEED TO BE MADE AT LEAST 7 DAYS PRIOR TO REFILL BEING DUE. ANY REFILL REQUESTS INSIDE THAT TIME FRAME MAY RESULT IN DELAYS IN RECEIVING YOUR PRESCRIPTION.    Cut your ritalin  in half to 5mg  twice daily. If things go ok, just stick with it.

## 2024-01-21 ENCOUNTER — Other Ambulatory Visit: Payer: Self-pay | Admitting: Physical Medicine & Rehabilitation

## 2024-02-22 ENCOUNTER — Other Ambulatory Visit: Payer: Self-pay | Admitting: Physical Medicine & Rehabilitation

## 2024-06-02 ENCOUNTER — Telehealth: Payer: Self-pay

## 2024-06-29 ENCOUNTER — Encounter: Payer: Self-pay | Admitting: Physical Medicine & Rehabilitation

## 2024-06-29 ENCOUNTER — Encounter: Attending: Physical Medicine & Rehabilitation | Admitting: Physical Medicine & Rehabilitation

## 2024-06-29 VITALS — BP 135/86 | HR 84 | Ht 72.0 in | Wt 190.0 lb

## 2024-06-29 DIAGNOSIS — S069XAS Unspecified intracranial injury with loss of consciousness status unknown, sequela: Secondary | ICD-10-CM | POA: Insufficient documentation

## 2024-06-29 DIAGNOSIS — G479 Sleep disorder, unspecified: Secondary | ICD-10-CM | POA: Insufficient documentation

## 2024-06-29 DIAGNOSIS — S069X3S Unspecified intracranial injury with loss of consciousness of 1 hour to 5 hours 59 minutes, sequela: Secondary | ICD-10-CM | POA: Insufficient documentation

## 2024-06-29 DIAGNOSIS — F329 Major depressive disorder, single episode, unspecified: Secondary | ICD-10-CM | POA: Insufficient documentation

## 2024-06-29 DIAGNOSIS — R4189 Other symptoms and signs involving cognitive functions and awareness: Secondary | ICD-10-CM | POA: Insufficient documentation

## 2024-06-29 MED ORDER — QUETIAPINE FUMARATE 100 MG PO TABS: 100.0000 mg | ORAL_TABLET | Freq: Every day | ORAL | 5 refills | Status: AC

## 2024-06-29 MED ORDER — CITALOPRAM HYDROBROMIDE 10 MG PO TABS: ORAL_TABLET | ORAL | 5 refills | Status: AC
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
## 2024-06-29 NOTE — Patient Instructions (Signed)
 ALWAYS FEEL FREE TO CALL OUR OFFICE WITH ANY PROBLEMS OR QUESTIONS [phone])  **PLEASE NOTE** ALL MEDICATION REFILL REQUESTS (INCLUDING CONTROLLED SUBSTANCES) NEED TO BE MADE AT LEAST 7 DAYS PRIOR TO REFILL BEING DUE. ANY REFILL REQUESTS INSIDE THAT TIME FRAME MAY RESULT IN DELAYS IN RECEIVING YOUR PRESCRIPTION.

## 2024-07-06 NOTE — Telephone Encounter (Signed)
 Erroneous encounter

## 2024-09-07 ENCOUNTER — Observation Stay (HOSPITAL_COMMUNITY)

## 2024-09-07 ENCOUNTER — Other Ambulatory Visit: Payer: Self-pay

## 2024-09-07 ENCOUNTER — Encounter (HOSPITAL_COMMUNITY): Payer: Self-pay | Admitting: Family Medicine

## 2024-09-07 ENCOUNTER — Observation Stay (HOSPITAL_COMMUNITY)
Admission: EM | Admit: 2024-09-07 | Discharge: 2024-09-08 | Disposition: A | Discharge status: Home / Self Care | Attending: Internal Medicine | Admitting: Internal Medicine

## 2024-09-07 ENCOUNTER — Emergency Department (HOSPITAL_COMMUNITY)

## 2024-09-07 DIAGNOSIS — R410 Disorientation, unspecified: Secondary | ICD-10-CM

## 2024-09-07 DIAGNOSIS — D72829 Elevated white blood cell count, unspecified: Secondary | ICD-10-CM | POA: Insufficient documentation

## 2024-09-07 DIAGNOSIS — R4189 Other symptoms and signs involving cognitive functions and awareness: Secondary | ICD-10-CM | POA: Insufficient documentation

## 2024-09-07 DIAGNOSIS — G4700 Insomnia, unspecified: Secondary | ICD-10-CM | POA: Diagnosis not present

## 2024-09-07 DIAGNOSIS — G4089 Other seizures: Secondary | ICD-10-CM | POA: Diagnosis not present

## 2024-09-07 DIAGNOSIS — F1290 Cannabis use, unspecified, uncomplicated: Secondary | ICD-10-CM | POA: Insufficient documentation

## 2024-09-07 DIAGNOSIS — F1090 Alcohol use, unspecified, uncomplicated: Secondary | ICD-10-CM | POA: Insufficient documentation

## 2024-09-07 DIAGNOSIS — F32A Depression, unspecified: Secondary | ICD-10-CM | POA: Diagnosis not present

## 2024-09-07 DIAGNOSIS — R001 Bradycardia, unspecified: Secondary | ICD-10-CM | POA: Diagnosis not present

## 2024-09-07 DIAGNOSIS — S069XAA Unspecified intracranial injury with loss of consciousness status unknown, initial encounter: Secondary | ICD-10-CM | POA: Diagnosis present

## 2024-09-07 DIAGNOSIS — S069XAS Unspecified intracranial injury with loss of consciousness status unknown, sequela: Secondary | ICD-10-CM | POA: Diagnosis not present

## 2024-09-07 DIAGNOSIS — R739 Hyperglycemia, unspecified: Secondary | ICD-10-CM | POA: Diagnosis not present

## 2024-09-07 DIAGNOSIS — I10 Essential (primary) hypertension: Secondary | ICD-10-CM | POA: Diagnosis present

## 2024-09-07 DIAGNOSIS — G44309 Post-traumatic headache, unspecified, not intractable: Secondary | ICD-10-CM | POA: Insufficient documentation

## 2024-09-07 DIAGNOSIS — R569 Unspecified convulsions: Principal | ICD-10-CM | POA: Insufficient documentation

## 2024-09-07 HISTORY — DX: Unspecified intracranial injury with loss of consciousness status unknown, initial encounter: S06.9XAA

## 2024-09-07 LAB — CBC WITH DIFFERENTIAL/PLATELET
Abs Immature Granulocytes: 0.14 10*3/uL — ABNORMAL HIGH (ref 0.00–0.07)
Basophils Absolute: 0.1 10*3/uL (ref 0.0–0.1)
Basophils Relative: 1 %
Eosinophils Absolute: 0 10*3/uL (ref 0.0–0.5)
Eosinophils Relative: 0 %
HCT: 45.7 % (ref 39.0–52.0)
Hemoglobin: 15.1 g/dL (ref 13.0–17.0)
Immature Granulocytes: 1 %
Lymphocytes Relative: 11 %
Lymphs Abs: 1.6 10*3/uL (ref 0.7–4.0)
MCH: 30.7 pg (ref 26.0–34.0)
MCHC: 33 g/dL (ref 30.0–36.0)
MCV: 92.9 fL (ref 80.0–100.0)
Monocytes Absolute: 0.8 10*3/uL (ref 0.1–1.0)
Monocytes Relative: 5 %
Neutro Abs: 11.8 10*3/uL — ABNORMAL HIGH (ref 1.7–7.7)
Neutrophils Relative %: 82 %
Platelets: 331 10*3/uL (ref 150–400)
RBC: 4.92 MIL/uL (ref 4.22–5.81)
RDW: 12.8 % (ref 11.5–15.5)
WBC: 14.5 10*3/uL — ABNORMAL HIGH (ref 4.0–10.5)
nRBC: 0 % (ref 0.0–0.2)

## 2024-09-07 LAB — COMPREHENSIVE METABOLIC PANEL WITH GFR
ALT: 29 U/L (ref 0–44)
AST: 38 U/L (ref 15–41)
Albumin: 4.9 g/dL (ref 3.5–5.0)
Alkaline Phosphatase: 72 U/L (ref 38–126)
Anion gap: 29 — ABNORMAL HIGH (ref 5–15)
BUN: 11 mg/dL (ref 6–20)
CO2: 13 mmol/L — ABNORMAL LOW (ref 22–32)
Calcium: 9.7 mg/dL (ref 8.9–10.3)
Chloride: 96 mmol/L — ABNORMAL LOW (ref 98–111)
Creatinine, Ser: 1.09 mg/dL (ref 0.61–1.24)
GFR, Estimated: >60 mL/min
Glucose, Bld: 235 mg/dL — ABNORMAL HIGH (ref 70–99)
Potassium: 3.5 mmol/L (ref 3.5–5.1)
Sodium: 138 mmol/L (ref 135–145)
Total Bilirubin: 0.5 mg/dL (ref 0.0–1.2)
Total Protein: 8.2 g/dL — ABNORMAL HIGH (ref 6.5–8.1)

## 2024-09-07 LAB — ETHANOL: Alcohol, Ethyl (B): <15 mg/dL

## 2024-09-07 LAB — PRO BRAIN NATRIURETIC PEPTIDE: Pro Brain Natriuretic Peptide: 1789 pg/mL — ABNORMAL HIGH

## 2024-09-07 LAB — AMMONIA: Ammonia: 23 umol/L (ref 9–35)

## 2024-09-07 LAB — PHOSPHORUS: Phosphorus: 3.3 mg/dL (ref 2.5–4.6)

## 2024-09-07 LAB — MAGNESIUM: Magnesium: 2.3 mg/dL (ref 1.7–2.4)

## 2024-09-07 MED ORDER — SODIUM CHLORIDE 0.9 % IV SOLN: INTRAVENOUS | Status: DC

## 2024-09-07 MED ORDER — LEVETIRACETAM (KEPPRA) 500 MG/5 ML ADULT IV PUSH: 500.0000 mg | Freq: Two times a day (BID) | INTRAVENOUS | Status: DC

## 2024-09-07 MED ORDER — FLEET ENEMA RE ENEM: 1.0000 | ENEMA | Freq: Once | RECTAL | Status: DC | PRN

## 2024-09-07 MED ORDER — LORAZEPAM 2 MG/ML IJ SOLN
INTRAMUSCULAR | Status: AC
  Filled 2024-09-07: qty 2

## 2024-09-07 MED ORDER — LORAZEPAM 2 MG/ML IJ SOLN
2.0000 mg | Freq: Once | INTRAMUSCULAR | Status: AC
  Administered 2024-09-07: 2 mg via INTRAVENOUS

## 2024-09-07 MED ORDER — OXYCODONE HCL 5 MG PO TABS: 5.0000 mg | ORAL_TABLET | ORAL | Status: DC | PRN

## 2024-09-07 MED ORDER — LEVETIRACETAM 500 MG PO TABS
500.0000 mg | ORAL_TABLET | Freq: Two times a day (BID) | ORAL | Status: DC
  Administered 2024-09-07 – 2024-09-08 (×2): 500 mg via ORAL
  Filled 2024-09-07 (×2): qty 1

## 2024-09-07 MED ORDER — ONDANSETRON HCL 4 MG/2ML IJ SOLN
4.0000 mg | Freq: Once | INTRAMUSCULAR | Status: AC
  Administered 2024-09-07: 4 mg via INTRAVENOUS

## 2024-09-07 MED ORDER — HYDROMORPHONE HCL 1 MG/ML IJ SOLN: 0.5000 mg | INTRAMUSCULAR | Status: DC | PRN

## 2024-09-07 MED ORDER — SODIUM CHLORIDE 0.9% FLUSH
3.0000 mL | Freq: Two times a day (BID) | INTRAVENOUS | Status: DC
  Administered 2024-09-08: 3 mL via INTRAVENOUS

## 2024-09-07 MED ORDER — ONDANSETRON HCL 4 MG PO TABS: 4.0000 mg | ORAL_TABLET | Freq: Four times a day (QID) | ORAL | Status: DC | PRN

## 2024-09-07 MED ORDER — QUETIAPINE FUMARATE 100 MG PO TABS
100.0000 mg | ORAL_TABLET | Freq: Every day | ORAL | Status: DC
  Administered 2024-09-07: 100 mg via ORAL
  Filled 2024-09-07: qty 1

## 2024-09-07 MED ORDER — IPRATROPIUM BROMIDE 0.02 % IN SOLN: 0.5000 mg | Freq: Four times a day (QID) | RESPIRATORY_TRACT | Status: DC | PRN

## 2024-09-07 MED ORDER — ACETAMINOPHEN 650 MG RE SUPP: 650.0000 mg | Freq: Four times a day (QID) | RECTAL | Status: DC | PRN

## 2024-09-07 MED ORDER — ZOLPIDEM TARTRATE 5 MG PO TABS: 5.0000 mg | ORAL_TABLET | Freq: Every evening | ORAL | Status: DC | PRN

## 2024-09-07 MED ORDER — LORAZEPAM 2 MG/ML IJ SOLN: 2.0000 mg | INTRAMUSCULAR | Status: DC | PRN

## 2024-09-07 MED ORDER — SODIUM CHLORIDE 0.9% FLUSH
3.0000 mL | Freq: Two times a day (BID) | INTRAVENOUS | Status: DC
  Administered 2024-09-07 – 2024-09-08 (×2): 3 mL via INTRAVENOUS

## 2024-09-07 MED ORDER — CITALOPRAM HYDROBROMIDE 10 MG PO TABS
10.0000 mg | ORAL_TABLET | Freq: Every day | ORAL | Status: DC
  Administered 2024-09-08: 10 mg via ORAL
  Filled 2024-09-07: qty 1

## 2024-09-07 MED ORDER — ACETAMINOPHEN 325 MG PO TABS
650.0000 mg | ORAL_TABLET | Freq: Four times a day (QID) | ORAL | Status: DC | PRN
  Administered 2024-09-07: 650 mg via ORAL
  Filled 2024-09-07: qty 2

## 2024-09-07 MED ORDER — LACTATED RINGERS IV BOLUS
1000.0000 mL | Freq: Once | INTRAVENOUS | Status: AC
  Administered 2024-09-07: 1000 mL via INTRAVENOUS

## 2024-09-07 MED ORDER — HEPARIN SODIUM (PORCINE) 5000 UNIT/ML IJ SOLN: 5000.0000 [IU] | Freq: Three times a day (TID) | INTRAMUSCULAR | Status: DC

## 2024-09-07 MED ORDER — LEVETIRACETAM (KEPPRA) 500 MG/5 ML ADULT IV PUSH
2000.0000 mg | Freq: Once | INTRAVENOUS | Status: AC
  Administered 2024-09-07: 2000 mg via INTRAVENOUS

## 2024-09-07 MED ORDER — ONDANSETRON HCL 4 MG/2ML IJ SOLN: 4.0000 mg | Freq: Four times a day (QID) | INTRAMUSCULAR | Status: DC | PRN

## 2024-09-07 MED ORDER — HYDRALAZINE HCL 20 MG/ML IJ SOLN: 10.0000 mg | INTRAMUSCULAR | Status: DC | PRN

## 2024-09-07 MED ORDER — SENNOSIDES-DOCUSATE SODIUM 8.6-50 MG PO TABS: 1.0000 | ORAL_TABLET | Freq: Every evening | ORAL | Status: DC | PRN

## 2024-09-07 MED ORDER — MELATONIN 3 MG PO TABS: 6.0000 mg | ORAL_TABLET | Freq: Every evening | ORAL | Status: DC | PRN

## 2024-09-07 NOTE — ED Notes (Signed)
 Pt keeps moving around and trying to get up. PT's wife and mother-in-law are at bedside.

## 2024-09-07 NOTE — Assessment & Plan Note (Signed)
 Rate 57-1 07, currently 72, stable

## 2024-09-07 NOTE — Assessment & Plan Note (Signed)
 Continue melatonin, as needed trazodone 

## 2024-09-07 NOTE — Assessment & Plan Note (Signed)
 Noted, monitoring -Associated with confusion, some agitation-likely sedated postictal

## 2024-09-07 NOTE — ED Triage Notes (Signed)
 Pt coming in from work where he collapsed with seizure for 12 min. Pt fell. Pt was placed in c coller but was unable to keep on. Pt was agitated for ems during transport. Pt given 2.5 mg haldol  IM and 2 doses of 2.5 mg midazolam . P[t was restrained on his way in. This is his first seizure. Pt not on any antiepileptics. Pt was bagged by ems for 5 min.  Ems vitals 155hr  Cbg 232  96% ra

## 2024-09-07 NOTE — Assessment & Plan Note (Signed)
 Monitor CBG, obtaining A1c -POA hypoglycemic blood sugar of 235 -Monitoring closely, ruling out diabetes

## 2024-09-07 NOTE — Progress Notes (Signed)
 EEG complete - results pending

## 2024-09-07 NOTE — Progress Notes (Signed)
 2130 : Pt left for MRI 2320: Pt back from MRI. Telemetry monitoring reattached. Seizure precautions in place. Discussed POC with pt. Pt aware of need for sputum and urine samples. Pt states he has COPD and has a chronic cough, sometimes with dark sputum.

## 2024-09-07 NOTE — ED Notes (Signed)
 Pt keeps moving around and keeps trying to get up.
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
## 2024-09-07 NOTE — Assessment & Plan Note (Signed)
 Current analgesics
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
## 2024-09-07 NOTE — Assessment & Plan Note (Signed)
 Continuing home medication of Celexa , Seroquel 
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
## 2024-09-07 NOTE — Procedures (Signed)
 Patient Name: Johnwesley Lederman  MRN: [id number]  Epilepsy Attending: Arlin MALVA Krebs  Referring Physician/Provider: Remi Pippin, NP  Date: 09/07/2024 Duration: 23.12 mins  Patient history: 54yo M with seizure. EEG to evaluate for seizure  Level of alertness: Awake  AEDs during EEG study: LEV, Ativan   Technical aspects: This EEG study was done with scalp electrodes positioned according to the 10-20 International system of electrode placement. Electrical activity was reviewed with band pass filter of 1-70Hz , sensitivity of 7 uV/mm, display speed of 32mm/sec with a 60Hz  notched filter applied as appropriate. EEG data were recorded continuously and digitally stored.  Video monitoring was available and reviewed as appropriate.  Description: EEG showed excessive amount of 15 to 18 Hz beta activity distributed symmetrically and diffusely. Hyperventilation and photic stimulation were not performed.     ABNORMALITY - Excessive beta, generalized  IMPRESSION: This study is within normal limits. The excessive beta activity seen in the background is most likely due to the effect of benzodiazepine and is a benign EEG pattern. No seizures or epileptiform discharges were seen throughout the recording.  A normal interictal EEG does not exclude the diagnosis of epilepsy.   Jandiel Magallanes O Gabby Rackers

## 2024-09-07 NOTE — Assessment & Plan Note (Signed)
 WBC of 14.5 afebrile,  -Likely reactive, -Monitoring closely
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
## 2024-09-07 NOTE — Assessment & Plan Note (Signed)
 Blood pressure remains stable, monitoring closely -Not on any BP meds at home

## 2024-09-07 NOTE — ED Provider Notes (Signed)
 " [place] EMERGENCY DEPARTMENT AT [facility] Provider Note   CSN: [id number] Arrival date & time: 09/07/24  1059     Patient presents with: Seizures   Luke Choi is a 54 y.o. male.    Seizures   Presents due to concern for seizure activity.  According to EMS, patient was at work.  Coworkers heard a crash and subsequent walked in and found the patient seizing.  Full body convulsions.  Seized for approximate 10 minutes.  Subsequently responsive thereafter.  Upon arrival by EMS, patient had a pulse.  Assisted breathing with some nonrebreather given patient was postictal.  Patient then became agitated on the way into the hospital.  Received midazolam  2.5 mg x 2 IM as well as 2.5 mg of Haldol .  According to wife at bedside.  No previous history of seizure.  TBI 5 years ago.  Has not been on seizure medications since then.  Been doing well.  Has not been cleared by any kind of headaches vision changes, does not complain about it, chest pain or shortness of breath.  Tolerating p.o.     Prior to Admission medications  Medication Sig Start Date End Date Taking? Authorizing Provider  citalopram  (CELEXA ) 10 MG tablet TAKE 1 TABLET(10 MG) BY MOUTH AT BEDTIME 06/29/24   Babs Arthea DASEN, MD  Melatonin 3 MG TABS Take 2 tablets (6 mg total) by mouth at bedtime as needed (for insomnia). 07/12/19   Angiulli, Toribio PARAS, PA-C  QUEtiapine  (SEROQUEL ) 100 MG tablet Take 1 tablet (100 mg total) by mouth at bedtime. 06/29/24   Babs Arthea DASEN, MD    Allergies: Bee venom    Review of Systems  Neurological:  Positive for seizures.    Updated Vital Signs BP 100/82   Pulse 72   Temp (!) 97.1 F (36.2 C) (Temporal)   Resp 19   Wt 86 kg Comment: estimated  SpO2 100%   BMI 25.71 kg/m   Physical Exam Vitals and nursing note reviewed.  Constitutional:      General: He is not in acute distress.    Appearance: He is well-developed.  HENT:     Head: Normocephalic and atraumatic.   Eyes:     Conjunctiva/sclera: Conjunctivae normal.  Cardiovascular:     Rate and Rhythm: Normal rate and regular rhythm.     Heart sounds: No murmur heard. Pulmonary:     Effort: Pulmonary effort is normal. No respiratory distress.     Breath sounds: Normal breath sounds.  Abdominal:     Palpations: Abdomen is soft.     Tenderness: There is no abdominal tenderness.  Musculoskeletal:        General: No swelling.     Cervical back: Neck supple.  Skin:    General: Skin is warm and dry.     Capillary Refill: Capillary refill takes less than 2 seconds.  Neurological:     Mental Status: He is alert.     GCS: GCS eye subscore is 4. GCS verbal subscore is 4. GCS motor subscore is 6.     Comments: Pulse equal reactive bilaterally.  GCS of 14.  Eyes open spontaneously.  Some confusion but able to abate all commands with bilateral grip.   Moving all extremities.  No facial droop that could be appreciated.  No focal deficits.  Psychiatric:        Mood and Affect: Mood normal.     (all labs ordered are listed, but only abnormal results are displayed)  Labs Reviewed  CBC WITH DIFFERENTIAL/PLATELET - Abnormal; Notable for the following components:      Result Value   WBC 14.5 (*)    Neutro Abs 11.8 (*)    Abs Immature Granulocytes 0.14 (*)    All other components within normal limits  COMPREHENSIVE METABOLIC PANEL WITH GFR - Abnormal; Notable for the following components:   Chloride 96 (*)    CO2 13 (*)    Glucose, Bld 235 (*)    Total Protein 8.2 (*)    Anion gap 29 (*)    All other components within normal limits  EXPECTORATED SPUTUM ASSESSMENT W GRAM STAIN, RFLX TO RESP C  ETHANOL  URINALYSIS, ROUTINE W REFLEX MICROSCOPIC  URINE DRUG SCREEN  AMMONIA  HIV ANTIBODY (ROUTINE TESTING W REFLEX)  MAGNESIUM   PHOSPHORUS  PRO BRAIN NATRIURETIC PEPTIDE  CBG MONITORING, ED    EKG: EKG Interpretation Date/Time:  Wednesday September 07 2024 11:02:12 EST Ventricular Rate:  116 PR  Interval:  160 QRS Duration:  96 QT Interval:  348 QTC Calculation: 484 R Axis:   53  Text Interpretation: Sinus tachycardia Probable left atrial enlargement Anteroseptal infarct, old Borderline ST depression, anterolateral leads Baseline wander in lead(s) V1 V2 V3 V5 V6 Partial missing lead(s): V2 Confirmed by Simon Rea [phone]) on 09/07/2024 11:21:30 AM  Radiology: CT Head Wo Contrast Result Date: 09/07/2024 EXAM: CT HEAD WITHOUT CONTRAST 09/07/2024 12:12:38 PM TECHNIQUE: CT of the head was performed without the administration of intravenous contrast. Automated exposure control, iterative reconstruction, and/or weight based adjustment of the mA/kV was utilized to reduce the radiation dose to as low as reasonably achievable. COMPARISON: CT head without contrast 05/05/2019. CLINICAL HISTORY: First time seizure. History of traumatic brain injury. FINDINGS: BRAIN AND VENTRICLES: No acute hemorrhage. No evidence of acute infarct. No hydrocephalus. No extra-axial collection. No mass effect or midline shift. ORBITS: No acute abnormality. SINUSES: Largely clear sinuses. Postoperative changes are present in the inferior posterior left maxillary sinus. SOFT TISSUES AND SKULL: No acute soft tissue abnormality. No skull fracture. Significant beam hardening artifact is present secondary to draped over the patient. IMPRESSION: 1. No acute intracranial abnormality. Electronically signed by: Lonni Necessary MD 09/07/2024 12:25 PM EST RP Workstation: [id number]   CT Cervical Spine Wo Contrast Result Date: 09/07/2024 CLINICAL DATA:  New seizure and fall. Evaluate for cervical spine fracture. EXAM: CT CERVICAL SPINE WITHOUT CONTRAST TECHNIQUE: Multidetector CT imaging of the cervical spine was performed without intravenous contrast. Multiplanar CT image reconstructions were also generated. RADIATION DOSE REDUCTION: This exam was performed according to the departmental dose-optimization program which includes  automated exposure control, adjustment of the mA and/or kV according to patient size and/or use of iterative reconstruction technique. COMPARISON:  CT cervical spine 04/20/2019. FINDINGS: Alignment: Normal. Skull base and vertebrae: No evidence of acute cervical spine fracture or traumatic subluxation. Soft tissues and spinal canal: No prevertebral fluid or swelling. No visible canal hematoma. Disc levels: Multilevel spondylosis with disc space narrowing, uncinate spurring and facet hypertrophy, similar to previous CT. No large disc herniation identified. Mild spinal stenosis and mild foraminal narrowing at C5-6 and C6-7. Upper chest: Progressive scarring at both lung apices with associated parenchymal calcifications on the right. Underlying emphysematous changes. Probable posttraumatic deformity of the medial left clavicle. Other: None. IMPRESSION: 1. No evidence of acute cervical spine fracture, traumatic subluxation or static signs of instability. 2. Multilevel cervical spondylosis, similar to previous CT. 3. Progressive scarring at both lung apices with associated parenchymal calcifications on the  right. Electronically Signed   By: Elsie Perone M.D.   On: 09/07/2024 12:24     Procedures   Medications Ordered in the ED  LORazepam  (ATIVAN ) 2 MG/ML injection (has no administration in time range)  LORazepam  (ATIVAN ) 2 MG/ML injection (has no administration in time range)  sodium chloride  flush (NS) 0.9 % injection 3 mL (has no administration in time range)  sodium chloride  flush (NS) 0.9 % injection 3 mL (has no administration in time range)  acetaminophen  (TYLENOL ) tablet 650 mg (has no administration in time range)    Or  acetaminophen  (TYLENOL ) suppository 650 mg (has no administration in time range)  oxyCODONE  (Oxy IR/ROXICODONE ) immediate release tablet 5 mg (has no administration in time range)  HYDROmorphone  (DILAUDID ) injection 0.5-1 mg (has no administration in time range)  zolpidem   (AMBIEN ) tablet 5 mg (has no administration in time range)  senna-docusate (Senokot-S) tablet 1 tablet (has no administration in time range)  sodium phosphate  (FLEET) enema 1 enema (has no administration in time range)  ondansetron  (ZOFRAN ) tablet 4 mg (has no administration in time range)    Or  ondansetron  (ZOFRAN ) injection 4 mg (has no administration in time range)  ipratropium (ATROVENT ) nebulizer solution 0.5 mg (has no administration in time range)  hydrALAZINE  (APRESOLINE ) injection 10 mg (has no administration in time range)  LORazepam  (ATIVAN ) injection 2 mg (has no administration in time range)  0.9 %  sodium chloride  infusion (has no administration in time range)  LORazepam  (ATIVAN ) injection 2 mg (2 mg Intravenous Given 09/07/24 1107)  levETIRAcetam  (KEPPRA ) undiluted injection 2,000 mg (2,000 mg Intravenous Given 09/07/24 1108)  LORazepam  (ATIVAN ) injection 2 mg (2 mg Intravenous Given 09/07/24 1107)  ondansetron  (ZOFRAN ) injection 4 mg (4 mg Intravenous Given 09/07/24 1131)                                    Medical Decision Making Amount and/or Complexity of Data Reviewed Labs: ordered. Radiology: ordered.  Risk Prescription drug management. Decision regarding hospitalization.     HPI:    Presents due to concern for seizure activity.  According to EMS, patient was at work.  Coworkers heard a crash and subsequent walked in and found the patient seizing.  Full body convulsions.  Seized for approximate 10 minutes.  Subsequently responsive thereafter.  Upon arrival by EMS, patient had a pulse.  Assisted breathing with some nonrebreather given patient was postictal.  Patient then became agitated on the way into the hospital.  Received midazolam  2.5 mg x 2 IM as well as 2.5 mg of Haldol .  According to wife at bedside.  No previous history of seizure.  TBI 5 years ago.  Has not been on seizure medications since then.  Been doing well.  Has not been cleared by any kind of  headaches vision changes, does not complain about it, chest pain or shortness of breath.  Tolerating p.o.    MDM:   Upon examination, patient altered.  Exam as follows:GCS of 14.  Eyes open spontaneously.  Some confusion but able to abate all commands with bilateral grip.  Moving all extremities.  No facial droop that could be appreciated.  No focal deficits.   Likely a first-time seizure in the setting of TBI.  Patient agitated.  Received another 2 mg IV Ativan  here in the ED.  Still agitated so give another 2 mg IV Ativan  for a total of 4  initially.  Patient also received a 20 mg/kg bolus of Keppra  loading dose to concern for first-time seizure.  Patient then required another 2 mg of IV Ativan  in order to be still during the CT scanner.  Patient placed on cardiac monitoring.  EKG shows sinus tachycardia.  No arrhythmia appreciated.  Given concern for first-time seizure, will obtain CT head throughout subdural epidural.  Also obtain CT cervical spine.  No history of withdrawal seizures.  No history of drug abuse per wife.    Reevaluation:   Upon reexamination, patient hemodynamically stable.  Mentating a little bit better.  Agitated.  Still GCS of 14.   No meningismus signs.  No concerns for any kind of meningitis type picture explain patient's symptoms.  CT scan negative.  Negative for any kind of subdural epidural.  Negative for any kind of subarachnoid hemorrhage.  CT C-spine clear.  Neurology consult for first-time seizure.  I think this is likely a first-time seizure given history of TBI.  There is always a possibility that he could have syncopized and hit his head and did not seize but given his risk factors, I think is more likely to be a seizure than fall.  Once again, patient already Keppra  loaded.   Interventions: Keppra  20 mg/kg, ativan  total 6 mg iv,   EKG Interpreted by Me: sinus    Cardiac Tele Interpreted by Me: sinus    I have independently interpreted the  CT   images and agree with the radiologist finding   Social Determinant of Health: denies drugs/alcohol    Disposition and Follow Up: admit  CRITICAL CARE Performed by: Lavonia LOISE Pat   Total critical care time: 35 minutes  Critical care time was exclusive of separately billable procedures and treating other patients.  Critical care was necessary to treat or prevent imminent or life-threatening deterioration.  Critical care was time spent personally by me on the following activities: development of treatment plan with patient and/or surrogate as well as nursing, discussions with consultants, evaluation of patient's response to treatment, examination of patient, obtaining history from patient or surrogate, ordering and performing treatments and interventions, ordering and review of laboratory studies, ordering and review of radiographic studies, pulse oximetry and re-evaluation of patient's condition.       Final diagnoses:  Seizure [facility])  Disorientation    ED Discharge Orders     None          Pat Lavonia LOISE, MD 09/07/24 1340  "

## 2024-09-07 NOTE — ED Notes (Signed)
 Patient transported to CT

## 2024-09-08 ENCOUNTER — Other Ambulatory Visit (HOSPITAL_COMMUNITY): Payer: Self-pay

## 2024-09-08 DIAGNOSIS — R569 Unspecified convulsions: Secondary | ICD-10-CM | POA: Diagnosis not present

## 2024-09-08 LAB — URINE DRUG SCREEN
Amphetamines: NEGATIVE
Barbiturates: NEGATIVE
Benzodiazepines: POSITIVE — AB
Cocaine: NEGATIVE
Fentanyl: NEGATIVE
Methadone Scn, Ur: NEGATIVE
Opiates: NEGATIVE
Tetrahydrocannabinol: POSITIVE — AB

## 2024-09-08 LAB — URINALYSIS, ROUTINE W REFLEX MICROSCOPIC
Bilirubin Urine: NEGATIVE
Glucose, UA: NEGATIVE mg/dL
Hgb urine dipstick: NEGATIVE
Ketones, ur: NEGATIVE mg/dL
Leukocytes,Ua: NEGATIVE
Nitrite: NEGATIVE
Protein, ur: NEGATIVE mg/dL
Specific Gravity, Urine: 1.004 — ABNORMAL LOW (ref 1.005–1.030)
pH: 6 (ref 5.0–8.0)

## 2024-09-08 LAB — CBC
HCT: 41.5 % (ref 39.0–52.0)
Hemoglobin: 13.9 g/dL (ref 13.0–17.0)
MCH: 30.3 pg (ref 26.0–34.0)
MCHC: 33.5 g/dL (ref 30.0–36.0)
MCV: 90.6 fL (ref 80.0–100.0)
Platelets: 245 10*3/uL (ref 150–400)
RBC: 4.58 MIL/uL (ref 4.22–5.81)
RDW: 12.9 % (ref 11.5–15.5)
WBC: 15.4 10*3/uL — ABNORMAL HIGH (ref 4.0–10.5)
nRBC: 0 % (ref 0.0–0.2)

## 2024-09-08 LAB — BASIC METABOLIC PANEL WITH GFR
Anion gap: 10 (ref 5–15)
Anion gap: 12 (ref 5–15)
BUN: 14 mg/dL (ref 6–20)
BUN: 13 mg/dL (ref 6–20)
CO2: 25 mmol/L (ref 22–32)
CO2: 22 mmol/L (ref 22–32)
Calcium: 9.2 mg/dL (ref 8.9–10.3)
Calcium: 8.6 mg/dL — ABNORMAL LOW (ref 8.9–10.3)
Chloride: 103 mmol/L (ref 98–111)
Chloride: 105 mmol/L (ref 98–111)
Creatinine, Ser: 1.41 mg/dL — ABNORMAL HIGH (ref 0.61–1.24)
Creatinine, Ser: 1.27 mg/dL — ABNORMAL HIGH (ref 0.61–1.24)
GFR, Estimated: >60 mL/min
GFR, Estimated: >60 mL/min
Glucose, Bld: 96 mg/dL (ref 70–99)
Glucose, Bld: 109 mg/dL — ABNORMAL HIGH (ref 70–99)
Potassium: 3.9 mmol/L (ref 3.5–5.1)
Potassium: 3.9 mmol/L (ref 3.5–5.1)
Sodium: 139 mmol/L (ref 135–145)
Sodium: 139 mmol/L (ref 135–145)

## 2024-09-08 MED ORDER — LEVETIRACETAM 500 MG PO TABS
500.0000 mg | ORAL_TABLET | Freq: Two times a day (BID) | ORAL | 0 refills | Status: AC
  Filled 2024-09-08: qty 180, 90d supply, fill #0

## 2024-09-08 MED ORDER — SODIUM CHLORIDE 0.9 % IV BOLUS
1000.0000 mL | Freq: Once | INTRAVENOUS | Status: AC
  Administered 2024-09-08: 1000 mL via INTRAVENOUS

## 2024-09-08 MED ORDER — SODIUM CHLORIDE 0.9 % IV SOLN: INTRAVENOUS | Status: DC

## 2024-09-08 NOTE — TOC Transition Note (Deleted)
 Transition of Care [facility]) - Discharge Note   Patient Details  Name: Luke Choi MRN: [id number] Date of Birth: Jul 20, 1971  [facility] of [facility] CM/SW Contact:  Corean JAYSON Canary, RN Phone Number: 09/08/2024, 9:27 AM   Clinical Narrative:     The patient has been accepted to CIR  and will DC   Final next level of care: IP Rehab Facility Barriers to Discharge: No Barriers Identified   Patient Goals and CMS Choice            Discharge Placement                       Discharge Plan and Services Additional resources added to the After Visit Summary for                                       Social Drivers of Health (SDOH) Interventions SDOH Screenings   Food Insecurity: No Food Insecurity (09/08/2024)  Housing: Low Risk (09/08/2024)  Transportation Needs: No Transportation Needs (09/08/2024)  Utilities: Not At Risk (09/08/2024)  Depression (PHQ2-9): Low Risk (12/30/2023)  Social Connections: Unknown (12/24/2021)   Received from [facility]  Tobacco Use: Medium Risk (09/07/2024)     Readmission Risk Interventions     No data to display
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
## 2024-09-08 NOTE — Plan of Care (Signed)
  Problem: Education: Goal: Knowledge of General Education information will improve Description Including pain rating scale, medication(s)/side effects and non-pharmacologic comfort measures Outcome: Progressing   Problem: Nutrition: Goal: Adequate nutrition will be maintained Outcome: Progressing   Problem: Coping: Goal: Level of anxiety will decrease Outcome: Progressing   Problem: Elimination: Goal: Will not experience complications related to bowel motility Outcome: Progressing Goal: Will not experience complications related to urinary retention Outcome: Progressing   Problem: Safety: Goal: Ability to remain free from injury will improve Outcome: Progressing   Problem: Skin Integrity: Goal: Risk for impaired skin integrity will decrease Outcome: Progressing   
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
## 2024-09-09 ENCOUNTER — Telehealth: Payer: Self-pay

## 2024-09-09 LAB — PROLACTIN: Prolactin: 16.4 ng/mL (ref 3.6–25.2)

## 2024-09-09 LAB — MISC LABCORP TEST (SEND OUT): Labcorp test code: 83935

## 2024-09-09 NOTE — Transitions of Care (Post Inpatient/ED Visit) (Signed)
" ° °  09/09/2024  Name: Luke Choi MRN: [id number] DOB: 1970/09/20  Today's TOC FU Call Status: Today's TOC FU Call Status:: Successful TOC FU Call Completed TOC FU Call Complete Date: 09/09/24  Patient's Name and Date of Birth confirmed. Name, DOB  Transition Care Management Follow-up Telephone Call Date of Discharge: 09/08/24 Discharge Facility: [facility]) Type of Discharge: Inpatient Admission Primary Inpatient Discharge Diagnosis:: convulsions How have you been since you were released from the hospital?: Better Any questions or concerns?: No  Items Reviewed: Did you receive and understand the discharge instructions provided?: Yes Medications obtained,verified, and reconciled?: Yes (Medications Reviewed) Any new allergies since your discharge?: No Dietary orders reviewed?: Yes Do you have support at home?: Yes People in Home [RPT]: spouse  Medications Reviewed Today: Medications Reviewed Today     Reviewed by Emmitt Pan, LPN (Licensed Practical Nurse) on 09/09/24 at 1138  Med List Status: <None>   Medication Order Taking? Sig Documenting Provider Last Dose Status Informant  citalopram  (CELEXA ) 10 MG tablet [id number] Yes TAKE 1 TABLET(10 MG) BY MOUTH AT BEDTIME Babs Arthea DASEN, MD  Active Spouse/Significant Other, Pharmacy Records  levETIRAcetam  (KEPPRA ) 500 MG tablet [id number] Yes Take 1 tablet (500 mg total) by mouth every 12 (twelve) hours. Krishnan, Gokul, MD  Active   magnesium  oxide (MAG-OX) 400 (240 Mg) MG tablet [id number] Yes Take 400 mg by mouth daily. [provider]  Active Spouse/Significant Other, Pharmacy Records  Melatonin 10 MG TABS [id number] Yes Take 10 mg by mouth at bedtime. [provider]  Active Spouse/Significant Other, Pharmacy Records  multivitamin (ONE-A-DAY MEN'S) TABS tablet [id number] Yes Take 1 tablet by mouth daily. [provider]  Active Spouse/Significant Other, Pharmacy Records  Potassium 99 MG TABS  [id number] Yes Take 1 tablet by mouth daily. [provider]  Active Spouse/Significant Other, Pharmacy Records  QUEtiapine  (SEROQUEL ) 100 MG tablet [id number] Yes Take 1 tablet (100 mg total) by mouth at bedtime. Babs Arthea DASEN, MD  Active Spouse/Significant Other, Pharmacy Records  VITAMIN D PO [id number] Yes Take 1 tablet by mouth daily. [provider]  Active Spouse/Significant Other, Pharmacy Records            Home Care and Equipment/Supplies: Were Home Health Services Ordered?: NA Any new equipment or medical supplies ordered?: NA  Functional Questionnaire: Do you need assistance with bathing/showering or dressing?: No Do you need assistance with meal preparation?: No Do you need assistance with eating?: No Do you have difficulty maintaining continence: No Do you need assistance with getting out of bed/getting out of a chair/moving?: No Do you have difficulty managing or taking your medications?: No  Follow up appointments reviewed: PCP Follow-up appointment confirmed?: Yes Date of PCP follow-up appointment?: 09/15/24 Follow-up Provider: [facility] Follow-up appointment confirmed?: No Reason Specialist Follow-Up Not Confirmed: Patient has Specialist Provider Number and will Call for Appointment Do you need transportation to your follow-up appointment?: No Do you understand care options if your condition(s) worsen?: Yes-patient verbalized understanding    SIGNATURE Pan Emmitt, LPN [facility] Nurse Health Advisor Direct Dial [phone]  "

## 2024-09-09 NOTE — Telephone Encounter (Signed)
 Luke Choi left a message on 09/08/2024:  He was transported from work to [facility] after a seizure. I called today for a status update. Patient did not answer the call. A message has been left to call back if needed.

## 2024-09-15 ENCOUNTER — Encounter: Payer: Self-pay | Admitting: Primary Care

## 2024-09-15 ENCOUNTER — Ambulatory Visit: Admitting: Primary Care

## 2024-09-15 VITALS — BP 126/72 | HR 73 | Temp 97.9°F | Ht 72.0 in | Wt 194.1 lb

## 2024-09-15 DIAGNOSIS — R569 Unspecified convulsions: Secondary | ICD-10-CM

## 2024-09-15 NOTE — Assessment & Plan Note (Signed)
 With recent hospitalization Hospital notes, labs, imaging reviewed.  Neuroexam today reassuring. He is aware of driving restrictions. Follow-up with neurology as recommended. Continue Keppra  500 mg twice daily

## 2024-09-15 NOTE — Progress Notes (Signed)
 "  Subjective:    Patient ID: Luke Choi, male    DOB: 27-Feb-1971, 54 y.o.   MRN: [id number]  Luke Choi is a very pleasant 54 y.o. male with a history of TBI, cognitive deficit, seizures, junctional bradycardia, hypertension, chronic DVT who presents today for hospital follow-up.  His wife joins us  today  He presented to [facility] ED on 09/07/2024 via EMS for full body convulsions.  He was at work at the time and his coworkers noticed a crash and found him seizing.  He had no prior history of seizures.  During his stay in the ED he underwent CT head and C-spine which were negative for hemorrhage or acute abnormality.  Neurology consulted and he was treated with Keppra  and Ativan .  He was admitted for further evaluation.  During his stay in the hospital he underwent EEG which was within normal limits.  And went MRI brain which was without acute intracranial abnormality.  He was noted to have multiple scattered chronic microhemorrhages suspected to be secondary to high blood pressure or prior trauma.  He was discharged home on 09/08/2024 with recommendations for outpatient neurology follow-up and driving restrictions until cleared. He was discharged home with Keppra  500 mg BID.  Since his hospital visit he's doing well and has not had seizure activity. He has an appointment with physical medicine in March and his wife will contact neurology for a follow up appointment today. He's hydrating well with water . He does have chronic daily headaches for which attributes to stress at work. He's noticed a gradual decline/change in cognitive function and is thinking about applying for disability.  There are many times at work that he will forget what he is doing.  He works in an [employer].  He has not driven since his seizure.  BP Readings from Last 3 Encounters:  09/15/24 126/72  09/08/24 115/67  06/29/24 135/86      Review of Systems  Respiratory:  Negative for shortness of breath.   Cardiovascular:   Negative for chest pain.  Gastrointestinal:  Negative for nausea and vomiting.  Neurological:  Positive for headaches. Negative for dizziness.         Past Medical History:  Diagnosis Date   Acute blood loss anemia    Chickenpox    Depression    Displaced fracture of medial malleolus of right tibia, initial encounter for closed fracture 04/22/2019   Frequent headaches    Laceration of left leg 04/21/2019   Nasogastric tube present    Pneumothorax on left 06/10/2019   Pressure injury of skin 05/17/2019   TBI (traumatic brain injury) (HCC)    Tracheostomy care [facility])     Social History   Socioeconomic History   Marital status: Married    Spouse name: Not on file   Number of children: Not on file   Years of education: Not on file   Highest education level: Not on file  Occupational History   Not on file  Tobacco Use   Smoking status: Former   Smokeless tobacco: Never   Tobacco comments:    LIGHT SMOKER IN 20'S  Vaping Use   Vaping status: Never Used  Substance and Sexual Activity   Alcohol use: Yes    Alcohol/week: 35.0 standard drinks of alcohol    Types: 35 Cans of beer per week   Drug use: Yes    Types: Marijuana   Sexual activity: Yes  Other Topics Concern   Not on file  Social History Narrative   **  Merged History Encounter **       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 Works as an engineer, maintenance.  Enjoys 4-wheeling, camping.   Social Drivers of Health   Tobacco Use: Medium Risk (09/15/2024)   Patient History    Smoking Tobacco Use: Former    Smokeless Tobacco Use: Never    Passive Exposure: Not on file  Financial Resource Strain: Not on file  Food Insecurity: No Food Insecurity (09/08/2024)   Epic    Worried About Programme Researcher, Broadcasting/film/video in the Last Year: Never true    Ran Out of Food in the Last Year: Never true  Transportation Needs: No Transportation Needs (09/08/2024)   Epic    Lack of Transportation (Medical): No    Lack of Transportation (Non-Medical): No   Physical Activity: Not on file  Stress: Not on file  Social Connections: Unknown (12/24/2021)   Received from [facility]   Social Network    Social Network: Not on file  Intimate Partner Violence: Not At Risk (09/08/2024)   Epic    Fear of Current or Ex-Partner: No    Emotionally Abused: No    Physically Abused: No    Sexually Abused: No  Depression (PHQ2-9): Low Risk (09/15/2024)   Depression (PHQ2-9)    PHQ-2 Score: 2  Alcohol Screen: Not on file  Housing: Low Risk (09/08/2024)   Epic    Unable to Pay for Housing in the Last Year: No    Number of Times Moved in the Last Year: 0    Homeless in the Last Year: No  Utilities: Not At Risk (09/08/2024)   Epic    Threatened with loss of utilities: No  Health Literacy: Not on file    Past Surgical History:  Procedure Laterality Date   CHEST TUBE INSERTION Right 06/16/2019   Procedure: Chest Tube Insertion;  Surgeon: Shyrl Linnie KIDD, MD;  Location: [facility] OR;  Service: Thoracic;  Laterality: Right;   I & D EXTREMITY Left 04/20/2019   Procedure: IRRIGATION AND DEBRIDEMENT EXTREMITY AND WOUND VAC PLACEMENT;  Surgeon: Jerri Kay HERO, MD;  Location: [facility] OR;  Service: Orthopedics;  Laterality: Left;   INCISION AND DRAINAGE Left 04/22/2019   Procedure: INCISION AND DRAINAGE Left lower leg wounds.;  Surgeon: Jerri Kay HERO, MD;  Location: [facility] OR;  Service: Orthopedics;  Laterality: Left;   INGUINAL HERNIA REPAIR     KNEE SURGERY Left    6 times   LACERATION REPAIR Left 04/20/2019   Procedure: Repair Complex Lacerations;  Surgeon: Jerri Kay HERO, MD;  Location: [facility] OR;  Service: Orthopedics;  Laterality: Left;   ORIF ANKLE FRACTURE Right 04/22/2019   Procedure: Open Reduction Internal Fixation (Orif) Medial Malleolus Fracture.;  Surgeon: Jerri Kay HERO, MD;  Location: [facility] OR;  Service: Orthopedics;  Laterality: Right;   PLEURADESIS Right 06/20/2019   Procedure: Pleuradesis;  Surgeon: Shyrl Linnie KIDD, MD;  Location: [facility] OR;  Service: Thoracic;   Laterality: Right;   VIDEO ASSISTED THORACOSCOPY (VATS)/WEDGE RESECTION Left 06/16/2019   Procedure: VIDEO ASSISTED THORACOSCOPY, wedge resection, mechanical pleurodesis;  Surgeon: Shyrl Linnie KIDD, MD;  Location: [facility] OR;  Service: Thoracic;  Laterality: Left;   VIDEO ASSISTED THORACOSCOPY (VATS)/WEDGE RESECTION Right 06/20/2019   Procedure: VIDEO ASSISTED THORACOSCOPY (VATS)/ RIGHT UPPER LOBE LUNG WEDGE RESECTION;  Surgeon: Shyrl Linnie KIDD, MD;  Location: [facility] OR;  Service: Thoracic;  Laterality: Right;    Family History  Problem Relation Age of Onset   Arthritis Mother    Hyperlipidemia Mother  Hypertension Mother    Heart disease Mother    Diabetes Mother    Alcohol abuse Father    Arthritis Father    Prostate cancer Father 13   Hyperlipidemia Father    Hypertension Father    Heart disease Father    Diabetes Father    Heart attack Father 57   Arthritis Maternal Grandmother    Arthritis Maternal Grandfather    Arthritis Paternal Grandmother    Arthritis Paternal Grandfather    Prostate cancer Paternal Uncle     Allergies[1]  Medications Ordered Prior to Encounter[2]  BP 126/72   Pulse 73   Temp 97.9 F (36.6 C) (Oral)   Ht 6' (1.829 m)   Wt 194 lb 2 oz (88.1 kg)   SpO2 96%   BMI 26.33 kg/m  Objective:   Physical Exam Eyes:     Extraocular Movements: Extraocular movements intact.  Cardiovascular:     Rate and Rhythm: Normal rate and regular rhythm.  Pulmonary:     Effort: Pulmonary effort is normal.     Breath sounds: Normal breath sounds.  Musculoskeletal:     Cervical back: Neck supple.  Skin:    General: Skin is warm and dry.  Neurological:     Mental Status: He is alert and oriented to person, place, and time.     Coordination: Coordination normal.  Psychiatric:        Mood and Affect: Mood normal.     Physical Exam        Assessment & Plan:  Seizures (HCC) Assessment & Plan: With recent hospitalization Hospital notes, labs, imaging  reviewed.  Neuroexam today reassuring. He is aware of driving restrictions. Follow-up with neurology as recommended. Continue Keppra  500 mg twice daily     Assessment and Plan Assessment & Plan         Comer MARLA Gaskins, NP       [1]  Allergies Allergen Reactions   Bee Venom Anaphylaxis and Swelling    At local site, sometimes anaphylaxis per pt!  [2]  Current Outpatient Medications on File Prior to Visit  Medication Sig Dispense Refill   citalopram  (CELEXA ) 10 MG tablet TAKE 1 TABLET(10 MG) BY MOUTH AT BEDTIME 30 tablet 5   levETIRAcetam  (KEPPRA ) 500 MG tablet Take 1 tablet (500 mg total) by mouth every 12 (twelve) hours. 180 tablet 0   magnesium  oxide (MAG-OX) 400 (240 Mg) MG tablet Take 400 mg by mouth daily.     Melatonin 10 MG TABS Take 10 mg by mouth at bedtime.     multivitamin (ONE-A-DAY MEN'S) TABS tablet Take 1 tablet by mouth daily.     Potassium 99 MG TABS Take 1 tablet by mouth daily.     QUEtiapine  (SEROQUEL ) 100 MG tablet Take 1 tablet (100 mg total) by mouth at bedtime. 30 tablet 5   VITAMIN D PO Take 1 tablet by mouth daily.     No current facility-administered medications on file prior to visit.   "

## 2024-09-15 NOTE — Patient Instructions (Signed)
 Follow-up with neurology as scheduled.  It was a pleasure to see you today!

## 2024-09-20 ENCOUNTER — Ambulatory Visit: Admitting: Neurology

## 2024-11-02 ENCOUNTER — Encounter: Admitting: Physical Medicine & Rehabilitation
